# Patient Record
Sex: Male | Born: 1945
Health system: Southern US, Community
[De-identification: ages and names within clinical notes are randomized; demographics above are authoritative.]

## PROBLEM LIST (undated history)

## (undated) DIAGNOSIS — R5383 Other fatigue: Secondary | ICD-10-CM

## (undated) DIAGNOSIS — M5412 Radiculopathy, cervical region: Secondary | ICD-10-CM

## (undated) DIAGNOSIS — F1090 Alcohol use, unspecified, uncomplicated: Secondary | ICD-10-CM

## (undated) DIAGNOSIS — M545 Low back pain, unspecified: Secondary | ICD-10-CM

## (undated) DIAGNOSIS — R251 Tremor, unspecified: Secondary | ICD-10-CM

## (undated) DIAGNOSIS — N644 Mastodynia: Secondary | ICD-10-CM

## (undated) DIAGNOSIS — I10 Essential (primary) hypertension: Secondary | ICD-10-CM

## (undated) DIAGNOSIS — G459 Transient cerebral ischemic attack, unspecified: Secondary | ICD-10-CM

## (undated) DIAGNOSIS — R0600 Dyspnea, unspecified: Secondary | ICD-10-CM

## (undated) DIAGNOSIS — E7800 Pure hypercholesterolemia, unspecified: Secondary | ICD-10-CM

## (undated) DIAGNOSIS — Z8719 Personal history of other diseases of the digestive system: Secondary | ICD-10-CM

## (undated) DIAGNOSIS — J449 Chronic obstructive pulmonary disease, unspecified: Secondary | ICD-10-CM

## (undated) DIAGNOSIS — Z860101 Personal history of adenomatous and serrated colon polyps: Secondary | ICD-10-CM

## (undated) DIAGNOSIS — N390 Urinary tract infection, site not specified: Secondary | ICD-10-CM

## (undated) DIAGNOSIS — Z86010 Personal history of colonic polyps: Secondary | ICD-10-CM

## (undated) DIAGNOSIS — D126 Benign neoplasm of colon, unspecified: Secondary | ICD-10-CM

## (undated) DIAGNOSIS — M791 Myalgia, unspecified site: Secondary | ICD-10-CM

## (undated) DIAGNOSIS — R7303 Prediabetes: Secondary | ICD-10-CM

## (undated) DIAGNOSIS — F32A Depression, unspecified: Secondary | ICD-10-CM

## (undated) DIAGNOSIS — S2239XA Fracture of one rib, unspecified side, initial encounter for closed fracture: Secondary | ICD-10-CM

## (undated) DIAGNOSIS — E669 Obesity, unspecified: Secondary | ICD-10-CM

## (undated) DIAGNOSIS — M1990 Unspecified osteoarthritis, unspecified site: Secondary | ICD-10-CM

## (undated) DIAGNOSIS — Z720 Tobacco use: Secondary | ICD-10-CM

## (undated) DIAGNOSIS — R600 Localized edema: Secondary | ICD-10-CM

## (undated) DIAGNOSIS — E785 Hyperlipidemia, unspecified: Secondary | ICD-10-CM

## (undated) DIAGNOSIS — L97309 Non-pressure chronic ulcer of unspecified ankle with unspecified severity: Secondary | ICD-10-CM

## (undated) DIAGNOSIS — G5600 Carpal tunnel syndrome, unspecified upper limb: Secondary | ICD-10-CM

## (undated) DIAGNOSIS — K921 Melena: Secondary | ICD-10-CM

## (undated) DIAGNOSIS — M25551 Pain in right hip: Secondary | ICD-10-CM

## (undated) DIAGNOSIS — L298 Other pruritus: Secondary | ICD-10-CM

## (undated) DIAGNOSIS — D7589 Other specified diseases of blood and blood-forming organs: Secondary | ICD-10-CM

## (undated) DIAGNOSIS — R079 Chest pain, unspecified: Secondary | ICD-10-CM

## (undated) DIAGNOSIS — N400 Enlarged prostate without lower urinary tract symptoms: Secondary | ICD-10-CM

## (undated) DIAGNOSIS — L299 Pruritus, unspecified: Secondary | ICD-10-CM

## (undated) DIAGNOSIS — K625 Hemorrhage of anus and rectum: Secondary | ICD-10-CM

## (undated) DIAGNOSIS — R351 Nocturia: Secondary | ICD-10-CM

## (undated) DIAGNOSIS — L2989 Other pruritus: Secondary | ICD-10-CM

## (undated) DIAGNOSIS — I700 Atherosclerosis of aorta: Secondary | ICD-10-CM

## (undated) DIAGNOSIS — R197 Diarrhea, unspecified: Secondary | ICD-10-CM

## (undated) DIAGNOSIS — R61 Generalized hyperhidrosis: Secondary | ICD-10-CM

## (undated) DIAGNOSIS — R1030 Lower abdominal pain, unspecified: Secondary | ICD-10-CM

## (undated) DIAGNOSIS — H33311 Horseshoe tear of retina without detachment, right eye: Secondary | ICD-10-CM

## (undated) DIAGNOSIS — Z789 Other specified health status: Secondary | ICD-10-CM

## (undated) DIAGNOSIS — F419 Anxiety disorder, unspecified: Secondary | ICD-10-CM

## (undated) DIAGNOSIS — M792 Neuralgia and neuritis, unspecified: Secondary | ICD-10-CM

## (undated) DIAGNOSIS — K219 Gastro-esophageal reflux disease without esophagitis: Secondary | ICD-10-CM

## (undated) DIAGNOSIS — G4730 Sleep apnea, unspecified: Secondary | ICD-10-CM

## (undated) DIAGNOSIS — R0602 Shortness of breath: Secondary | ICD-10-CM

## (undated) HISTORY — DX: Myalgia, unspecified site: M79.10

## (undated) HISTORY — DX: Other specified diseases of blood and blood-forming organs: D75.89

## (undated) HISTORY — DX: Mastodynia: N64.4

## (undated) HISTORY — DX: Tremor, unspecified: R25.1

## (undated) HISTORY — DX: Hyperlipidemia, unspecified: E78.5

## (undated) HISTORY — DX: Prediabetes: R73.03

## (undated) HISTORY — DX: Sleep apnea, unspecified: G47.30

## (undated) HISTORY — DX: Carpal tunnel syndrome, unspecified upper limb: G56.00

## (undated) HISTORY — DX: Benign prostatic hyperplasia without lower urinary tract symptoms: N40.0

## (undated) HISTORY — PX: OTHER SURGICAL HISTORY: SHX169

## (undated) HISTORY — DX: Rider (driver) (passenger) of other motorcycle injured in unspecified traffic accident, initial encounter: V29.99XA

## (undated) HISTORY — DX: Diarrhea, unspecified: R19.7

## (undated) HISTORY — DX: Personal history of adenomatous and serrated colon polyps: Z86.0101

## (undated) HISTORY — DX: Pain in right hip: M25.551

## (undated) HISTORY — DX: Other pruritus: L29.89

## (undated) HISTORY — DX: Nocturia: R35.1

## (undated) HISTORY — DX: Transient cerebral ischemic attack, unspecified: G45.9

## (undated) HISTORY — DX: Other specified health status: Z78.9

## (undated) HISTORY — DX: Generalized hyperhidrosis: R61

## (undated) HISTORY — DX: Tobacco use: Z72.0

## (undated) HISTORY — DX: Radiculopathy, cervical region: M54.12

## (undated) HISTORY — DX: Pruritus, unspecified: L29.9

## (undated) HISTORY — DX: Gastro-esophageal reflux disease without esophagitis: K21.9

## (undated) HISTORY — DX: Hemorrhage of anus and rectum: K62.5

## (undated) HISTORY — DX: Localized edema: R60.0

## (undated) HISTORY — DX: Chest pain, unspecified: R07.9

## (undated) HISTORY — DX: Horseshoe tear of retina without detachment, right eye: H33.311

## (undated) HISTORY — DX: Chronic obstructive pulmonary disease, unspecified: J44.9

## (undated) HISTORY — DX: Other pruritus: L29.8

## (undated) HISTORY — DX: Depression, unspecified: F32.A

## (undated) HISTORY — PX: UMBILICAL HERNIA REPAIR: SHX196

## (undated) HISTORY — PX: EYE SURGERY: SHX253

## (undated) HISTORY — DX: Obesity, unspecified: E66.9

## (undated) HISTORY — DX: Atherosclerosis of aorta: I70.0

## (undated) HISTORY — DX: Urinary tract infection, site not specified: N39.0

## (undated) HISTORY — DX: Alcohol use, unspecified, uncomplicated: F10.90

## (undated) HISTORY — DX: Low back pain, unspecified: M54.50

## (undated) HISTORY — DX: Melena: K92.1

## (undated) HISTORY — DX: Fracture of one rib, unspecified side, initial encounter for closed fracture: S22.39XA

## (undated) HISTORY — DX: Non-pressure chronic ulcer of unspecified ankle with unspecified severity: L97.309

## (undated) HISTORY — DX: Pure hypercholesterolemia, unspecified: E78.00

## (undated) HISTORY — DX: Anxiety disorder, unspecified: F41.9

## (undated) HISTORY — DX: Lower abdominal pain, unspecified: R10.30

## (undated) HISTORY — DX: Other fatigue: R53.83

## (undated) HISTORY — DX: Shortness of breath: R06.02

## (undated) HISTORY — DX: Neuralgia and neuritis, unspecified: M79.2

## (undated) HISTORY — DX: Essential (primary) hypertension: I10

## (undated) HISTORY — DX: Benign neoplasm of colon, unspecified: D12.6

## (undated) HISTORY — DX: Personal history of colonic polyps: Z86.010

## (undated) SURGERY — Surgical Case
Anesthesia: *Unknown

---

## 2000-09-07 ENCOUNTER — Encounter: Payer: Self-pay | Admitting: Internal Medicine

## 2000-09-07 ENCOUNTER — Encounter: Admission: RE | Admit: 2000-09-07 | Discharge: 2000-09-07 | Payer: Self-pay | Admitting: Internal Medicine

## 2002-04-16 ENCOUNTER — Ambulatory Visit (HOSPITAL_COMMUNITY): Admission: RE | Admit: 2002-04-16 | Discharge: 2002-04-16 | Payer: Self-pay | Admitting: Gastroenterology

## 2002-04-16 ENCOUNTER — Encounter (INDEPENDENT_AMBULATORY_CARE_PROVIDER_SITE_OTHER): Payer: Self-pay

## 2010-07-14 ENCOUNTER — Ambulatory Visit (HOSPITAL_COMMUNITY)
Admission: RE | Admit: 2010-07-14 | Discharge: 2010-07-14 | Payer: Self-pay | Source: Home / Self Care | Admitting: Surgery

## 2010-12-10 LAB — CBC
HCT: 48.9 % (ref 39.0–52.0)
Hemoglobin: 17 g/dL (ref 13.0–17.0)
MCH: 33.7 pg (ref 26.0–34.0)
MCHC: 34.8 g/dL (ref 30.0–36.0)
MCV: 97 fL (ref 78.0–100.0)
Platelets: 190 10*3/uL (ref 150–400)
RBC: 5.04 MIL/uL (ref 4.22–5.81)
RDW: 13.8 % (ref 11.5–15.5)
WBC: 4.8 10*3/uL (ref 4.0–10.5)

## 2010-12-10 LAB — URINALYSIS, ROUTINE W REFLEX MICROSCOPIC
Bilirubin Urine: NEGATIVE
Glucose, UA: NEGATIVE mg/dL
Hgb urine dipstick: NEGATIVE
Ketones, ur: NEGATIVE mg/dL
Nitrite: NEGATIVE
Protein, ur: NEGATIVE mg/dL
Specific Gravity, Urine: 1.023 (ref 1.005–1.030)
Urobilinogen, UA: 0.2 mg/dL (ref 0.0–1.0)
pH: 5.5 (ref 5.0–8.0)

## 2010-12-10 LAB — DIFFERENTIAL
Basophils Absolute: 0 10*3/uL (ref 0.0–0.1)
Basophils Relative: 1 % (ref 0–1)
Eosinophils Absolute: 0.1 10*3/uL (ref 0.0–0.7)
Eosinophils Relative: 2 % (ref 0–5)
Lymphocytes Relative: 18 % (ref 12–46)
Lymphs Abs: 0.8 10*3/uL (ref 0.7–4.0)
Monocytes Absolute: 0.5 10*3/uL (ref 0.1–1.0)
Monocytes Relative: 11 % (ref 3–12)
Neutro Abs: 3.3 10*3/uL (ref 1.7–7.7)
Neutrophils Relative %: 69 % (ref 43–77)

## 2010-12-10 LAB — BASIC METABOLIC PANEL
BUN: 16 mg/dL (ref 6–23)
CO2: 27 mEq/L (ref 19–32)
Calcium: 9.4 mg/dL (ref 8.4–10.5)
Chloride: 105 mEq/L (ref 96–112)
Creatinine, Ser: 0.96 mg/dL (ref 0.4–1.5)
GFR calc Af Amer: >60 mL/min (ref >60)
GFR calc non Af Amer: >60 mL/min (ref >60)
Glucose, Bld: 105 mg/dL — ABNORMAL HIGH (ref 70–99)
Potassium: 4 mEq/L (ref 3.5–5.1)
Sodium: 140 mEq/L (ref 135–145)

## 2010-12-10 LAB — PROTIME-INR
INR: 1.02 (ref 0.00–1.49)
Prothrombin Time: 13.6 seconds (ref 11.6–15.2)

## 2010-12-10 LAB — SURGICAL PCR SCREEN: Staphylococcus aureus: NEGATIVE

## 2011-02-12 NOTE — Procedures (Signed)
[facility]  Patient:    Rodney [name], Rodney [name] Visit Number: [id number] MRN: [id number]          Service Type: END Location: ENDO Attending Physician:  Dennison Bulla Ii Dictated by:   Verlin Grills, M.D. Proc. Date: 04/16/02 Admit Date:  04/16/2002 Discharge Date: 04/16/2002   CC:         [name], M.D.   Procedure Report  PROCEDURE:  Colonoscopy with polypectomy.  REFERRING PHYSICIAN:  Barbette Hair. Vaughan Basta., M.D.  INDICATION FOR PROCEDURE:  The patient is a 65 year old male with unexplained weight loss. He is due for his first screening colonoscopy with polypectomy to prevent colon cancer.  I discussed with the patient the complications associated with colonoscopy and polypectomy including a 15/1000 risk of bleeding and 12/998 risk of colon perforation requiring surgical repair. The patient has signed the operative permit.  ENDOSCOPIST:  Verlin Grills, M.D.  PREMEDICATION:  Versed 7.5 mg, Demerol 50 mg.  ENDOSCOPE:  Olympus pediatric colonoscope.  DESCRIPTION OF PROCEDURE:  After obtaining informed consent, the patient was placed in the left lateral decubitus position. I administered intravenous Demerol and intravenous Versed to achieve conscious sedation for the procedure. The patients cardiac rhythm, oxygen saturation and blood pressure were monitored throughout the procedure and documented in the medical record.  Anal inspection was normal. Digital rectal exam revealed a non-nodular prostate. The Olympus pediatric video colonoscope was introduced into the rectum and easily advanced to the cecum. A normal appearing ileocecal valve was intubated and the distal ileum inspected. Colonic preparation for the exam today was excellent.  RECTUM:  Normal.  SIGMOID COLON/DESCENDING COLON:  Normal.  SPLENIC FLEXURE:  Normal.  TRANSVERSE COLON:  Normal.  HEPATIC FLEXURE:  Normal.  ASCENDING COLON:   Normal.  CECUM/ILEOCECAL VALVE:  From the proximal cecum, a 2 mm sessile polyp was removed with the electrocautery snare and submitted for pathological interpretation.  DISTAL ILEUM:  Normal.  ASSESSMENT:  From the proximal cecum, a 2 mm sessile polyp was removed; otherwise, normal proctocolonoscopy to the cecum and distal ileoscopy.  RECOMMENDATIONS:  If colonic polyp returns neoplastic pathologically, [name] should undergo a repeat colonoscopy in five years. Dictated by:   Verlin Grills, M.D. Attending Physician:  Dennison Bulla Ii DD:  04/16/02 TD:  04/20/02 Job: [id number]

## 2012-05-16 ENCOUNTER — Other Ambulatory Visit: Payer: Self-pay | Admitting: Gastroenterology

## 2015-02-04 ENCOUNTER — Other Ambulatory Visit: Payer: Self-pay | Admitting: Internal Medicine

## 2015-02-04 DIAGNOSIS — K921 Melena: Secondary | ICD-10-CM

## 2015-02-07 ENCOUNTER — Other Ambulatory Visit: Payer: Self-pay

## 2015-02-07 ENCOUNTER — Ambulatory Visit
Admission: RE | Admit: 2015-02-07 | Discharge: 2015-02-07 | Disposition: A | Discharge status: Home / Self Care | Payer: Medicare Other | Source: Ambulatory Visit | Attending: Internal Medicine | Admitting: Internal Medicine

## 2015-02-07 DIAGNOSIS — K921 Melena: Secondary | ICD-10-CM

## 2015-07-30 ENCOUNTER — Other Ambulatory Visit: Payer: Self-pay | Admitting: Gastroenterology

## 2015-07-31 ENCOUNTER — Other Ambulatory Visit: Payer: Self-pay | Admitting: Gastroenterology

## 2015-09-09 ENCOUNTER — Encounter (HOSPITAL_COMMUNITY): Payer: Self-pay | Admitting: *Deleted

## 2015-09-18 ENCOUNTER — Ambulatory Visit (HOSPITAL_COMMUNITY)
Admission: RE | Admit: 2015-09-18 | Discharge: 2015-09-18 | Disposition: A | Discharge status: Home / Self Care | Payer: Medicare Other | Source: Ambulatory Visit | Attending: Gastroenterology | Admitting: Gastroenterology

## 2015-09-18 ENCOUNTER — Encounter (HOSPITAL_COMMUNITY): Payer: Self-pay

## 2015-09-18 ENCOUNTER — Ambulatory Visit (HOSPITAL_COMMUNITY): Payer: Medicare Other | Admitting: Certified Registered"

## 2015-09-18 ENCOUNTER — Encounter (HOSPITAL_COMMUNITY)
Admission: RE | Disposition: A | Discharge status: Home / Self Care | Payer: Self-pay | Source: Ambulatory Visit | Attending: Gastroenterology

## 2015-09-18 ENCOUNTER — Ambulatory Visit (HOSPITAL_COMMUNITY): Admit: 2015-09-18 | Payer: Self-pay | Admitting: Gastroenterology

## 2015-09-18 DIAGNOSIS — Z6831 Body mass index (BMI) 31.0-31.9, adult: Secondary | ICD-10-CM | POA: Diagnosis not present

## 2015-09-18 DIAGNOSIS — G4730 Sleep apnea, unspecified: Secondary | ICD-10-CM | POA: Insufficient documentation

## 2015-09-18 DIAGNOSIS — G4733 Obstructive sleep apnea (adult) (pediatric): Secondary | ICD-10-CM | POA: Insufficient documentation

## 2015-09-18 DIAGNOSIS — Z86010 Personal history of colonic polyps: Secondary | ICD-10-CM | POA: Diagnosis not present

## 2015-09-18 DIAGNOSIS — E669 Obesity, unspecified: Secondary | ICD-10-CM | POA: Insufficient documentation

## 2015-09-18 DIAGNOSIS — E7800 Pure hypercholesterolemia, unspecified: Secondary | ICD-10-CM | POA: Diagnosis not present

## 2015-09-18 DIAGNOSIS — R197 Diarrhea, unspecified: Secondary | ICD-10-CM | POA: Diagnosis not present

## 2015-09-18 DIAGNOSIS — I10 Essential (primary) hypertension: Secondary | ICD-10-CM | POA: Diagnosis not present

## 2015-09-18 DIAGNOSIS — M5412 Radiculopathy, cervical region: Secondary | ICD-10-CM | POA: Diagnosis not present

## 2015-09-18 DIAGNOSIS — Z79899 Other long term (current) drug therapy: Secondary | ICD-10-CM | POA: Diagnosis not present

## 2015-09-18 HISTORY — PX: COLONOSCOPY WITH PROPOFOL: SHX5780

## 2015-09-18 SURGERY — COLONOSCOPY WITH PROPOFOL
Anesthesia: Monitor Anesthesia Care

## 2015-09-18 MED ORDER — LIDOCAINE HCL (CARDIAC) 20 MG/ML IV SOLN
INTRAVENOUS | Status: DC | PRN
  Administered 2015-09-18: 50 mg via INTRAVENOUS

## 2015-09-18 MED ORDER — PROPOFOL 10 MG/ML IV BOLUS
INTRAVENOUS | Status: DC | PRN
  Administered 2015-09-18: 50 mg via INTRAVENOUS
  Administered 2015-09-18: 100 mg via INTRAVENOUS
  Administered 2015-09-18: 50 mg via INTRAVENOUS
  Administered 2015-09-18: 20 mg via INTRAVENOUS

## 2015-09-18 MED ORDER — LACTATED RINGERS IV SOLN
INTRAVENOUS | Status: DC
  Administered 2015-09-18: 1000 mL via INTRAVENOUS

## 2015-09-18 MED ORDER — PROPOFOL 10 MG/ML IV BOLUS
INTRAVENOUS | Status: AC
  Filled 2015-09-18: qty 40

## 2015-09-18 MED ORDER — LIDOCAINE HCL (CARDIAC) 20 MG/ML IV SOLN
INTRAVENOUS | Status: AC
  Filled 2015-09-18: qty 5

## 2015-09-18 SURGICAL SUPPLY — 22 items

## 2015-09-18 NOTE — Anesthesia Postprocedure Evaluation (Signed)
Anesthesia Post Note  Patient: Rodney [name]  Procedure(s) Performed: Procedure(s) (LRB): COLONOSCOPY WITH PROPOFOL (N/A)  Patient location during evaluation: PACU Anesthesia Type: MAC Level of consciousness: awake and alert Pain management: pain level controlled Vital Signs Assessment: post-procedure vital signs reviewed and stable Respiratory status: spontaneous breathing, nonlabored ventilation, respiratory function stable and patient connected to nasal cannula oxygen Cardiovascular status: stable and blood pressure returned to baseline Anesthetic complications: no    Last Vitals:  Filed Vitals:   09/18/15 1020 09/18/15 1030  BP: 163/83 150/85  Pulse: 64 60  Temp:    Resp: 18 21    Last Pain: There were no vitals filed for this visit.               Catalina Gravel

## 2015-09-18 NOTE — Discharge Instructions (Signed)
Colonoscopy, Care After °Refer to this sheet in the next few weeks. These instructions provide you with information on caring for yourself after your procedure. Your health care provider may also give you more specific instructions. Your treatment has been planned according to current medical practices, but problems sometimes occur. Call your health care provider if you have any problems or questions after your procedure. °WHAT TO EXPECT AFTER THE PROCEDURE  °After your procedure, it is typical to have the following: °· A small amount of blood in your stool. °· Moderate amounts of gas and mild abdominal cramping or bloating. °HOME CARE INSTRUCTIONS °· Do not drive, operate machinery, or sign important documents for 24 hours. °· You may shower and resume your regular physical activities, but move at a slower pace for the first 24 hours. °· Take frequent rest periods for the first 24 hours. °· Walk around or put a warm pack on your abdomen to help reduce abdominal cramping and bloating. °· Drink enough fluids to keep your urine clear or pale yellow. °· You may resume your normal diet as instructed by your health care provider. Avoid heavy or fried foods that are hard to digest. °· Avoid drinking alcohol for 24 hours or as instructed by your health care provider. °· Only take over-the-counter or prescription medicines as directed by your health care provider. °· If a tissue sample (biopsy) was taken during your procedure: °¨ Do not take aspirin or blood thinners for 7 days, or as instructed by your health care provider. °¨ Do not drink alcohol for 7 days, or as instructed by your health care provider. °¨ Eat soft foods for the first 24 hours. °SEEK MEDICAL CARE IF: °You have persistent spotting of blood in your stool 2-3 days after the procedure. °SEEK IMMEDIATE MEDICAL CARE IF: °· You have more than a small spotting of blood in your stool. °· You pass large blood clots in your stool. °· Your abdomen is swollen  (distended). °· You have nausea or vomiting. °· You have a fever. °· You have increasing abdominal pain that is not relieved with medicine. °  °This information is not intended to replace advice given to you by your health care provider. Make sure you discuss any questions you have with your health care provider. °  °Document Released: 04/27/2004 Document Revised: 07/04/2013 Document Reviewed: 05/21/2013 °Elsevier Interactive Patient Education ©2016 [employer] ° °

## 2015-09-18 NOTE — H&P (Signed)
  Problem: Chronic watery diarrhea without weight loss. Normal CBC, complete metabolic profile, thyroid stimulating hormone level. Negative screen for C. difficile colitis. One stool negative for ova and parasite and Giardia antigen. No improvement in diarrhea symptoms after empiric Metronidazole therapy. 05/16/2012 colonoscopy performed with removal of a 7 mm tubular adenomatous sigmoid colon polyp  History: The patient is a 69 year old male born 22-Feb-1946. He has had watery, nonbloody diarrhea since February 2016. He typically has up to 4 watery bowel movements each morning but rarely has bowel movements in the afternoon and does not have nocturnal diarrhea. He denies abdominal pain, fever, weight loss, or gastrointestinal bleeding. He has not traveled except to [place]. He is scheduled to undergo diagnostic colonoscopy with random colon biopsies to look for microscopic colitis.  Past medical history: Umbilical hernia repair. Adenomatous colon polyps removed colonoscopically in 2013. Hypertension. Hypercholesterolemia. Obstructive sleep apnea syndrome. Brachial neuritis. Aortic calcifications.  Medication allergies: ACE inhibitors cause cough  Exam: The patient is alert and comfortably on the endoscopy stretcher. Abdomen is soft and nontender to palpation. Lungs are clear to auscultation. Cardiac exam reveals a regular rhythm.  Plan: Proceed with diagnostic colonoscopy with random colon biopsies to look for microscopic colitis

## 2015-09-18 NOTE — Transfer of Care (Signed)
Immediate Anesthesia Transfer of Care Note  Patient: Rodney [name]  Procedure(s) Performed: Procedure(s): COLONOSCOPY WITH PROPOFOL (N/A)  Patient Location: PACU  Anesthesia Type:MAC  Level of Consciousness: awake, alert  and oriented  Airway & Oxygen Therapy: Patient Spontanous Breathing and Patient connected to face mask oxygen  Post-op Assessment: Report given to RN and Post -op Vital signs reviewed and stable  Post vital signs: Reviewed and stable  Last Vitals:  Filed Vitals:   09/18/15 0920  BP: 185/93  Pulse: 67  Temp: 36.8 C  Resp: 14    Complications: No apparent anesthesia complications

## 2015-09-18 NOTE — Anesthesia Preprocedure Evaluation (Addendum)
Anesthesia Evaluation  Patient identified by MRN, date of birth, ID band Patient awake    Reviewed: Allergy & Precautions, NPO status , Patient's Chart, lab work & pertinent test results, reviewed documented beta blocker date and time   History of Anesthesia Complications Negative for: history of anesthetic complications  Airway Mallampati: III  TM Distance: >3 FB Neck ROM: Full    Dental  (+) Teeth Intact, Dental Advisory Given   Pulmonary sleep apnea ,    Pulmonary exam normal breath sounds clear to auscultation       Cardiovascular hypertension (took atenolol last PM), Pt. on medications and Pt. on home beta blockers (-) angina(-) Past MI Normal cardiovascular exam Rhythm:Regular Rate:Normal     Neuro/Psych  Neuromuscular disease    GI/Hepatic Neg liver ROS, Diarrhea   Endo/Other  Obesity   Renal/GU negative Renal ROS     Musculoskeletal negative musculoskeletal ROS (+)   Abdominal   Peds  Hematology negative hematology ROS (+)   Anesthesia Other Findings Day of surgery medications reviewed with the patient.  Reproductive/Obstetrics                            Anesthesia Physical Anesthesia Plan  ASA: II  Anesthesia Plan: MAC   Post-op Pain Management:    Induction: Intravenous  Airway Management Planned: Nasal Cannula  Additional Equipment:   Intra-op Plan:   Post-operative Plan:   Informed Consent: I have reviewed the patients History and Physical, chart, labs and discussed the procedure including the risks, benefits and alternatives for the proposed anesthesia with the patient or authorized representative who has indicated his/her understanding and acceptance.   Dental advisory given  Plan Discussed with: CRNA and Anesthesiologist  Anesthesia Plan Comments: (Discussed risks/benefits/alternatives to MAC sedation including need for ventilatory support, hypotension,  need for conversion to general anesthesia.  All patient questions answered.  Patient wished to proceed.)        Anesthesia Quick Evaluation

## 2015-09-18 NOTE — Op Note (Signed)
Problem: Chronic watery diarrhea without weight loss. Normal CBC, complete metabolic profile, and thyroid stimulating hormone level. Screen for C. difficile colitis negative. One stool negative for ova and parasite and Giardia antigen. 05/16/2012 colonoscopy performed with removal of a 7 mm tubular adenomatous sigmoid colon polyp  Endoscopist: Earle Gell  Premedication: Propofol administered by anesthesia  Procedure: Diagnostic colonoscopy with random colon biopsies performed to look for microscopic colitis The patient was placed in the left lateral decubitus position. Anal inspection and digital rectal exam were normal. The Pentax pediatric colonoscope was introduced into the rectum and advanced to the cecum. A normal-appearing appendiceal orifice and ileocecal valve were identified. Colonic preparation for the exam today was good. Withdrawal time was 10 minutes  Rectum. Normal. Retroflex view of the distal rectum was normal  Sigmoid colon and descending colon. Normal  Splenic flexure. Normal  Transverse colon. Normal  Hepatic flexure. Normal  Ascending colon. Normal  Cecum and ileocecal valve. Normal  Random colon biopsies. Random colon biopsies were performed from the ascending colon and descending colon to look for microscopic colitis  Assessment: Normal surveillance colonoscopy. Diagnostic random colon biopsies to look for microscopic colitis pending

## 2015-09-19 ENCOUNTER — Encounter (HOSPITAL_COMMUNITY): Payer: Self-pay | Admitting: Gastroenterology

## 2015-11-15 ENCOUNTER — Encounter (HOSPITAL_COMMUNITY): Payer: Self-pay | Admitting: *Deleted

## 2015-11-15 ENCOUNTER — Emergency Department (HOSPITAL_COMMUNITY): Payer: Medicare Other

## 2015-11-15 ENCOUNTER — Inpatient Hospital Stay (HOSPITAL_COMMUNITY)
Admission: EM | Admit: 2015-11-15 | Discharge: 2015-11-25 | DRG: 200 | Disposition: A | Discharge status: Home / Self Care | Payer: Medicare Other | Attending: General Surgery | Admitting: General Surgery

## 2015-11-15 DIAGNOSIS — E785 Hyperlipidemia, unspecified: Secondary | ICD-10-CM | POA: Diagnosis present

## 2015-11-15 DIAGNOSIS — R339 Retention of urine, unspecified: Secondary | ICD-10-CM | POA: Diagnosis present

## 2015-11-15 DIAGNOSIS — K567 Ileus, unspecified: Secondary | ICD-10-CM | POA: Diagnosis not present

## 2015-11-15 DIAGNOSIS — S299XXA Unspecified injury of thorax, initial encounter: Secondary | ICD-10-CM

## 2015-11-15 DIAGNOSIS — Z8241 Family history of sudden cardiac death: Secondary | ICD-10-CM

## 2015-11-15 DIAGNOSIS — R338 Other retention of urine: Secondary | ICD-10-CM | POA: Diagnosis not present

## 2015-11-15 DIAGNOSIS — R443 Hallucinations, unspecified: Secondary | ICD-10-CM | POA: Diagnosis not present

## 2015-11-15 DIAGNOSIS — Z8249 Family history of ischemic heart disease and other diseases of the circulatory system: Secondary | ICD-10-CM

## 2015-11-15 DIAGNOSIS — R109 Unspecified abdominal pain: Secondary | ICD-10-CM

## 2015-11-15 DIAGNOSIS — S42002A Fracture of unspecified part of left clavicle, initial encounter for closed fracture: Secondary | ICD-10-CM | POA: Diagnosis present

## 2015-11-15 DIAGNOSIS — S272XXA Traumatic hemopneumothorax, initial encounter: Secondary | ICD-10-CM | POA: Diagnosis present

## 2015-11-15 DIAGNOSIS — S270XXA Traumatic pneumothorax, initial encounter: Principal | ICD-10-CM | POA: Diagnosis present

## 2015-11-15 DIAGNOSIS — R0781 Pleurodynia: Secondary | ICD-10-CM | POA: Diagnosis present

## 2015-11-15 DIAGNOSIS — I10 Essential (primary) hypertension: Secondary | ICD-10-CM | POA: Diagnosis present

## 2015-11-15 DIAGNOSIS — Z7982 Long term (current) use of aspirin: Secondary | ICD-10-CM | POA: Diagnosis not present

## 2015-11-15 DIAGNOSIS — S2242XA Multiple fractures of ribs, left side, initial encounter for closed fracture: Secondary | ICD-10-CM | POA: Diagnosis present

## 2015-11-15 DIAGNOSIS — S22009A Unspecified fracture of unspecified thoracic vertebra, initial encounter for closed fracture: Secondary | ICD-10-CM | POA: Diagnosis present

## 2015-11-15 DIAGNOSIS — N390 Urinary tract infection, site not specified: Secondary | ICD-10-CM | POA: Diagnosis not present

## 2015-11-15 DIAGNOSIS — R079 Chest pain, unspecified: Secondary | ICD-10-CM | POA: Diagnosis present

## 2015-11-15 DIAGNOSIS — Z8052 Family history of malignant neoplasm of bladder: Secondary | ICD-10-CM | POA: Diagnosis not present

## 2015-11-15 DIAGNOSIS — Z888 Allergy status to other drugs, medicaments and biological substances status: Secondary | ICD-10-CM

## 2015-11-15 DIAGNOSIS — Z9689 Presence of other specified functional implants: Secondary | ICD-10-CM

## 2015-11-15 DIAGNOSIS — G4730 Sleep apnea, unspecified: Secondary | ICD-10-CM | POA: Diagnosis present

## 2015-11-15 DIAGNOSIS — R0602 Shortness of breath: Secondary | ICD-10-CM | POA: Diagnosis present

## 2015-11-15 DIAGNOSIS — S2249XA Multiple fractures of ribs, unspecified side, initial encounter for closed fracture: Secondary | ICD-10-CM | POA: Diagnosis present

## 2015-11-15 DIAGNOSIS — S42102A Fracture of unspecified part of scapula, left shoulder, initial encounter for closed fracture: Secondary | ICD-10-CM | POA: Diagnosis present

## 2015-11-15 DIAGNOSIS — J939 Pneumothorax, unspecified: Secondary | ICD-10-CM

## 2015-11-15 DIAGNOSIS — G5622 Lesion of ulnar nerve, left upper limb: Secondary | ICD-10-CM | POA: Diagnosis present

## 2015-11-15 DIAGNOSIS — S270XXD Traumatic pneumothorax, subsequent encounter: Secondary | ICD-10-CM

## 2015-11-15 DIAGNOSIS — S42142A Displaced fracture of glenoid cavity of scapula, left shoulder, initial encounter for closed fracture: Secondary | ICD-10-CM

## 2015-11-15 DIAGNOSIS — S42152A Displaced fracture of neck of scapula, left shoulder, initial encounter for closed fracture: Secondary | ICD-10-CM

## 2015-11-15 LAB — CBC
HCT: 45 % (ref 39.0–52.0)
Hemoglobin: 15.1 g/dL (ref 13.0–17.0)
MCH: 32.8 pg (ref 26.0–34.0)
MCHC: 33.6 g/dL (ref 30.0–36.0)
MCV: 97.8 fL (ref 78.0–100.0)
PLATELETS: 242 10*3/uL (ref 150–400)
RBC: 4.6 MIL/uL (ref 4.22–5.81)
RDW: 14.3 % (ref 11.5–15.5)
WBC: 20.9 10*3/uL — AB (ref 4.0–10.5)

## 2015-11-15 LAB — PROTIME-INR
INR: 1.13 (ref 0.00–1.49)
Prothrombin Time: 14.7 seconds (ref 11.6–15.2)

## 2015-11-15 LAB — COMPREHENSIVE METABOLIC PANEL
ALK PHOS: 50 U/L (ref 38–126)
ALT: 42 U/L (ref 17–63)
AST: 52 U/L — AB (ref 15–41)
Albumin: 3.7 g/dL (ref 3.5–5.0)
Anion gap: 12 (ref 5–15)
BILIRUBIN TOTAL: 0.8 mg/dL (ref 0.3–1.2)
BUN: 15 mg/dL (ref 6–20)
CALCIUM: 8.6 mg/dL — AB (ref 8.9–10.3)
CO2: 23 mmol/L (ref 22–32)
CREATININE: 1.11 mg/dL (ref 0.61–1.24)
Chloride: 101 mmol/L (ref 101–111)
Glucose, Bld: 151 mg/dL — ABNORMAL HIGH (ref 65–99)
Potassium: 4.1 mmol/L (ref 3.5–5.1)
Sodium: 136 mmol/L (ref 135–145)
Total Protein: 6.7 g/dL (ref 6.5–8.1)

## 2015-11-15 LAB — ETHANOL

## 2015-11-15 LAB — SAMPLE TO BLOOD BANK

## 2015-11-15 LAB — MRSA PCR SCREENING: MRSA by PCR: NEGATIVE

## 2015-11-15 LAB — CDS SEROLOGY

## 2015-11-15 MED ORDER — PANTOPRAZOLE SODIUM 40 MG PO TBEC: 40.0000 mg | DELAYED_RELEASE_TABLET | Freq: Every day | ORAL | Status: DC

## 2015-11-15 MED ORDER — ATENOLOL 25 MG PO TABS
25.0000 mg | ORAL_TABLET | Freq: Every day | ORAL | Status: DC
  Administered 2015-11-15 – 2015-11-24 (×10): 25 mg via ORAL
  Filled 2015-11-15 (×10): qty 1

## 2015-11-15 MED ORDER — IPRATROPIUM-ALBUTEROL 0.5-2.5 (3) MG/3ML IN SOLN
3.0000 mL | Freq: Four times a day (QID) | RESPIRATORY_TRACT | Status: DC
  Administered 2015-11-15 – 2015-11-16 (×3): 3 mL via RESPIRATORY_TRACT
  Filled 2015-11-15 (×2): qty 3

## 2015-11-15 MED ORDER — FENTANYL CITRATE (PF) 100 MCG/2ML IJ SOLN
50.0000 ug | INTRAMUSCULAR | Status: DC | PRN
Start: 2015-11-15 — End: 2015-11-15
  Administered 2015-11-15: 50 ug via INTRAVENOUS
  Filled 2015-11-15: qty 2

## 2015-11-15 MED ORDER — CETYLPYRIDINIUM CHLORIDE 0.05 % MT LIQD
7.0000 mL | Freq: Two times a day (BID) | OROMUCOSAL | Status: DC
  Administered 2015-11-16 – 2015-11-22 (×10): 7 mL via OROMUCOSAL

## 2015-11-15 MED ORDER — IPRATROPIUM-ALBUTEROL 0.5-2.5 (3) MG/3ML IN SOLN
RESPIRATORY_TRACT | Status: AC
  Administered 2015-11-15: 3 mL via RESPIRATORY_TRACT
  Filled 2015-11-15: qty 3

## 2015-11-15 MED ORDER — PANTOPRAZOLE SODIUM 40 MG IV SOLR: 40.0000 mg | Freq: Every day | INTRAVENOUS | Status: DC

## 2015-11-15 MED ORDER — DULOXETINE HCL 60 MG PO CPEP
60.0000 mg | ORAL_CAPSULE | Freq: Every day | ORAL | Status: DC
  Administered 2015-11-15 – 2015-11-24 (×10): 60 mg via ORAL
  Filled 2015-11-15 (×10): qty 1

## 2015-11-15 MED ORDER — MIDAZOLAM HCL 2 MG/2ML IJ SOLN
INTRAMUSCULAR | Status: AC
  Filled 2015-11-15: qty 2

## 2015-11-15 MED ORDER — LIDOCAINE-EPINEPHRINE (PF) 2 %-1:200000 IJ SOLN
20.0000 mL | Freq: Once | INTRAMUSCULAR | Status: AC
  Administered 2015-11-15: 20 mL via INTRADERMAL
  Filled 2015-11-15: qty 20

## 2015-11-15 MED ORDER — IRBESARTAN 300 MG PO TABS
300.0000 mg | ORAL_TABLET | Freq: Every day | ORAL | Status: DC
  Administered 2015-11-17 – 2015-11-25 (×9): 300 mg via ORAL
  Filled 2015-11-15 (×10): qty 1

## 2015-11-15 MED ORDER — ONDANSETRON HCL 4 MG/2ML IJ SOLN
4.0000 mg | INTRAMUSCULAR | Status: DC | PRN
  Administered 2015-11-15: 4 mg via INTRAVENOUS
  Filled 2015-11-15: qty 2

## 2015-11-15 MED ORDER — MIDAZOLAM HCL 2 MG/2ML IJ SOLN
2.0000 mg | Freq: Once | INTRAMUSCULAR | Status: AC
  Administered 2015-11-15: 2 mg via INTRAVENOUS

## 2015-11-15 MED ORDER — VALSARTAN-HYDROCHLOROTHIAZIDE 320-12.5 MG PO TABS: 1.0000 | ORAL_TABLET | Freq: Every day | ORAL | Status: DC

## 2015-11-15 MED ORDER — PANTOPRAZOLE SODIUM 40 MG PO TBEC
40.0000 mg | DELAYED_RELEASE_TABLET | Freq: Every day | ORAL | Status: DC
  Administered 2015-11-16 – 2015-11-25 (×10): 40 mg via ORAL
  Filled 2015-11-15 (×10): qty 1

## 2015-11-15 MED ORDER — OXYCODONE HCL 5 MG PO TABS
5.0000 mg | ORAL_TABLET | ORAL | Status: DC | PRN
  Administered 2015-11-15 – 2015-11-18 (×3): 5 mg via ORAL
  Filled 2015-11-15 (×3): qty 1

## 2015-11-15 MED ORDER — CLONIDINE HCL 0.1 MG PO TABS
0.1000 mg | ORAL_TABLET | Freq: Every day | ORAL | Status: DC
  Administered 2015-11-15 – 2015-11-24 (×10): 0.1 mg via ORAL
  Filled 2015-11-15 (×10): qty 1

## 2015-11-15 MED ORDER — HYDROCHLOROTHIAZIDE 12.5 MG PO CAPS
12.5000 mg | ORAL_CAPSULE | Freq: Every day | ORAL | Status: DC
  Administered 2015-11-17 – 2015-11-25 (×9): 12.5 mg via ORAL
  Filled 2015-11-15 (×9): qty 1

## 2015-11-15 MED ORDER — SODIUM CHLORIDE 0.9 % IV BOLUS (SEPSIS): 125.0000 mL | Freq: Once | INTRAVENOUS | Status: DC

## 2015-11-15 MED ORDER — ONDANSETRON HCL 4 MG/2ML IJ SOLN
4.0000 mg | Freq: Four times a day (QID) | INTRAMUSCULAR | Status: DC | PRN
Start: 2015-11-15 — End: 2015-11-25

## 2015-11-15 MED ORDER — FENTANYL CITRATE (PF) 100 MCG/2ML IJ SOLN
50.0000 ug | INTRAMUSCULAR | Status: DC | PRN
  Administered 2015-11-15 – 2015-11-18 (×13): 100 ug via INTRAVENOUS
  Filled 2015-11-15 (×13): qty 2

## 2015-11-15 MED ORDER — AMLODIPINE BESYLATE 10 MG PO TABS
10.0000 mg | ORAL_TABLET | Freq: Every day | ORAL | Status: DC
  Administered 2015-11-17 – 2015-11-25 (×9): 10 mg via ORAL
  Filled 2015-11-15 (×9): qty 1

## 2015-11-15 MED ORDER — POLYVINYL ALCOHOL 1.4 % OP SOLN
1.0000 [drp] | Freq: Three times a day (TID) | OPHTHALMIC | Status: DC | PRN
Start: 2015-11-15 — End: 2015-11-25
  Filled 2015-11-15: qty 15

## 2015-11-15 MED ORDER — OXYCODONE HCL 5 MG PO TABS
10.0000 mg | ORAL_TABLET | ORAL | Status: DC | PRN
  Administered 2015-11-15 – 2015-11-18 (×9): 10 mg via ORAL
  Filled 2015-11-15 (×9): qty 2

## 2015-11-15 MED ORDER — IOHEXOL 300 MG/ML  SOLN
100.0000 mL | Freq: Once | INTRAMUSCULAR | Status: AC | PRN
  Administered 2015-11-15: 100 mL via INTRAVENOUS

## 2015-11-15 MED ORDER — ALPRAZOLAM 0.25 MG PO TABS: 0.2500 mg | ORAL_TABLET | Freq: Every evening | ORAL | Status: DC | PRN

## 2015-11-15 MED ORDER — POLYETHYL GLYCOL-PROPYL GLYCOL 0.4-0.3 % OP GEL: 1.0000 "application " | Freq: Three times a day (TID) | OPHTHALMIC | Status: DC | PRN

## 2015-11-15 MED ORDER — ONDANSETRON HCL 4 MG PO TABS: 4.0000 mg | ORAL_TABLET | Freq: Four times a day (QID) | ORAL | Status: DC | PRN

## 2015-11-15 MED ORDER — KCL IN DEXTROSE-NACL 20-5-0.45 MEQ/L-%-% IV SOLN
INTRAVENOUS | Status: DC
  Administered 2015-11-15 – 2015-11-17 (×4): via INTRAVENOUS
  Filled 2015-11-15 (×4): qty 1000

## 2015-11-15 NOTE — ED Notes (Signed)
Trauma MD at bedside.
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
## 2015-11-15 NOTE — ED Notes (Signed)
Pt transported to CT with RN on monitor
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
## 2015-11-15 NOTE — Progress Notes (Signed)
Orthopedic Tech Progress Note Patient Details:  Rodney [name] 01/09/1946 HC:[id number]  Ortho Devices Type of Ortho Device: Arm sling Ortho Device/Splint Interventions: Application   [place] Pink 11/15/2015, 12:31 PM
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
## 2015-11-15 NOTE — ED Notes (Signed)
4L applied O2
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
## 2015-11-15 NOTE — ED Notes (Signed)
MD at bedside. 

## 2015-11-15 NOTE — ED Notes (Signed)
X-ray at bedside
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
## 2015-11-15 NOTE — ED Notes (Signed)
1 L NS ruunning, verbal per [employer]

## 2015-11-15 NOTE — Procedures (Signed)
Chest Tube Insertion Procedure Note  Pre-operative Diagnosis: L Pneumothorax  Post-operative Diagnosis:  L Pneumothorax  Procedure Details  Informed consent was obtained for the procedure, including sedation.  Risks of lung perforation, hemorrhage, arrhythmia, and adverse drug reaction were discussed.   After sterile skin prep, using standard technique, a 20 French tube was placed in the left anterior axillary line, nipple level. Sutured in place.  Findings: large rush of air  Estimated Blood Loss:  less than 100 mL         Specimens:  None              Complications:  None; patient tolerated the procedure well.         Condition: stable  Georganna Skeans, MD, MPH, FACS Trauma: [phone] General Surgery: [phone]

## 2015-11-15 NOTE — ED Notes (Signed)
Chest tube placed, left side via MD Georganna Skeans.  Pt tolerated well.

## 2015-11-15 NOTE — ED Notes (Signed)
Attempted report 

## 2015-11-15 NOTE — ED Notes (Signed)
Md aware pt O2 83-88 on RA,  3L applied, O2 89-92%.

## 2015-11-16 ENCOUNTER — Inpatient Hospital Stay (HOSPITAL_COMMUNITY): Payer: Medicare Other

## 2015-11-16 LAB — BASIC METABOLIC PANEL
ANION GAP: 11 (ref 5–15)
BUN: 16 mg/dL (ref 6–20)
CALCIUM: 8.5 mg/dL — AB (ref 8.9–10.3)
CHLORIDE: 102 mmol/L (ref 101–111)
CO2: 25 mmol/L (ref 22–32)
CREATININE: 0.95 mg/dL (ref 0.61–1.24)
GFR calc non Af Amer: >60 mL/min (ref >60)
GLUCOSE: 135 mg/dL — AB (ref 65–99)
Potassium: 4.3 mmol/L (ref 3.5–5.1)
Sodium: 138 mmol/L (ref 135–145)

## 2015-11-16 LAB — CBC
HEMATOCRIT: 39.1 % (ref 39.0–52.0)
HEMOGLOBIN: 12.8 g/dL — AB (ref 13.0–17.0)
MCH: 32.5 pg (ref 26.0–34.0)
MCHC: 32.7 g/dL (ref 30.0–36.0)
MCV: 99.2 fL (ref 78.0–100.0)
Platelets: 218 10*3/uL (ref 150–400)
RBC: 3.94 MIL/uL — ABNORMAL LOW (ref 4.22–5.81)
RDW: 14.4 % (ref 11.5–15.5)
WBC: 9.1 10*3/uL (ref 4.0–10.5)

## 2015-11-16 MED ORDER — KETOROLAC TROMETHAMINE 30 MG/ML IJ SOLN
15.0000 mg | Freq: Three times a day (TID) | INTRAMUSCULAR | Status: AC
  Administered 2015-11-16 – 2015-11-18 (×5): 15 mg via INTRAVENOUS
  Filled 2015-11-16 (×5): qty 1

## 2015-11-16 MED ORDER — TAMSULOSIN HCL 0.4 MG PO CAPS
0.4000 mg | ORAL_CAPSULE | Freq: Every day | ORAL | Status: DC
  Administered 2015-11-16 – 2015-11-20 (×5): 0.4 mg via ORAL
  Filled 2015-11-16 (×5): qty 1

## 2015-11-16 MED ORDER — KETOROLAC TROMETHAMINE 30 MG/ML IJ SOLN
30.0000 mg | Freq: Three times a day (TID) | INTRAMUSCULAR | Status: DC
  Administered 2015-11-16: 30 mg via INTRAVENOUS
  Filled 2015-11-16: qty 1

## 2015-11-16 MED ORDER — ENOXAPARIN SODIUM 40 MG/0.4ML ~~LOC~~ SOLN
40.0000 mg | SUBCUTANEOUS | Status: DC
  Administered 2015-11-16 – 2015-11-18 (×3): 40 mg via SUBCUTANEOUS
  Filled 2015-11-16 (×3): qty 0.4

## 2015-11-16 MED ORDER — IPRATROPIUM-ALBUTEROL 0.5-2.5 (3) MG/3ML IN SOLN
3.0000 mL | Freq: Three times a day (TID) | RESPIRATORY_TRACT | Status: DC
  Administered 2015-11-16 – 2015-11-19 (×7): 3 mL via RESPIRATORY_TRACT
  Filled 2015-11-16 (×7): qty 3

## 2015-11-16 MED ORDER — BETHANECHOL CHLORIDE 10 MG PO TABS
10.0000 mg | ORAL_TABLET | Freq: Three times a day (TID) | ORAL | Status: DC
  Administered 2015-11-16 – 2015-11-20 (×12): 10 mg via ORAL
  Filled 2015-11-16 (×12): qty 1

## 2015-11-16 MED ORDER — IPRATROPIUM-ALBUTEROL 0.5-2.5 (3) MG/3ML IN SOLN
3.0000 mL | Freq: Four times a day (QID) | RESPIRATORY_TRACT | Status: DC
  Administered 2015-11-16 (×2): 3 mL via RESPIRATORY_TRACT
  Filled 2015-11-16 (×2): qty 3
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
## 2015-11-16 NOTE — Progress Notes (Signed)
Patient ID: Rodney [name], male   DOB: 1946-05-03, 70 y.o.   MRN: [id number] Will order CT scan of the left shoulder to evaluate the extent of his scapula fracture.  Can work on motion of left elbow and wrist, but not shoulder for now.  PT currently at beside.  Don't want him to put weight thru the shoulder for now.  He is NVI with the left arm.
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
## 2015-11-17 ENCOUNTER — Inpatient Hospital Stay (HOSPITAL_COMMUNITY): Payer: Medicare Other

## 2015-11-17 LAB — CBC
HEMATOCRIT: 35.3 % — AB (ref 39.0–52.0)
Hemoglobin: 11.3 g/dL — ABNORMAL LOW (ref 13.0–17.0)
MCH: 32 pg (ref 26.0–34.0)
MCHC: 32 g/dL (ref 30.0–36.0)
MCV: 100 fL (ref 78.0–100.0)
Platelets: 186 10*3/uL (ref 150–400)
RBC: 3.53 MIL/uL — ABNORMAL LOW (ref 4.22–5.81)
RDW: 14.3 % (ref 11.5–15.5)
WBC: 9.1 10*3/uL (ref 4.0–10.5)

## 2015-11-17 NOTE — Progress Notes (Signed)
Patient ID: Rodney [name], male   DOB: January 02, 1946, 70 y.o.   MRN: [id number] I reviewed the left shoulder dedicated CT scan.  Will continue non-operative treatment for now.  The main fracture involves the inferior-posterior glenoid and his shoulder appears well located and not subluxed.  May end up with non-operative treatment for this injury.
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
## 2015-11-18 ENCOUNTER — Inpatient Hospital Stay (HOSPITAL_COMMUNITY): Payer: Medicare Other

## 2015-11-18 LAB — URINE MICROSCOPIC-ADD ON

## 2015-11-18 LAB — URINALYSIS, ROUTINE W REFLEX MICROSCOPIC
Bilirubin Urine: NEGATIVE
Glucose, UA: NEGATIVE mg/dL
KETONES UR: NEGATIVE mg/dL
NITRITE: NEGATIVE
PROTEIN: NEGATIVE mg/dL
Specific Gravity, Urine: 1.015 (ref 1.005–1.030)
pH: 7.5 (ref 5.0–8.0)

## 2015-11-18 LAB — BASIC METABOLIC PANEL
ANION GAP: 11 (ref 5–15)
BUN: 21 mg/dL — ABNORMAL HIGH (ref 6–20)
CALCIUM: 8.6 mg/dL — AB (ref 8.9–10.3)
CO2: 23 mmol/L (ref 22–32)
Chloride: 99 mmol/L — ABNORMAL LOW (ref 101–111)
Creatinine, Ser: 0.98 mg/dL (ref 0.61–1.24)
Glucose, Bld: 116 mg/dL — ABNORMAL HIGH (ref 65–99)
Potassium: 4.6 mmol/L (ref 3.5–5.1)
Sodium: 133 mmol/L — ABNORMAL LOW (ref 135–145)

## 2015-11-18 LAB — CBC
HEMATOCRIT: 33.2 % — AB (ref 39.0–52.0)
Hemoglobin: 11 g/dL — ABNORMAL LOW (ref 13.0–17.0)
MCH: 32.5 pg (ref 26.0–34.0)
MCHC: 33.1 g/dL (ref 30.0–36.0)
MCV: 98.2 fL (ref 78.0–100.0)
PLATELETS: 211 10*3/uL (ref 150–400)
RBC: 3.38 MIL/uL — ABNORMAL LOW (ref 4.22–5.81)
RDW: 14.1 % (ref 11.5–15.5)
WBC: 10.7 10*3/uL — AB (ref 4.0–10.5)

## 2015-11-18 MED ORDER — OXYCODONE HCL 5 MG PO TABS
5.0000 mg | ORAL_TABLET | ORAL | Status: DC | PRN
  Administered 2015-11-18: 5 mg via ORAL
  Administered 2015-11-19: 15 mg via ORAL
  Administered 2015-11-19 (×2): 10 mg via ORAL
  Administered 2015-11-20: 15 mg via ORAL
  Administered 2015-11-21 (×2): 10 mg via ORAL
  Administered 2015-11-21: 15 mg via ORAL
  Administered 2015-11-22: 10 mg via ORAL
  Filled 2015-11-18 (×2): qty 2
  Filled 2015-11-18: qty 3
  Filled 2015-11-18: qty 1
  Filled 2015-11-18: qty 2
  Filled 2015-11-18 (×2): qty 3
  Filled 2015-11-18 (×2): qty 2

## 2015-11-18 MED ORDER — HYDROMORPHONE HCL 1 MG/ML IJ SOLN: 1.0000 mg | INTRAMUSCULAR | Status: DC | PRN

## 2015-11-18 MED ORDER — SODIUM CHLORIDE 0.9% FLUSH
10.0000 mL | INTRAVENOUS | Status: DC | PRN
  Administered 2015-11-21: 10 mL
  Administered 2015-11-23: 20 mL
  Filled 2015-11-18 (×2): qty 40

## 2015-11-18 MED ORDER — NAPROXEN 250 MG PO TABS
500.0000 mg | ORAL_TABLET | Freq: Two times a day (BID) | ORAL | Status: DC
Start: 2015-11-18 — End: 2015-11-25
  Administered 2015-11-18 – 2015-11-25 (×14): 500 mg via ORAL
  Filled 2015-11-18 (×15): qty 2

## 2015-11-18 MED ORDER — TRAMADOL HCL 50 MG PO TABS
100.0000 mg | ORAL_TABLET | Freq: Four times a day (QID) | ORAL | Status: DC
Start: 2015-11-18 — End: 2015-11-20
  Administered 2015-11-18 – 2015-11-20 (×8): 100 mg via ORAL
  Filled 2015-11-18 (×8): qty 2

## 2015-11-18 NOTE — Care Management Important Message (Signed)
Important Message  Patient Details  Name: Rodney [name] MRN: [id number] Date of Birth: 1946/08/24   Medicare Important Message Given:  Yes    Nathen May 11/18/2015, 2:26 PM
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
## 2015-11-18 NOTE — Progress Notes (Signed)
Pt began to desat around 0300 after pt tried to get up and urinate. Patient was on nasal cannula at 3L. Nurse assisted patient with getting cleaned up and back in the bed. After repositioning and getting back in the bed pt began to desat to the 80's. VSS are stable, patient states that he is a little short of breath from moving around in the bed. Nurse raised O2 to 6L, pt stayed in the 80's. Nurse put the pt on the Venturi Mask at 8L and pt is currently satting at 94%. Currently pt is unable to tolerate the removal of the mask.  Julene Rahn,RN  11/18/2015

## 2015-11-18 NOTE — Progress Notes (Signed)
Peripherally Inserted Central Catheter/Midline Placement  The IV Nurse has discussed with the patient and/or persons authorized to consent for the patient, the purpose of this procedure and the potential benefits and risks involved with this procedure.  The benefits include less needle sticks, lab draws from the catheter and patient may be discharged home with the catheter.  Risks include, but not limited to, infection, bleeding, blood clot (thrombus formation), and puncture of an artery; nerve damage and irregular heat beat.  Alternatives to this procedure were also discussed.  PICC/Midline Placement Documentation  PICC Single Lumen [id number] PICC Right Basilic 45 cm 0 cm (Active)  Indication for Insertion or Continuance of Line Limited venous access - need for IV therapy >5 days (PICC only);Poor Vasculature-patient has had multiple peripheral attempts or PIVs lasting less than 24 hours 11/18/2015  4:27 PM  Exposed Catheter (cm) 0 cm 11/18/2015  4:27 PM  Dressing Change Due 11/25/15 11/18/2015  4:27 PM    1515: Patient A&O but wife signed consent per patient preference.    Catalina Pizza 11/18/2015, 4:27 PM

## 2015-11-19 ENCOUNTER — Inpatient Hospital Stay (HOSPITAL_COMMUNITY): Payer: Medicare Other

## 2015-11-19 DIAGNOSIS — S22009A Unspecified fracture of unspecified thoracic vertebra, initial encounter for closed fracture: Secondary | ICD-10-CM | POA: Diagnosis present

## 2015-11-19 DIAGNOSIS — R339 Retention of urine, unspecified: Secondary | ICD-10-CM | POA: Diagnosis present

## 2015-11-19 DIAGNOSIS — S42102A Fracture of unspecified part of scapula, left shoulder, initial encounter for closed fracture: Secondary | ICD-10-CM | POA: Diagnosis present

## 2015-11-19 DIAGNOSIS — S42002A Fracture of unspecified part of left clavicle, initial encounter for closed fracture: Secondary | ICD-10-CM | POA: Diagnosis present

## 2015-11-19 DIAGNOSIS — S272XXA Traumatic hemopneumothorax, initial encounter: Secondary | ICD-10-CM | POA: Diagnosis present

## 2015-11-19 DIAGNOSIS — N390 Urinary tract infection, site not specified: Secondary | ICD-10-CM | POA: Diagnosis not present

## 2015-11-19 MED ORDER — DOCUSATE SODIUM 100 MG PO CAPS
100.0000 mg | ORAL_CAPSULE | Freq: Two times a day (BID) | ORAL | Status: DC
  Administered 2015-11-19 – 2015-11-21 (×5): 100 mg via ORAL
  Filled 2015-11-19 (×6): qty 1

## 2015-11-19 MED ORDER — POLYETHYLENE GLYCOL 3350 17 G PO PACK
17.0000 g | PACK | Freq: Every day | ORAL | Status: DC
  Administered 2015-11-19 – 2015-11-24 (×6): 17 g via ORAL
  Filled 2015-11-19 (×7): qty 1

## 2015-11-19 MED ORDER — ENOXAPARIN SODIUM 30 MG/0.3ML ~~LOC~~ SOLN
30.0000 mg | Freq: Two times a day (BID) | SUBCUTANEOUS | Status: DC
  Administered 2015-11-19 – 2015-11-24 (×12): 30 mg via SUBCUTANEOUS
  Filled 2015-11-19 (×12): qty 0.3

## 2015-11-19 MED ORDER — SULFAMETHOXAZOLE-TRIMETHOPRIM 800-160 MG PO TABS
1.0000 | ORAL_TABLET | Freq: Two times a day (BID) | ORAL | Status: DC
  Administered 2015-11-19 – 2015-11-23 (×10): 1 via ORAL
  Filled 2015-11-19 (×10): qty 1

## 2015-11-19 MED ORDER — IPRATROPIUM-ALBUTEROL 0.5-2.5 (3) MG/3ML IN SOLN: 3.0000 mL | RESPIRATORY_TRACT | Status: DC | PRN

## 2015-11-19 MED ORDER — HYDROMORPHONE HCL 1 MG/ML IJ SOLN
0.5000 mg | INTRAMUSCULAR | Status: DC | PRN
  Filled 2015-11-19: qty 1

## 2015-11-19 MED ORDER — MUSCLE RUB 10-15 % EX CREA
TOPICAL_CREAM | CUTANEOUS | Status: DC | PRN
Start: 2015-11-19 — End: 2015-11-25
  Administered 2015-11-21: 1 via TOPICAL
  Filled 2015-11-19 (×2): qty 85
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
## 2015-11-20 ENCOUNTER — Inpatient Hospital Stay (HOSPITAL_COMMUNITY): Payer: Medicare Other

## 2015-11-20 DIAGNOSIS — G5622 Lesion of ulnar nerve, left upper limb: Secondary | ICD-10-CM | POA: Diagnosis present

## 2015-11-20 DIAGNOSIS — R338 Other retention of urine: Secondary | ICD-10-CM | POA: Diagnosis not present

## 2015-11-20 MED ORDER — PREGABALIN 75 MG PO CAPS
75.0000 mg | ORAL_CAPSULE | Freq: Two times a day (BID) | ORAL | Status: DC
  Administered 2015-11-20 – 2015-11-23 (×8): 75 mg via ORAL
  Filled 2015-11-20 (×8): qty 1

## 2015-11-20 NOTE — Progress Notes (Signed)
Physician notified: Hulen Skains At: 1722  Regarding: Pt unable to void. Bladder scan shows 436ml in bladder, no complaints of pain or pressure. No order for I&O or foley.  Awaiting return response.   Returned Response at: 1725  Order(s): Straight cath every 6-8 hours as needed for retention.
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
## 2015-11-21 ENCOUNTER — Inpatient Hospital Stay (HOSPITAL_COMMUNITY): Payer: Medicare Other

## 2015-11-21 LAB — CBC
HEMATOCRIT: 32 % — AB (ref 39.0–52.0)
HEMOGLOBIN: 10.8 g/dL — AB (ref 13.0–17.0)
MCH: 33.8 pg (ref 26.0–34.0)
MCHC: 33.8 g/dL (ref 30.0–36.0)
MCV: 100 fL (ref 78.0–100.0)
Platelets: 249 10*3/uL (ref 150–400)
RBC: 3.2 MIL/uL — AB (ref 4.22–5.81)
RDW: 14.3 % (ref 11.5–15.5)
WBC: 10.6 10*3/uL — ABNORMAL HIGH (ref 4.0–10.5)

## 2015-11-21 MED ORDER — MAGNESIUM CITRATE PO SOLN
1.0000 | Freq: Once | ORAL | Status: AC
  Administered 2015-11-21: 1 via ORAL
  Filled 2015-11-21: qty 296

## 2015-11-21 MED ORDER — DOCUSATE SODIUM 100 MG PO CAPS
200.0000 mg | ORAL_CAPSULE | Freq: Two times a day (BID) | ORAL | Status: DC
  Administered 2015-11-21 – 2015-11-25 (×9): 200 mg via ORAL
  Filled 2015-11-21 (×9): qty 2

## 2015-11-21 NOTE — Care Management Important Message (Signed)
Important Message  Patient Details  Name: Rodney [name] MRN: [id number] Date of Birth: 1946-03-03   Medicare Important Message Given:  Yes    Nathen May 11/21/2015, 11:52 AM
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
## 2015-11-21 NOTE — Progress Notes (Signed)
Report called pt to transfer to 6N04 via w/c with belongings, wife called notified her of new room number.

## 2015-11-22 ENCOUNTER — Inpatient Hospital Stay (HOSPITAL_COMMUNITY): Payer: Medicare Other

## 2015-11-22 MED ORDER — TRAMADOL HCL 50 MG PO TABS
100.0000 mg | ORAL_TABLET | Freq: Two times a day (BID) | ORAL | Status: DC | PRN
Start: 2015-11-22 — End: 2015-11-25
  Administered 2015-11-23: 100 mg via ORAL
  Filled 2015-11-22: qty 2

## 2015-11-22 MED ORDER — HYDROMORPHONE HCL 1 MG/ML IJ SOLN: 0.5000 mg | INTRAMUSCULAR | Status: DC | PRN

## 2015-11-22 MED ORDER — BISACODYL 10 MG RE SUPP
10.0000 mg | Freq: Once | RECTAL | Status: AC
  Administered 2015-11-22: 10 mg via RECTAL
  Filled 2015-11-22: qty 1

## 2015-11-22 MED ORDER — OXYCODONE HCL 5 MG PO TABS
5.0000 mg | ORAL_TABLET | ORAL | Status: DC | PRN
  Administered 2015-11-22 – 2015-11-25 (×11): 10 mg via ORAL
  Filled 2015-11-22 (×11): qty 2

## 2015-11-22 MED ORDER — OXYCODONE HCL 5 MG PO TABS: 10.0000 mg | ORAL_TABLET | ORAL | Status: DC | PRN

## 2015-11-22 MED ORDER — POLYETHYLENE GLYCOL 3350 17 G PO PACK: 17.0000 g | PACK | Freq: Every day | ORAL | Status: DC

## 2015-11-22 NOTE — Progress Notes (Signed)
Pt and wife said that Pt been having dreams, ?hallucinations. Started in  the Unit he came from. Has another episode tonight, said that he is wide awake and have seen the TV and the clock fell off the wall and the bed tipped over. Wife is present during this time.

## 2015-11-22 NOTE — Progress Notes (Signed)
Clarified order for chest tube removal, inform MD that staff on this floor is not trained for such . Md stated they will check it in am.
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
## 2015-11-23 ENCOUNTER — Inpatient Hospital Stay (HOSPITAL_COMMUNITY): Payer: Medicare Other

## 2015-11-23 LAB — BASIC METABOLIC PANEL
Anion gap: 7 (ref 5–15)
BUN: 15 mg/dL (ref 6–20)
CHLORIDE: 102 mmol/L (ref 101–111)
CO2: 27 mmol/L (ref 22–32)
Calcium: 8.8 mg/dL — ABNORMAL LOW (ref 8.9–10.3)
Creatinine, Ser: 0.93 mg/dL (ref 0.61–1.24)
GFR calc Af Amer: >60 mL/min (ref >60)
GFR calc non Af Amer: >60 mL/min (ref >60)
Glucose, Bld: 108 mg/dL — ABNORMAL HIGH (ref 65–99)
POTASSIUM: 4.6 mmol/L (ref 3.5–5.1)
SODIUM: 136 mmol/L (ref 135–145)

## 2015-11-23 LAB — URINE CULTURE: Culture: >=100000

## 2015-11-23 NOTE — Progress Notes (Signed)
Patient BP 186/72. Dr. Gershon Crane notified.  Pain medicine given . Will continue to monitor patient
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
## 2015-11-24 ENCOUNTER — Inpatient Hospital Stay (HOSPITAL_COMMUNITY): Payer: Medicare Other

## 2015-11-24 NOTE — Progress Notes (Signed)
Patient ID: Hervie Monjaraz, male   DOB: [birth year], 69 y.o.   MRN: [id number]   LOS: 9 days   Subjective: Having vivid dreams and some myoclonic jerking that will wake him up.   Objective: Vital signs in last 24 hours: Temp:  [97.5 F (36.4 C)-97.9 F (36.6 C)] 97.8 F (36.6 C) (02/27 0620) Pulse Rate:  [58-73] 58 (02/27 0620) Resp:  [17-19] 17 (02/27 0620) BP: (156-186)/(60-73) 156/60 mmHg (02/27 0620) SpO2:  [94 %-97 %] 94 % (02/27 0620) Last BM Date: 11/22/15   Radiology Results CXR: [facility] (official read pending)   Physical Exam General appearance: alert and no distress Resp: clear to auscultation bilaterally and SQE left Cardio: regular rate and rhythm GI: normal findings: bowel sounds normal and soft, non-tender Extremities: NVI   Assessment/Plan: [facility] L scapula and clavicle FX - Non-operative per Dr. Ninfa Linden, in sling Left ulnar neuropathy -- On lyrica, unchanged so far so will d/c as it could be causing the myoclonus Mult L rib FX with PTX - CXR unchanged T2 TVP FX pain control UTI -- Proteus, sensitive to bactrim on Septra D6, will d/c FEN - continue daily miralax VTE -- SCD's, Lovenox Dispo - Home today after PT/OT    Lisette Abu, PA-C Pager: [phone] General Trauma PA Pager: [phone]  11/24/2015

## 2015-11-24 NOTE — Care Management Important Message (Signed)
Important Message  Patient Details  Name: Rodney [name] MRN: [id number] Date of Birth: 10-12-1945   Medicare Important Message Given:  Yes    Barb Merino Akhila Mahnken 11/24/2015, 4:57 PM
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
## 2015-11-25 MED ORDER — NAPROXEN 500 MG PO TABS: 500.0000 mg | ORAL_TABLET | Freq: Two times a day (BID) | ORAL | Status: DC

## 2015-11-25 MED ORDER — OXYCODONE-ACETAMINOPHEN 5-325 MG PO TABS: 1.0000 | ORAL_TABLET | ORAL | Status: DC | PRN

## 2015-11-25 NOTE — Care Management Note (Signed)
Case Management Note  Patient Details  Name: Rodney [name] MRN: [id number] Date of Birth: [birth year]  Subjective/Objective:    Pt medically stable for dc home today with spouse.  He will need DME and HH follow up.                  Action/Plan: Met with pt and wife to discuss dc arrangements.  Referral to [facility] for [facility] needs, per pt /wife choice.  Start of care 24-48h post dc date.  3 in 1 and cane delivered to pt's room prior to dc.    Expected Discharge Date:    11/25/15              Expected Discharge Plan:  [name]  In-House Referral:  Clinical Social Work  Discharge planning Services  CM Consult  Post Acute Care Choice:  Home Health Choice offered to:  Patient, Spouse  DME Arranged:  3-N-1, [name] DME Agency:  [place]:  PT [place]:  [place]  Status of Service:  Completed, signed off  Medicare Important Message Given:  Yes Date Medicare IM Given:    Medicare IM give by:    Date Additional Medicare IM Given:    Additional Medicare Important Message give by:     If discussed at Ages of Stay Meetings, dates discussed:    Additional Comments:  Reinaldo Raddle, RN, BSN  Trauma/Neuro ICU Case Manager [phone]
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
## 2015-11-25 NOTE — Discharge Instructions (Signed)
No driving while taking oxycodone.  Keep arm in sling while upright. No lifting, pushing, or pulling.  See your primary care provider to be cleared for driving/riding again.
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
## 2015-11-25 NOTE — Progress Notes (Signed)
Patient ID: Rodney [name], male   DOB: [birth year], 70 y.o.   MRN: [id number]   LOS: 10 days   Subjective: Doing well, ready to go home.   Objective: Vital signs in last 24 hours: Temp:  [97.7 F (36.5 C)-97.9 F (36.6 C)] 97.9 F (36.6 C) (02/28 0440) Pulse Rate:  [58-68] 58 (02/28 0440) Resp:  [17-18] 17 (02/28 0440) BP: (154-164)/(57-68) 154/57 mmHg (02/28 0440) SpO2:  [94 %-98 %] 96 % (02/28 0440) Last BM Date: 11/24/15   IS: 1267ml (+272ml)   Physical Exam General appearance: alert and no distress Resp: clear to auscultation bilaterally Cardio: regular rate and rhythm GI: normal findings: bowel sounds normal and soft, non-tender   Assessment/Plan: [facility] L scapula and clavicle FX - Non-operative per Dr. Ninfa Linden, in sling Left ulnar neuropathy  Mult L rib FX with PTX - CXR unchanged T2 TVP FX  Dispo - Home today after PT/OT    Lisette Abu, PA-C Pager: [phone] General Trauma PA Pager: [phone]  11/25/2015

## 2016-04-13 ENCOUNTER — Other Ambulatory Visit (HOSPITAL_COMMUNITY): Payer: Self-pay | Admitting: Internal Medicine

## 2016-04-13 DIAGNOSIS — R0602 Shortness of breath: Secondary | ICD-10-CM

## 2016-04-15 ENCOUNTER — Telehealth (HOSPITAL_COMMUNITY): Payer: Self-pay | Admitting: *Deleted

## 2016-04-15 NOTE — Telephone Encounter (Signed)
Patient given detailed instructions per Myocardial Perfusion Study Information Sheet for the test on 04/20/16. Patient notified to arrive 15 minutes early and that it is imperative to arrive on time for appointment to keep from having the test rescheduled.  If you need to cancel or reschedule your appointment, please call the office within 24 hours of your appointment. Failure to do so may result in a cancellation of your appointment, and a $50 no show fee. Patient verbalized understanding. Mumtaz Lovins J Miquan Tandon, RN   

## 2016-04-20 ENCOUNTER — Ambulatory Visit (HOSPITAL_COMMUNITY): Payer: Medicare Other | Attending: Cardiology

## 2016-04-20 DIAGNOSIS — I10 Essential (primary) hypertension: Secondary | ICD-10-CM | POA: Insufficient documentation

## 2016-04-20 DIAGNOSIS — R0609 Other forms of dyspnea: Secondary | ICD-10-CM | POA: Insufficient documentation

## 2016-04-20 DIAGNOSIS — Z8249 Family history of ischemic heart disease and other diseases of the circulatory system: Secondary | ICD-10-CM | POA: Diagnosis not present

## 2016-04-20 DIAGNOSIS — R0602 Shortness of breath: Secondary | ICD-10-CM | POA: Insufficient documentation

## 2016-04-20 LAB — MYOCARDIAL PERFUSION IMAGING
CHL CUP NUCLEAR SRS: 8
CHL CUP NUCLEAR SSS: 9
CSEPPHR: 66 {beats}/min
LV sys vol: 72 mL
LVDIAVOL: 154 mL (ref 62–150)
NUC STRESS TID: 0.9
RATE: 0.27
Rest HR: 54 {beats}/min
SDS: 1

## 2016-04-20 MED ORDER — REGADENOSON 0.4 MG/5ML IV SOLN
0.4000 mg | Freq: Once | INTRAVENOUS | Status: AC
  Administered 2016-04-20: 0.4 mg via INTRAVENOUS

## 2016-04-20 MED ORDER — TECHNETIUM TC 99M TETROFOSMIN IV KIT
31.7000 | PACK | Freq: Once | INTRAVENOUS | Status: AC | PRN
  Administered 2016-04-20: 31.7 via INTRAVENOUS
  Filled 2016-04-20: qty 32

## 2016-04-20 MED ORDER — TECHNETIUM TC 99M TETROFOSMIN IV KIT
10.5000 | PACK | Freq: Once | INTRAVENOUS | Status: AC | PRN
Start: 2016-04-20 — End: 2016-04-20
  Administered 2016-04-20: 11 via INTRAVENOUS
  Filled 2016-04-20: qty 11

## 2016-08-17 ENCOUNTER — Other Ambulatory Visit: Payer: Self-pay | Admitting: Internal Medicine

## 2016-08-17 ENCOUNTER — Ambulatory Visit
Admission: RE | Admit: 2016-08-17 | Discharge: 2016-08-17 | Disposition: A | Discharge status: Home / Self Care | Payer: Medicare Other | Source: Ambulatory Visit | Attending: Internal Medicine | Admitting: Internal Medicine

## 2016-08-17 DIAGNOSIS — R1031 Right lower quadrant pain: Secondary | ICD-10-CM

## 2016-09-07 ENCOUNTER — Ambulatory Visit (INDEPENDENT_AMBULATORY_CARE_PROVIDER_SITE_OTHER): Payer: Medicare Other

## 2016-09-07 ENCOUNTER — Ambulatory Visit (INDEPENDENT_AMBULATORY_CARE_PROVIDER_SITE_OTHER): Payer: Medicare Other | Admitting: Orthopaedic Surgery

## 2016-09-07 DIAGNOSIS — M25551 Pain in right hip: Secondary | ICD-10-CM | POA: Diagnosis not present

## 2016-09-07 DIAGNOSIS — M1611 Unilateral primary osteoarthritis, right hip: Secondary | ICD-10-CM | POA: Diagnosis not present

## 2016-09-07 DIAGNOSIS — S42022D Displaced fracture of shaft of left clavicle, subsequent encounter for fracture with routine healing: Secondary | ICD-10-CM
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
## 2016-09-23 ENCOUNTER — Ambulatory Visit (INDEPENDENT_AMBULATORY_CARE_PROVIDER_SITE_OTHER): Payer: Medicare Other | Admitting: Physician Assistant

## 2016-09-23 ENCOUNTER — Ambulatory Visit (INDEPENDENT_AMBULATORY_CARE_PROVIDER_SITE_OTHER): Payer: Medicare Other

## 2016-09-23 DIAGNOSIS — R0781 Pleurodynia: Secondary | ICD-10-CM

## 2016-09-23 DIAGNOSIS — S2232XA Fracture of one rib, left side, initial encounter for closed fracture: Secondary | ICD-10-CM | POA: Diagnosis not present

## 2016-09-23 MED ORDER — TRAMADOL HCL 50 MG PO TABS: 50.0000 mg | ORAL_TABLET | Freq: Four times a day (QID) | ORAL | 1 refills | Status: DC | PRN
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
## 2016-09-23 NOTE — Progress Notes (Unsigned)
                                                                                                                                                                                                                                                                                                                                                                                                                                                                                                                                                                                                                                                                                                                                                                                                                                                                                                                                                                                                                                                                                                                                                                                                                                                                                                                                                                                                                                                                                                                                                                                                                                                                                                                                                                                                                                                                                                                                                                                                                                                                                                                                                                                                                                                                                                                                                                                                                                                                                                                                                                                                                                                                                                                                                                                                                                                                                                                                                                                                                                                                                                                                                                                                                                                                                                                                                                                                                                                                                                                                                                                                                                                                                                                                                                                                                                                                                                                                                                                                                                                                                                                                                                                                                                                                                                                                                                                                                                                                                                                                                                                                                                                                                                                                                                                                                                                                                                                                                                                                                                                                                                                                                                                                                                                                                                                                                                                                                                                                                                                                                                                                                                                                                                                                                                                                                                                                                                                                                                                                                                                                                                                                                                                                                                                                                                                                                                                                                                                                                                                                                                                                                                                                                                                                                                                                                                                                                                                                                                                                                                                                                                                                                                                                                                                                                                                                                                                                                                                                                                                                                                                                                                                                                                                                                                                                                                                                                                                                                                                                                                                                                                                                                                                                                                                                                                                                                                                                                                                                                                                                                                                                                                                                                                                                                                                                                                                                                                                                                                                                                                                                                                                                                                                                                                                                                                                                                                                                                                                                                                                                                                                                                                                                                                                                                                                                                                                                                                                                                                                                                                                                                                                                                                                                                                                                                                                                                                                                                                                                                                                                                                                                                                                                                                                                                                                                                                                                                                                                                                                                                                                                                                                                                                                                                                                                                                                                                                                                                                                                                                                                                                                                                                                                                                                                                                                                                                                                                                                                                                                                                                                                                                                                                                                                                                                                                                                                                                                                                                                                                                                                                                                                                                                                                                                                                                                                                                                                                                                                                                                                                                                                                                                                                                                                                                                                                                                                                                                                                                                                                                                                                                                                                                                                                                                                                                                                                                                                                                                                                                                                                                                                                                                                                                                                                                                                                                                                                                                                                                                                                                                                                                                                                                                                                                                                                                                                                                                                                                                                                                                                                                                                                                                                                                                                                                                                                                                                                                                                                                                                                                                                                                                                                                                                                                                                                                                                                                                                                                                                                                                                                                                                                                                                                                                                                                                                                                                                                                                                                                                                                                                                              ees

## 2016-10-07 ENCOUNTER — Ambulatory Visit (INDEPENDENT_AMBULATORY_CARE_PROVIDER_SITE_OTHER): Payer: Medicare Other | Admitting: Orthopaedic Surgery

## 2016-10-07 DIAGNOSIS — M25551 Pain in right hip: Secondary | ICD-10-CM

## 2016-10-07 DIAGNOSIS — S2242XD Multiple fractures of ribs, left side, subsequent encounter for fracture with routine healing: Secondary | ICD-10-CM

## 2016-10-07 MED ORDER — TIZANIDINE HCL 4 MG PO TABS: 4.0000 mg | ORAL_TABLET | Freq: Three times a day (TID) | ORAL | 0 refills | Status: DC | PRN

## 2016-10-07 NOTE — Progress Notes (Signed)
[name] is following up for his rib fractures as well as his hip arthritis on the right hip. He is someone who still recovering from retina surgery and has not been cleared for his hip replacement. He sees his I Psychologist, sport and exercise in mid February. He says he's having a hard time getting over his rib fractures. He feels like his fall was likely related to his vision issues. He hopes to be cleared for surgery in February so he can hopefully consider hip replacement potentially in March.  On examination of his right hip he still has pain with range of motion of the right hip. He also has left-sided rib pain. He is breathing well without a shortness of breath.  At this point we'll see him back in about 6 weeks to see how is doing overall. We would like chest x-ray to see what his left rib fractures look like. Hopefully by then he'll be cleared by his I surgeon for considering hip replacement surgery.

## 2016-10-31 ENCOUNTER — Other Ambulatory Visit (INDEPENDENT_AMBULATORY_CARE_PROVIDER_SITE_OTHER): Payer: Self-pay | Admitting: Physician Assistant

## 2016-11-01 NOTE — Telephone Encounter (Signed)
Please advise 

## 2016-11-01 NOTE — Telephone Encounter (Signed)
Ok to refill 

## 2016-11-03 NOTE — Telephone Encounter (Signed)
I did yesterday

## 2016-11-03 NOTE — Telephone Encounter (Signed)
Did you already do this? It says Approved by Caryl Pina?

## 2016-11-08 ENCOUNTER — Telehealth (INDEPENDENT_AMBULATORY_CARE_PROVIDER_SITE_OTHER): Payer: Self-pay | Admitting: Orthopaedic Surgery

## 2016-11-08 NOTE — Telephone Encounter (Signed)
Patient is requesting a cortizone shot asap, I told him gil has an appt on Wednesday but he told me to tell you he has had a rough weekend and he needs to come in asap.  Cb#: [phone]

## 2016-11-08 NOTE — Telephone Encounter (Signed)
We can work him on [place] today, not [place] today though

## 2016-11-10 ENCOUNTER — Ambulatory Visit (INDEPENDENT_AMBULATORY_CARE_PROVIDER_SITE_OTHER): Payer: Medicare Other

## 2016-11-10 ENCOUNTER — Ambulatory Visit (INDEPENDENT_AMBULATORY_CARE_PROVIDER_SITE_OTHER): Payer: Medicare Other | Admitting: Physician Assistant

## 2016-11-10 ENCOUNTER — Ambulatory Visit (INDEPENDENT_AMBULATORY_CARE_PROVIDER_SITE_OTHER): Payer: Self-pay

## 2016-11-10 DIAGNOSIS — M25551 Pain in right hip: Secondary | ICD-10-CM

## 2016-11-10 DIAGNOSIS — S2242XS Multiple fractures of ribs, left side, sequela: Secondary | ICD-10-CM | POA: Diagnosis not present

## 2016-11-10 MED ORDER — HYDROCODONE-ACETAMINOPHEN 5-325 MG PO TABS: 1.0000 | ORAL_TABLET | Freq: Four times a day (QID) | ORAL | 0 refills | Status: AC | PRN
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
## 2016-11-11 MED ORDER — BUPIVACAINE HCL 0.5 % IJ SOLN
3.0000 mL | INTRAMUSCULAR | Status: AC | PRN
  Administered 2016-11-10: 3 mL via INTRA_ARTICULAR

## 2016-11-11 MED ORDER — TRIAMCINOLONE ACETONIDE 40 MG/ML IJ SUSP
80.0000 mg | INTRAMUSCULAR | Status: AC | PRN
  Administered 2016-11-10: 80 mg via INTRA_ARTICULAR
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
## 2016-11-24 ENCOUNTER — Ambulatory Visit (INDEPENDENT_AMBULATORY_CARE_PROVIDER_SITE_OTHER): Payer: Medicare Other | Admitting: Orthopaedic Surgery

## 2016-11-26 ENCOUNTER — Other Ambulatory Visit (INDEPENDENT_AMBULATORY_CARE_PROVIDER_SITE_OTHER): Payer: Self-pay | Admitting: Physician Assistant

## 2016-12-01 ENCOUNTER — Encounter (HOSPITAL_COMMUNITY): Payer: Self-pay

## 2016-12-01 ENCOUNTER — Other Ambulatory Visit: Payer: Self-pay

## 2016-12-01 ENCOUNTER — Encounter (HOSPITAL_COMMUNITY)
Admission: RE | Admit: 2016-12-01 | Discharge: 2016-12-01 | Disposition: A | Discharge status: Home / Self Care | Payer: Medicare Other | Source: Ambulatory Visit | Attending: Orthopaedic Surgery | Admitting: Orthopaedic Surgery

## 2016-12-01 DIAGNOSIS — Z79899 Other long term (current) drug therapy: Secondary | ICD-10-CM | POA: Diagnosis not present

## 2016-12-01 DIAGNOSIS — Z01812 Encounter for preprocedural laboratory examination: Secondary | ICD-10-CM | POA: Diagnosis not present

## 2016-12-01 DIAGNOSIS — K449 Diaphragmatic hernia without obstruction or gangrene: Secondary | ICD-10-CM | POA: Insufficient documentation

## 2016-12-01 DIAGNOSIS — M5412 Radiculopathy, cervical region: Secondary | ICD-10-CM | POA: Insufficient documentation

## 2016-12-01 DIAGNOSIS — M1611 Unilateral primary osteoarthritis, right hip: Secondary | ICD-10-CM | POA: Insufficient documentation

## 2016-12-01 DIAGNOSIS — G4733 Obstructive sleep apnea (adult) (pediatric): Secondary | ICD-10-CM | POA: Diagnosis not present

## 2016-12-01 DIAGNOSIS — Z79891 Long term (current) use of opiate analgesic: Secondary | ICD-10-CM | POA: Diagnosis not present

## 2016-12-01 DIAGNOSIS — I10 Essential (primary) hypertension: Secondary | ICD-10-CM | POA: Insufficient documentation

## 2016-12-01 DIAGNOSIS — Z7982 Long term (current) use of aspirin: Secondary | ICD-10-CM | POA: Diagnosis not present

## 2016-12-01 HISTORY — DX: Unspecified osteoarthritis, unspecified site: M19.90

## 2016-12-01 HISTORY — DX: Personal history of other diseases of the digestive system: Z87.19

## 2016-12-01 HISTORY — DX: Dyspnea, unspecified: R06.00

## 2016-12-01 LAB — COMPREHENSIVE METABOLIC PANEL
ALBUMIN: 3.8 g/dL (ref 3.5–5.0)
ALT: 38 U/L (ref 17–63)
ANION GAP: 9 (ref 5–15)
AST: 36 U/L (ref 15–41)
Alkaline Phosphatase: 56 U/L (ref 38–126)
BUN: 19 mg/dL (ref 6–20)
CHLORIDE: 99 mmol/L — AB (ref 101–111)
CO2: 26 mmol/L (ref 22–32)
Calcium: 9.3 mg/dL (ref 8.9–10.3)
Creatinine, Ser: 1 mg/dL (ref 0.61–1.24)
GFR calc Af Amer: >60 mL/min (ref >60)
GFR calc non Af Amer: >60 mL/min (ref >60)
GLUCOSE: 102 mg/dL — AB (ref 65–99)
POTASSIUM: 4.1 mmol/L (ref 3.5–5.1)
SODIUM: 134 mmol/L — AB (ref 135–145)
Total Bilirubin: 0.7 mg/dL (ref 0.3–1.2)
Total Protein: 6.9 g/dL (ref 6.5–8.1)

## 2016-12-01 LAB — CBC
HEMATOCRIT: 45.2 % (ref 39.0–52.0)
HEMOGLOBIN: 15.4 g/dL (ref 13.0–17.0)
MCH: 33.5 pg (ref 26.0–34.0)
MCHC: 34.1 g/dL (ref 30.0–36.0)
MCV: 98.3 fL (ref 78.0–100.0)
Platelets: 193 10*3/uL (ref 150–400)
RBC: 4.6 MIL/uL (ref 4.22–5.81)
RDW: 14.2 % (ref 11.5–15.5)
WBC: 7.2 10*3/uL (ref 4.0–10.5)

## 2016-12-01 LAB — SURGICAL PCR SCREEN
MRSA, PCR: NEGATIVE
Staphylococcus aureus: NEGATIVE

## 2016-12-01 NOTE — Pre-Procedure Instructions (Signed)
Rogelio Waynick  12/01/2016      [id number] - [place], [place] [address] [place] [address] Phone: [phone] Fax: [phone]    Your procedure is scheduled on March 13  Report to [facility] at 1220 P.M.  Call this number if you have problems the morning of surgery:  [phone]   Remember:  Do not eat food or drink liquids after midnight.   Take these medicines the morning of surgery with A SIP OF WATER ALPRAZolam (XANAX),  atenolol (TENORMIN), hydrALAZINE (APRESOLINE), HYDROcodone-acetaminophen (NORCO/VICODIN, pantoprazole (PROTONIX), primidone (MYSOLINE), eye drops if needed  7 days prior to surgery STOP taking any Aspirin, Aleve, Naproxen, Ibuprofen, Motrin, Advil, Goody's, BC's, all herbal medications, fish oil, and all vitamins     Do not wear jewelry.  Do not wear lotions, powders, or cologne, or deoderant.  Men may shave face and neck.  Do not bring valuables to the hospital.  [facility] is not responsible for any belongings or valuables.  Contacts, dentures or bridgework may not be worn into surgery.  Leave your suitcase in the car.  After surgery it may be brought to your room.  For patients admitted to the hospital, discharge time will be determined by your treatment team.  Patients discharged the day of surgery will not be allowed to drive home.    Special instructions:   [facility]- Preparing For Surgery  Before surgery, you can play an important role. Because skin is not sterile, your skin needs to be as free of germs as possible. You can reduce the number of germs on your skin by washing with CHG (chlorahexidine gluconate) Soap before surgery.  CHG is an antiseptic cleaner which kills germs and bonds with the skin to continue killing germs even after washing.  Please do not use if you have an allergy to CHG or antibacterial soaps. If your skin  becomes reddened/irritated stop using the CHG.  Do not shave (including legs and underarms) for at least 48 hours prior to first CHG shower. It is OK to shave your face.  Please follow these instructions carefully.   1. Shower the NIGHT BEFORE SURGERY and the MORNING OF SURGERY with CHG.   2. If you chose to wash your hair, wash your hair first as usual with your normal shampoo.  3. After you shampoo, rinse your hair and body thoroughly to remove the shampoo.  4. Use CHG as you would any other liquid soap. You can apply CHG directly to the skin and wash gently with a scrungie or a clean washcloth.   5. Apply the CHG Soap to your body ONLY FROM THE NECK DOWN.  Do not use on open wounds or open sores. Avoid contact with your eyes, ears, mouth and genitals (private parts). Wash genitals (private parts) with your normal soap.  6. Wash thoroughly, paying special attention to the area where your surgery will be performed.  7. Thoroughly rinse your body with warm water from the neck down.  8. DO NOT shower/wash with your normal soap after using and rinsing off the CHG Soap.  9. Pat yourself dry with a CLEAN TOWEL.   10. Wear CLEAN PAJAMAS   11. Place CLEAN SHEETS on your bed the night of your first shower and DO NOT SLEEP WITH PETS.    Day of Surgery: Do not apply any deodorants/lotions. Please wear clean clothes to the hospital/surgery center.  Please read over the following fact sheets that you were given.

## 2016-12-01 NOTE — Progress Notes (Signed)
PCP - Josetta Huddle Cardiologist - Scarlette Calico - saw once for a stress test  Chest x-ray - not needed EKG - 12/03/15 Stress Test - 04/20/16  ECHO - denies Cardiac Cath - denies  Sleep Study - 15 years ago CPAP - no CPAP    Patient denies shortness of breath, fever, cough and chest pain at PAT appointment   Patient verbalized understanding of instructions that was given to them at the PAT appointment. Patient expressed that there were no further questions.  Patient was also instructed that they will need to review over the PAT instructions again at home before the surgery.
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
## 2016-12-06 MED ORDER — SODIUM CHLORIDE 0.9 % IV SOLN
1000.0000 mg | INTRAVENOUS | Status: AC
  Administered 2016-12-07: 1000 mg via INTRAVENOUS
  Filled 2016-12-06: qty 10

## 2016-12-06 MED ORDER — CEFAZOLIN SODIUM-DEXTROSE 2-4 GM/100ML-% IV SOLN
2.0000 g | INTRAVENOUS | Status: AC
  Administered 2016-12-07: 2 g via INTRAVENOUS
  Filled 2016-12-06: qty 100

## 2016-12-07 ENCOUNTER — Encounter (HOSPITAL_COMMUNITY): Payer: Self-pay | Admitting: *Deleted

## 2016-12-07 ENCOUNTER — Inpatient Hospital Stay (HOSPITAL_COMMUNITY): Payer: Medicare Other | Admitting: Certified Registered"

## 2016-12-07 ENCOUNTER — Inpatient Hospital Stay (HOSPITAL_COMMUNITY): Payer: Medicare Other

## 2016-12-07 ENCOUNTER — Inpatient Hospital Stay (HOSPITAL_COMMUNITY): Payer: Medicare Other | Admitting: Vascular Surgery

## 2016-12-07 ENCOUNTER — Inpatient Hospital Stay (HOSPITAL_COMMUNITY)
Admission: RE | Admit: 2016-12-07 | Discharge: 2016-12-10 | DRG: 470 | Disposition: A | Discharge status: Home Health | Payer: Medicare Other | Source: Ambulatory Visit | Attending: Orthopaedic Surgery | Admitting: Orthopaedic Surgery

## 2016-12-07 ENCOUNTER — Encounter (HOSPITAL_COMMUNITY)
Admission: RE | Disposition: A | Discharge status: Home Health | Payer: Self-pay | Source: Ambulatory Visit | Attending: Orthopaedic Surgery

## 2016-12-07 DIAGNOSIS — E785 Hyperlipidemia, unspecified: Secondary | ICD-10-CM | POA: Diagnosis not present

## 2016-12-07 DIAGNOSIS — Z79891 Long term (current) use of opiate analgesic: Secondary | ICD-10-CM | POA: Diagnosis not present

## 2016-12-07 DIAGNOSIS — Z7982 Long term (current) use of aspirin: Secondary | ICD-10-CM

## 2016-12-07 DIAGNOSIS — Z79899 Other long term (current) drug therapy: Secondary | ICD-10-CM | POA: Diagnosis not present

## 2016-12-07 DIAGNOSIS — Z888 Allergy status to other drugs, medicaments and biological substances status: Secondary | ICD-10-CM

## 2016-12-07 DIAGNOSIS — F17210 Nicotine dependence, cigarettes, uncomplicated: Secondary | ICD-10-CM | POA: Diagnosis not present

## 2016-12-07 DIAGNOSIS — I10 Essential (primary) hypertension: Secondary | ICD-10-CM | POA: Diagnosis not present

## 2016-12-07 DIAGNOSIS — M1611 Unilateral primary osteoarthritis, right hip: Secondary | ICD-10-CM | POA: Diagnosis present

## 2016-12-07 DIAGNOSIS — Z419 Encounter for procedure for purposes other than remedying health state, unspecified: Secondary | ICD-10-CM

## 2016-12-07 DIAGNOSIS — Z96641 Presence of right artificial hip joint: Secondary | ICD-10-CM

## 2016-12-07 HISTORY — PX: TOTAL HIP ARTHROPLASTY: SHX124

## 2016-12-07 SURGERY — ARTHROPLASTY, HIP, TOTAL, ANTERIOR APPROACH
Anesthesia: Monitor Anesthesia Care | Site: Hip | Laterality: Right

## 2016-12-07 MED ORDER — METOCLOPRAMIDE HCL 5 MG/ML IJ SOLN: 5.0000 mg | Freq: Three times a day (TID) | INTRAMUSCULAR | Status: DC | PRN

## 2016-12-07 MED ORDER — CLONIDINE HCL 0.1 MG PO TABS
0.1000 mg | ORAL_TABLET | Freq: Every day | ORAL | Status: DC
  Administered 2016-12-07 – 2016-12-09 (×3): 0.1 mg via ORAL
  Filled 2016-12-07 (×3): qty 1

## 2016-12-07 MED ORDER — ONDANSETRON HCL 4 MG/2ML IJ SOLN: 4.0000 mg | Freq: Four times a day (QID) | INTRAMUSCULAR | Status: DC | PRN

## 2016-12-07 MED ORDER — CEFAZOLIN IN D5W 1 GM/50ML IV SOLN
1.0000 g | Freq: Four times a day (QID) | INTRAVENOUS | Status: AC
  Administered 2016-12-07 – 2016-12-08 (×2): 1 g via INTRAVENOUS
  Filled 2016-12-07 (×2): qty 50

## 2016-12-07 MED ORDER — PHENOL 1.4 % MT LIQD: 1.0000 | OROMUCOSAL | Status: DC | PRN

## 2016-12-07 MED ORDER — VITAMIN D 1000 UNITS PO TABS
1000.0000 [IU] | ORAL_TABLET | Freq: Every day | ORAL | Status: DC
  Administered 2016-12-07 – 2016-12-10 (×4): 1000 [IU] via ORAL
  Filled 2016-12-07 (×4): qty 1

## 2016-12-07 MED ORDER — ATENOLOL 50 MG PO TABS
25.0000 mg | ORAL_TABLET | Freq: Two times a day (BID) | ORAL | Status: DC
  Administered 2016-12-07 – 2016-12-10 (×6): 25 mg via ORAL
  Filled 2016-12-07 (×6): qty 1

## 2016-12-07 MED ORDER — SODIUM CHLORIDE 0.9 % IR SOLN
Status: DC | PRN
  Administered 2016-12-07: 3000 mL

## 2016-12-07 MED ORDER — PRIMIDONE 50 MG PO TABS
100.0000 mg | ORAL_TABLET | Freq: Every day | ORAL | Status: DC
  Administered 2016-12-07 – 2016-12-09 (×3): 100 mg via ORAL
  Filled 2016-12-07 (×3): qty 2

## 2016-12-07 MED ORDER — OXYCODONE HCL 5 MG PO TABS: 5.0000 mg | ORAL_TABLET | Freq: Once | ORAL | Status: DC | PRN

## 2016-12-07 MED ORDER — COQ10 100 MG PO CAPS: 100.0000 mg | ORAL_CAPSULE | Freq: Every day | ORAL | Status: DC

## 2016-12-07 MED ORDER — ONDANSETRON HCL 4 MG/2ML IJ SOLN: 4.0000 mg | Freq: Once | INTRAMUSCULAR | Status: DC | PRN

## 2016-12-07 MED ORDER — VALSARTAN-HYDROCHLOROTHIAZIDE 320-12.5 MG PO TABS: 1.0000 | ORAL_TABLET | Freq: Every day | ORAL | Status: DC

## 2016-12-07 MED ORDER — METHOCARBAMOL 1000 MG/10ML IJ SOLN
500.0000 mg | Freq: Four times a day (QID) | INTRAVENOUS | Status: DC | PRN
  Filled 2016-12-07: qty 5

## 2016-12-07 MED ORDER — MIDAZOLAM HCL 5 MG/5ML IJ SOLN
INTRAMUSCULAR | Status: DC | PRN
  Administered 2016-12-07: 2 mg via INTRAVENOUS

## 2016-12-07 MED ORDER — DULOXETINE HCL 60 MG PO CPEP
60.0000 mg | ORAL_CAPSULE | Freq: Every day | ORAL | Status: DC
  Administered 2016-12-07 – 2016-12-09 (×3): 60 mg via ORAL
  Filled 2016-12-07 (×3): qty 1

## 2016-12-07 MED ORDER — 0.9 % SODIUM CHLORIDE (POUR BTL) OPTIME
TOPICAL | Status: DC | PRN
  Administered 2016-12-07: 1000 mL

## 2016-12-07 MED ORDER — METOCLOPRAMIDE HCL 5 MG PO TABS
5.0000 mg | ORAL_TABLET | Freq: Three times a day (TID) | ORAL | Status: DC | PRN
Start: 2016-12-07 — End: 2016-12-10

## 2016-12-07 MED ORDER — ADULT MULTIVITAMIN W/MINERALS CH
1.0000 | ORAL_TABLET | Freq: Every day | ORAL | Status: DC
  Administered 2016-12-07 – 2016-12-10 (×4): 1 via ORAL
  Filled 2016-12-07 (×4): qty 1

## 2016-12-07 MED ORDER — HYDROMORPHONE HCL 2 MG/ML IJ SOLN
1.0000 mg | INTRAMUSCULAR | Status: DC | PRN
  Administered 2016-12-08: 1 mg via INTRAVENOUS
  Filled 2016-12-07: qty 1

## 2016-12-07 MED ORDER — SODIUM CHLORIDE 0.9 % IV SOLN
INTRAVENOUS | Status: DC
  Administered 2016-12-08: 01:00:00 via INTRAVENOUS

## 2016-12-07 MED ORDER — TAMSULOSIN HCL 0.4 MG PO CAPS
0.4000 mg | ORAL_CAPSULE | Freq: Every day | ORAL | Status: DC
  Administered 2016-12-07 – 2016-12-09 (×3): 0.4 mg via ORAL
  Filled 2016-12-07 (×3): qty 1

## 2016-12-07 MED ORDER — OXYCODONE HCL 5 MG PO TABS
5.0000 mg | ORAL_TABLET | ORAL | Status: DC | PRN
  Administered 2016-12-07 – 2016-12-08 (×5): 10 mg via ORAL
  Administered 2016-12-08: 5 mg via ORAL
  Administered 2016-12-08 (×2): 10 mg via ORAL
  Administered 2016-12-09 (×3): 5 mg via ORAL
  Administered 2016-12-09: 10 mg via ORAL
  Administered 2016-12-09 (×2): 5 mg via ORAL
  Administered 2016-12-10: 10 mg via ORAL
  Administered 2016-12-10: 5 mg via ORAL
  Administered 2016-12-10: 10 mg via ORAL
  Filled 2016-12-07: qty 1
  Filled 2016-12-07 (×3): qty 2
  Filled 2016-12-07: qty 1
  Filled 2016-12-07 (×2): qty 2
  Filled 2016-12-07 (×3): qty 1
  Filled 2016-12-07: qty 2
  Filled 2016-12-07: qty 1
  Filled 2016-12-07 (×2): qty 2
  Filled 2016-12-07: qty 1
  Filled 2016-12-07 (×3): qty 2
  Filled 2016-12-07: qty 1

## 2016-12-07 MED ORDER — FENTANYL CITRATE (PF) 100 MCG/2ML IJ SOLN
INTRAMUSCULAR | Status: DC | PRN
  Administered 2016-12-07: 100 ug via INTRAVENOUS

## 2016-12-07 MED ORDER — DOCUSATE SODIUM 100 MG PO CAPS
100.0000 mg | ORAL_CAPSULE | Freq: Two times a day (BID) | ORAL | Status: DC
  Administered 2016-12-07 – 2016-12-10 (×6): 100 mg via ORAL
  Filled 2016-12-07 (×6): qty 1

## 2016-12-07 MED ORDER — B COMPLEX-C PO TABS
1.0000 | ORAL_TABLET | Freq: Every day | ORAL | Status: DC
  Administered 2016-12-07 – 2016-12-10 (×4): 1 via ORAL
  Filled 2016-12-07 (×4): qty 1

## 2016-12-07 MED ORDER — ALUM & MAG HYDROXIDE-SIMETH 200-200-20 MG/5ML PO SUSP: 30.0000 mL | ORAL | Status: DC | PRN

## 2016-12-07 MED ORDER — LACTATED RINGERS IV SOLN
INTRAVENOUS | Status: DC
  Administered 2016-12-07 (×2): via INTRAVENOUS

## 2016-12-07 MED ORDER — OXYCODONE HCL 5 MG/5ML PO SOLN: 5.0000 mg | Freq: Once | ORAL | Status: DC | PRN

## 2016-12-07 MED ORDER — ASPIRIN 81 MG PO CHEW
81.0000 mg | CHEWABLE_TABLET | Freq: Two times a day (BID) | ORAL | Status: DC
  Administered 2016-12-07 – 2016-12-10 (×6): 81 mg via ORAL
  Filled 2016-12-07 (×6): qty 1

## 2016-12-07 MED ORDER — EPHEDRINE 5 MG/ML INJ
INTRAVENOUS | Status: AC
  Filled 2016-12-07: qty 10

## 2016-12-07 MED ORDER — FENTANYL CITRATE (PF) 100 MCG/2ML IJ SOLN
INTRAMUSCULAR | Status: AC
  Filled 2016-12-07: qty 4

## 2016-12-07 MED ORDER — KETAMINE HCL-SODIUM CHLORIDE 100-0.9 MG/10ML-% IV SOSY
PREFILLED_SYRINGE | INTRAVENOUS | Status: AC
  Filled 2016-12-07: qty 10

## 2016-12-07 MED ORDER — PHENYLEPHRINE HCL 10 MG/ML IJ SOLN
INTRAVENOUS | Status: DC | PRN
  Administered 2016-12-07: 30 ug/min via INTRAVENOUS

## 2016-12-07 MED ORDER — ALPRAZOLAM 0.25 MG PO TABS
0.2500 mg | ORAL_TABLET | Freq: Two times a day (BID) | ORAL | Status: DC
  Administered 2016-12-07 – 2016-12-10 (×5): 0.25 mg via ORAL
  Filled 2016-12-07 (×6): qty 1

## 2016-12-07 MED ORDER — PROPOFOL 500 MG/50ML IV EMUL
INTRAVENOUS | Status: DC | PRN
  Administered 2016-12-07: 75 ug/kg/min via INTRAVENOUS

## 2016-12-07 MED ORDER — PROPOFOL 1000 MG/100ML IV EMUL
INTRAVENOUS | Status: AC
  Filled 2016-12-07: qty 100

## 2016-12-07 MED ORDER — MENTHOL 3 MG MT LOZG: 1.0000 | LOZENGE | OROMUCOSAL | Status: DC | PRN

## 2016-12-07 MED ORDER — SIMVASTATIN 10 MG PO TABS
5.0000 mg | ORAL_TABLET | ORAL | Status: DC
  Administered 2016-12-07 – 2016-12-09 (×2): 5 mg via ORAL
  Filled 2016-12-07 (×2): qty 1

## 2016-12-07 MED ORDER — FENTANYL CITRATE (PF) 100 MCG/2ML IJ SOLN: 25.0000 ug | INTRAMUSCULAR | Status: DC | PRN

## 2016-12-07 MED ORDER — EPHEDRINE SULFATE-NACL 50-0.9 MG/10ML-% IV SOSY
PREFILLED_SYRINGE | INTRAVENOUS | Status: DC | PRN
  Administered 2016-12-07 (×2): 10 mg via INTRAVENOUS

## 2016-12-07 MED ORDER — MIDAZOLAM HCL 2 MG/2ML IJ SOLN
INTRAMUSCULAR | Status: AC
  Filled 2016-12-07: qty 2

## 2016-12-07 MED ORDER — IRBESARTAN 300 MG PO TABS
300.0000 mg | ORAL_TABLET | Freq: Every day | ORAL | Status: DC
  Administered 2016-12-07 – 2016-12-09 (×3): 300 mg via ORAL
  Filled 2016-12-07 (×4): qty 1

## 2016-12-07 MED ORDER — CHLORHEXIDINE GLUCONATE 4 % EX LIQD: 60.0000 mL | Freq: Once | CUTANEOUS | Status: DC

## 2016-12-07 MED ORDER — HYDROCHLOROTHIAZIDE 12.5 MG PO CAPS
12.5000 mg | ORAL_CAPSULE | Freq: Every day | ORAL | Status: DC
  Administered 2016-12-07 – 2016-12-10 (×4): 12.5 mg via ORAL
  Filled 2016-12-07 (×4): qty 1

## 2016-12-07 MED ORDER — ARTIFICIAL TEARS OP OINT
1.0000 "application " | TOPICAL_OINTMENT | Freq: Three times a day (TID) | OPHTHALMIC | Status: DC | PRN
  Filled 2016-12-07: qty 3.5

## 2016-12-07 MED ORDER — METHOCARBAMOL 500 MG PO TABS
500.0000 mg | ORAL_TABLET | Freq: Four times a day (QID) | ORAL | Status: DC | PRN
Start: 2016-12-07 — End: 2016-12-10
  Administered 2016-12-08 – 2016-12-09 (×4): 500 mg via ORAL
  Filled 2016-12-07 (×5): qty 1

## 2016-12-07 MED ORDER — PRIMIDONE 50 MG PO TABS
50.0000 mg | ORAL_TABLET | Freq: Every day | ORAL | Status: DC
  Administered 2016-12-08 – 2016-12-10 (×2): 50 mg via ORAL
  Filled 2016-12-07 (×3): qty 1

## 2016-12-07 MED ORDER — ONDANSETRON HCL 4 MG PO TABS: 4.0000 mg | ORAL_TABLET | Freq: Four times a day (QID) | ORAL | Status: DC | PRN

## 2016-12-07 MED ORDER — AMLODIPINE BESYLATE 10 MG PO TABS
10.0000 mg | ORAL_TABLET | Freq: Every day | ORAL | Status: DC
  Administered 2016-12-08 – 2016-12-09 (×2): 10 mg via ORAL
  Filled 2016-12-07 (×2): qty 1

## 2016-12-07 MED ORDER — ACETAMINOPHEN 325 MG PO TABS: 650.0000 mg | ORAL_TABLET | Freq: Four times a day (QID) | ORAL | Status: DC | PRN

## 2016-12-07 MED ORDER — DIPHENHYDRAMINE HCL 12.5 MG/5ML PO ELIX: 12.5000 mg | ORAL_SOLUTION | ORAL | Status: DC | PRN

## 2016-12-07 MED ORDER — ACETAMINOPHEN 650 MG RE SUPP: 650.0000 mg | Freq: Four times a day (QID) | RECTAL | Status: DC | PRN

## 2016-12-07 MED ORDER — PANTOPRAZOLE SODIUM 40 MG PO TBEC
40.0000 mg | DELAYED_RELEASE_TABLET | Freq: Every day | ORAL | Status: DC
  Administered 2016-12-08 – 2016-12-10 (×3): 40 mg via ORAL
  Filled 2016-12-07 (×3): qty 1

## 2016-12-07 MED ORDER — HYDRALAZINE HCL 25 MG PO TABS
25.0000 mg | ORAL_TABLET | Freq: Two times a day (BID) | ORAL | Status: DC
  Administered 2016-12-07 – 2016-12-09 (×5): 25 mg via ORAL
  Filled 2016-12-07 (×6): qty 1

## 2016-12-07 SURGICAL SUPPLY — 51 items
APL SKNCLS STERI-STRIP NONHPOA (GAUZE/BANDAGES/DRESSINGS) ×1
BENZOIN TINCTURE PRP APPL 2/3 (GAUZE/BANDAGES/DRESSINGS) ×2 IMPLANT
BLADE CLIPPER SURG (BLADE) IMPLANT
BLADE SAW SGTL 18X1.27X75 (BLADE) ×2 IMPLANT
CAPT HIP TOTAL 2 ×1 IMPLANT
CELLS DAT CNTRL 66122 CELL SVR (MISCELLANEOUS) ×1 IMPLANT
COVER SURGICAL LIGHT HANDLE (MISCELLANEOUS) ×2 IMPLANT
DRAPE C-ARM 42X72 X-RAY (DRAPES) ×2 IMPLANT
DRAPE STERI IOBAN 125X83 (DRAPES) ×2 IMPLANT
DRAPE U-SHAPE 47X51 STRL (DRAPES) ×7 IMPLANT
DRSG AQUACEL AG ADV 3.5X10 (GAUZE/BANDAGES/DRESSINGS) ×2 IMPLANT
DURAPREP 26ML APPLICATOR (WOUND CARE) ×2 IMPLANT
ELECT BLADE 4.0 EZ CLEAN MEGAD (MISCELLANEOUS) ×2
ELECT BLADE 6.5 EXT (BLADE) IMPLANT
ELECT REM PT RETURN 9FT ADLT (ELECTROSURGICAL) ×2
ELECTRODE BLDE 4.0 EZ CLN MEGD (MISCELLANEOUS) ×1 IMPLANT
ELECTRODE REM PT RTRN 9FT ADLT (ELECTROSURGICAL) ×1 IMPLANT
FACESHIELD WRAPAROUND (MASK) ×6 IMPLANT
FACESHIELD WRAPAROUND OR TEAM (MASK) ×2 IMPLANT
GLOVE BIOGEL PI IND STRL 8 (GLOVE) ×2 IMPLANT
GLOVE BIOGEL PI INDICATOR 8 (GLOVE) ×2
GLOVE ECLIPSE 8.0 STRL XLNG CF (GLOVE) ×2 IMPLANT
GLOVE ORTHO TXT STRL SZ7.5 (GLOVE) ×3 IMPLANT
GOWN STRL REUS W/ TWL LRG LVL3 (GOWN DISPOSABLE) ×2 IMPLANT
GOWN STRL REUS W/ TWL XL LVL3 (GOWN DISPOSABLE) ×2 IMPLANT
GOWN STRL REUS W/TWL LRG LVL3 (GOWN DISPOSABLE) ×4
GOWN STRL REUS W/TWL XL LVL3 (GOWN DISPOSABLE) ×6
HANDPIECE INTERPULSE COAX TIP (DISPOSABLE) ×2
KIT BASIN OR (CUSTOM PROCEDURE TRAY) ×2 IMPLANT
KIT ROOM TURNOVER OR (KITS) ×2 IMPLANT
MANIFOLD NEPTUNE II (INSTRUMENTS) ×2 IMPLANT
NS IRRIG 1000ML POUR BTL (IV SOLUTION) ×2 IMPLANT
PACK TOTAL JOINT (CUSTOM PROCEDURE TRAY) ×2 IMPLANT
PAD ARMBOARD 7.5X6 YLW CONV (MISCELLANEOUS) ×3 IMPLANT
RETRACTOR WND ALEXIS 18 MED (MISCELLANEOUS) ×1 IMPLANT
RTRCTR WOUND ALEXIS 18CM MED (MISCELLANEOUS) ×2
SET HNDPC FAN SPRY TIP SCT (DISPOSABLE) ×1 IMPLANT
STAPLER VISISTAT 35W (STAPLE) IMPLANT
STRIP CLOSURE SKIN 1/2X4 (GAUZE/BANDAGES/DRESSINGS) ×4 IMPLANT
SUT ETHIBOND NAB CT1 #1 30IN (SUTURE) ×2 IMPLANT
SUT MNCRL AB 4-0 PS2 18 (SUTURE) ×1 IMPLANT
SUT VIC AB 0 CT1 27 (SUTURE) ×4
SUT VIC AB 0 CT1 27XBRD ANBCTR (SUTURE) ×1 IMPLANT
SUT VIC AB 1 CT1 27 (SUTURE) ×2
SUT VIC AB 1 CT1 27XBRD ANBCTR (SUTURE) ×1 IMPLANT
SUT VIC AB 2-0 CT1 27 (SUTURE) ×2
SUT VIC AB 2-0 CT1 TAPERPNT 27 (SUTURE) ×1 IMPLANT
TOWEL OR 17X24 6PK STRL BLUE (TOWEL DISPOSABLE) ×1 IMPLANT
TOWEL OR 17X26 10 PK STRL BLUE (TOWEL DISPOSABLE) ×2 IMPLANT
TRAY CATH 16FR W/PLASTIC CATH (SET/KITS/TRAYS/PACK) IMPLANT
TRAY FOLEY W/METER SILVER 16FR (SET/KITS/TRAYS/PACK) ×1 IMPLANT

## 2016-12-07 NOTE — Transfer of Care (Signed)
Immediate Anesthesia Transfer of Care Note  Patient: Rodney [name]  Procedure(s) Performed: Procedure(s): RIGHT TOTAL HIP ARTHROPLASTY ANTERIOR APPROACH (Right)  Patient Location: PACU  Anesthesia Type:Spinal and MAC combined with regional for post-op pain  Level of Consciousness: awake, alert , oriented and patient cooperative  Airway & Oxygen Therapy: Patient Spontanous Breathing and Patient connected to nasal cannula oxygen  Post-op Assessment: Report given to RN and Post -op Vital signs reviewed and stable  Post vital signs: Reviewed and stable  Last Vitals:  Vitals:   12/07/16 1122 12/07/16 1549  BP: (!) 146/74   Pulse: (!) 58   Resp: 18   Temp: 37.1 C (P) 36.8 C    Last Pain:  Vitals:   12/07/16 1549  TempSrc:   PainSc: (P) 0-No pain         Complications: No apparent anesthesia complications

## 2016-12-07 NOTE — Anesthesia Preprocedure Evaluation (Addendum)
Anesthesia Evaluation  Patient identified by MRN, date of birth, ID band Patient awake    Reviewed: Allergy & Precautions, NPO status , Patient's Chart, lab work & pertinent test results  Airway Mallampati: II  TM Distance: >3 FB Neck ROM: Full    Dental  (+) Teeth Intact, Dental Advisory Given   Pulmonary Current Smoker,    breath sounds clear to auscultation       Cardiovascular hypertension,  Rhythm:Regular Rate:Normal     Neuro/Psych    GI/Hepatic   Endo/Other    Renal/GU      Musculoskeletal   Abdominal (+) + obese,   Peds  Hematology   Anesthesia Other Findings   Reproductive/Obstetrics                            Anesthesia Physical Anesthesia Plan  ASA: III  Anesthesia Plan: MAC and Spinal   Post-op Pain Management:    Induction:   Airway Management Planned: Natural Airway and Simple Face Mask  Additional Equipment:   Intra-op Plan:   Post-operative Plan:   Informed Consent: I have reviewed the patients History and Physical, chart, labs and discussed the procedure including the risks, benefits and alternatives for the proposed anesthesia with the patient or authorized representative who has indicated his/her understanding and acceptance.   Dental advisory given  Plan Discussed with: CRNA and Anesthesiologist  Anesthesia Plan Comments:        Anesthesia Quick Evaluation
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
## 2016-12-07 NOTE — Brief Op Note (Signed)
12/07/2016  3:23 PM  PATIENT:  Rodney [name]  71 y.o. male  PRE-OPERATIVE DIAGNOSIS:  endstage arthritis right hip  POST-OPERATIVE DIAGNOSIS:  endstage arthritis right hip  PROCEDURE:  Procedure(s): RIGHT TOTAL HIP ARTHROPLASTY ANTERIOR APPROACH (Right)  SURGEON:  Surgeon(s) and Role:    * Mcarthur Rossetti, MD - Primary  PHYSICIAN ASSISTANT: Benita Stabile, PA-C  ANESTHESIA:   spinal  EBL:  Total I/O In: 1000 [I.V.:1000] Out: 635 [Urine:235; Blood:400]  COUNTS:  YES  DICTATION: .Other Dictation: Dictation Number [id number]  PLAN OF CARE: Admit to inpatient   PATIENT DISPOSITION:  PACU - hemodynamically stable.   Delay start of Pharmacological VTE agent (>24hrs) due to surgical blood loss or risk of bleeding: no

## 2016-12-08 ENCOUNTER — Encounter (HOSPITAL_COMMUNITY): Payer: Self-pay | Admitting: Orthopaedic Surgery

## 2016-12-08 LAB — CBC
HEMATOCRIT: 36 % — AB (ref 39.0–52.0)
Hemoglobin: 11.8 g/dL — ABNORMAL LOW (ref 13.0–17.0)
MCH: 32.5 pg (ref 26.0–34.0)
MCHC: 32.8 g/dL (ref 30.0–36.0)
MCV: 99.2 fL (ref 78.0–100.0)
PLATELETS: 162 10*3/uL (ref 150–400)
RBC: 3.63 MIL/uL — ABNORMAL LOW (ref 4.22–5.81)
RDW: 14.3 % (ref 11.5–15.5)
WBC: 7.2 10*3/uL (ref 4.0–10.5)

## 2016-12-08 LAB — BASIC METABOLIC PANEL
Anion gap: 9 (ref 5–15)
BUN: 12 mg/dL (ref 6–20)
CO2: 29 mmol/L (ref 22–32)
CREATININE: 0.9 mg/dL (ref 0.61–1.24)
Calcium: 8.3 mg/dL — ABNORMAL LOW (ref 8.9–10.3)
Chloride: 95 mmol/L — ABNORMAL LOW (ref 101–111)
GFR calc Af Amer: >60 mL/min (ref >60)
GFR calc non Af Amer: >60 mL/min (ref >60)
GLUCOSE: 114 mg/dL — AB (ref 65–99)
Potassium: 3.7 mmol/L (ref 3.5–5.1)
Sodium: 133 mmol/L — ABNORMAL LOW (ref 135–145)

## 2016-12-08 MED ORDER — CIPROFLOXACIN HCL 500 MG PO TABS
500.0000 mg | ORAL_TABLET | Freq: Two times a day (BID) | ORAL | Status: DC
  Administered 2016-12-08 – 2016-12-10 (×4): 500 mg via ORAL
  Filled 2016-12-08 (×4): qty 1

## 2016-12-08 NOTE — Anesthesia Postprocedure Evaluation (Addendum)
Anesthesia Post Note  Patient: Rodney [name]  Procedure(s) Performed: Procedure(s) (LRB): RIGHT TOTAL HIP ARTHROPLASTY ANTERIOR APPROACH (Right)  Patient location during evaluation: PACU Anesthesia Type: MAC Level of consciousness: awake, awake and alert and oriented Pain management: pain level controlled Vital Signs Assessment: post-procedure vital signs reviewed and stable Respiratory status: spontaneous breathing, nonlabored ventilation and respiratory function stable Cardiovascular status: blood pressure returned to baseline Anesthetic complications: no       Last Vitals:  Vitals:   12/08/16 0100 12/08/16 0455  BP: (!) 117/56 (!) 142/66  Pulse: (!) 56 71  Resp: 15 15  Temp: 37.5 C 37.2 C    Last Pain:  Vitals:   12/08/16 1104  TempSrc:   PainSc: 10-Worst pain ever                 Shreeya Recendiz COKER

## 2016-12-08 NOTE — Care Management Note (Signed)
Case Management Note  Patient Details  Name: Rodney [name] MRN: [id number] Date of Birth: 1946-03-10  Subjective/Objective:  Pt admitted on 12/07/16 for Rt THA.  PTA, pt resides at home with spouse. PT recommending [facility] services, which were arranged preoperatively.                Action/Plan: [facility] liaison aware of need for homecare at discharge.  Will follow for additional needs.    Expected Discharge Date:  12/09/16               Expected Discharge Plan:  [place]  In-House Referral:     Discharge planning Services  CM Consult  Post Acute Care Choice:    Choice offered to:     DME Arranged:    DME Agency:     HH Arranged:  PT [place]:  [facility])  Status of Service:  In process, will continue to follow  If discussed at Long Length of Stay Meetings, dates discussed:    Additional Comments:  Ella Bodo, RN 12/08/2016, 4:20 PM

## 2016-12-08 NOTE — Evaluation (Signed)
Occupational Therapy Evaluation Patient Details Name: Rodney [name] MRN: [id number] DOB: 1946-03-10 Today's Date: 12/08/2016    History of Present Illness Admitted for R THA, Dir Ant, WBAT;  has a past medical history of Adenomatous colon polyp; Aortic calcification ([place]); Arthritis; Brachial neuritis; Carpal tunnel syndrome;  Hyperlipidemia; Hypertension;    Clinical Impression   PTA Pt independent in ADL and mobility. Pt currently mod A for ADL and min A for mobility with RW (mod A for sit <>Stand), please see performance level below. Pt will benefit from skilled OT in the acute setting to maximize safety and independence in ADL and functional transfers, and will require HHOT to return to PLOF. Next session to focus on AE education and tub transfer with 3 in 1.    Follow Up Recommendations  Home health OT;Supervision/Assistance - 24 hour (initially)    Equipment Recommendations  None recommended by OT (Pt reports he has a 3 in 1 at home)    Recommendations for Other Services       Precautions / Restrictions Precautions Precautions: Fall Restrictions Weight Bearing Restrictions: Yes RLE Weight Bearing: Weight bearing as tolerated      Mobility Bed Mobility Overal bed mobility: Needs Assistance Bed Mobility: Sit to Sidelying     Supine to sit: Mod assist   Sit to sidelying: Mod assist General bed mobility comments: Cues for technqiue; at least mod assist to help LEs into bed; worked from L side of the bed this afternoon  Transfers Overall transfer level: Needs assistance Equipment used: Rolling walker (2 wheeled) Transfers: Sit to/from Stand Sit to Stand: Mod assist         General transfer comment: Mod assist to power up and steady to transition hands to RW    Balance Overall balance assessment: Needs assistance Sitting-balance support: No upper extremity supported;Feet supported Sitting balance-Leahy Scale: Good Sitting balance - Comments: sitting EOB with  no back support   Standing balance support: Bilateral upper extremity supported;During functional activity Standing balance-Leahy Scale: Poor                              ADL Overall ADL's : Needs assistance/impaired Eating/Feeding: Set up;Sitting   Grooming: Wash/dry hands;Min guard;Standing Grooming Details (indicate cue type and reason): sink level Upper Body Bathing: Set up;Sitting   Lower Body Bathing: Maximal assistance;With caregiver independent assisting   Upper Body Dressing : Set up;With caregiver independent assisting;Sitting   Lower Body Dressing: Maximal assistance;With caregiver independent assisting;Sit to/from stand   Toilet Transfer: Minimal assistance;Cueing for sequencing;Ambulation;BSC;RW Toilet Transfer Details (indicate cue type and reason): vc for safe hand placement, use of BSC over toilet Toileting- Clothing Manipulation and Hygiene: Minimal assistance;Sit to/from stand Toileting - Clothing Manipulation Details (indicate cue type and reason): hospital gown     Functional mobility during ADLs: Cueing for sequencing;Rolling walker;Minimal assistance       Vision Baseline Vision/History: Wears glasses Wears Glasses: Reading only Patient Visual Report: No change from baseline Vision Assessment?: No apparent visual deficits     Perception     Praxis      Pertinent Vitals/Pain Pain Assessment: 0-10 Pain Score: 8  Pain Location: R hip Pain Descriptors / Indicators: Aching;Sore Pain Intervention(s): Monitored during session;Repositioned;Ice applied     Hand Dominance Right   Extremity/Trunk Assessment Upper Extremity Assessment Upper Extremity Assessment: Overall WFL for tasks assessed   Lower Extremity Assessment Lower Extremity Assessment: Defer to PT evaluation RLE Deficits /  Details: Grossly decr AROM and strength, limited by pain and body habitus postop LLE Deficits / Details: Noting grossl decr Hip AROM, limited by body  habitus       Communication Communication Communication: No difficulties   Cognition Arousal/Alertness: Awake/alert Behavior During Therapy: WFL for tasks assessed/performed Overall Cognitive Status: Within Functional Limits for tasks assessed                 General Comments: Pt reporting less ability to attend to tasks, and more need to repeat commands this afternoon in comparison to this morning's session   General Comments  Applied a pink foam dressing to small pressure area on buttocks; Notified RN of pt reporting burning sensation when peeing    Exercises       Shoulder Instructions      Home Living Family/patient expects to be discharged to:: Private residence Living Arrangements: Spouse/significant other Available Help at Discharge: Family;Available 24 hours/day Type of Home: House Home Access: Stairs to enter [employer] of Steps: 1 Entrance Stairs-Rails: None Home Layout: One level     Bathroom Shower/Tub: Tub/shower unit Shower/tub characteristics: Architectural technologist: Standard Bathroom Accessibility: Yes How Accessible: Accessible via walker Home Equipment: Grab bars - tub/shower;Bedside commode          Prior Functioning/Environment Level of Independence: Independent                 OT Problem List: Decreased strength;Decreased range of motion;Decreased activity tolerance;Impaired balance (sitting and/or standing);Decreased safety awareness;Decreased knowledge of use of DME or AE;Obesity;Pain      OT Treatment/Interventions: Self-care/ADL training;Energy conservation;DME and/or AE instruction;Therapeutic activities;Patient/family education;Balance training    OT Goals(Current goals can be found in the care plan section) Acute Rehab OT Goals Patient Stated Goal: be able to walk without pain OT Goal Formulation: With patient Time For Goal Achievement: 12/22/16 Potential to Achieve Goals: Good ADL Goals Pt Will Perform  Lower Body Bathing: with min assist;with caregiver independent in assisting;sitting/lateral leans Pt Will Perform Lower Body Dressing: with min assist;with caregiver independent in assisting;sit to/from stand Pt Will Transfer to Toilet: with supervision;bedside commode;ambulating (with RW, with caregiver independent in assisting) Pt Will Perform Toileting - Clothing Manipulation and hygiene: sit to/from stand;with supervision Pt Will Perform Tub/Shower Transfer: Tub transfer;with min guard assist;with caregiver independent in assisting;anterior/posterior transfer;3 in 1;rolling walker  OT Frequency: Min 3X/week   Barriers to D/C:            Co-evaluation PT/OT/SLP Co-Evaluation/Treatment: Yes Reason for Co-Treatment: For patient/therapist safety;Other (comment) (pt-centered decision based in his activity tolerance) PT goals addressed during session: Mobility/safety with mobility OT goals addressed during session: ADL's and self-care      End of Session Equipment Utilized During Treatment: Gait belt;Rolling walker Nurse Communication: Mobility status;Weight bearing status  Activity Tolerance: Patient tolerated treatment well Patient left: in bed;with call bell/phone within reach;with family/visitor present  OT Visit Diagnosis: Other abnormalities of gait and mobility (R26.89)                ADL either performed or assessed with clinical judgement  Time: 1419-1458 OT Time Calculation (min): 39 min Charges:  OT General Charges $OT Visit: 1 Procedure OT Evaluation $OT Eval Moderate Complexity: 1 Procedure OT Treatments $Self Care/Home Management : 8-22 mins G-Codes:     Hulda Humphrey OTR/L [place] 12/08/2016, 5:26 PM

## 2016-12-08 NOTE — Op Note (Signed)
NAME:  Rodney [name], Rodney [name]                  ACCOUNT NO.:  MEDICAL RECORD NO.:  [id number]  LOCATION:                                 FACILITY:  PHYSICIAN:  Lind Guest. Ninfa Linden, M.D.DATE OF BIRTH:  DATE OF PROCEDURE:  12/07/2016 DATE OF DISCHARGE:                              OPERATIVE REPORT   PREOPERATIVE DIAGNOSIS:  Primary osteoarthritis and degenerative joint disease, right hip.  POSTOPERATIVE DIAGNOSIS:  Primary osteoarthritis and degenerative joint disease, right hip.  PROCEDURE:  Right total hip arthroplasty through direct anterior approach.  IMPLANTS:  DePuy Sector Gription acetabular component size 58, a single screw, size 36+ 4 neutral polyethylene liner for a size 58 acetabular component, size 13 Corail femoral component with varus offset, size 36+ 8.5 ceramic hip ball.  SURGEON:  Mcarthur Rossetti, M.D.  ASSISTANT:  Erskine Emery, PA-C.  ANESTHESIA:  Spinal.  ANTIBIOTICS:  2 g of IV Ancef.  BLOOD LOSS:  400-500 mL.  COMPLICATIONS:  None.  INDICATIONS:  Rodney [name] is a 71 year old gentleman, well known to me. He has had debilitating arthritis for some time now involving his right hip.  This has been verified under clinical exam and fluoroscopy.  His plain films also showed severe arthritis.  He has had several intra- articular injections, temporize things.  At this point, his pain is detrimentally effecting his activities of daily living, his quality of life, and his mobility.  He wished to proceed with a total hip arthroplasty.  He fully understands the risks of acute blood loss anemia, nerve and vessel injury, fracture, infection, dislocation, DVT. He understands our goals are decreased pain, improved mobility, and overall improved quality of life.  PROCEDURE DESCRIPTION:  After informed consent was obtained, appropriate right hip was marked.  He was brought to the operating room where spinal anesthesia was obtained while he was on a stretcher.   He was then laid in supine position on the stretcher.  Foley catheter was placed and both feet had traction boots applied to them.  Next, he was placed supine on the Hana fracture table with perineal post in place and both legs in inline skeletal traction, but no traction applied.  Preoperatively, he was noted to be significantly short on his right operative side comparing his right and left.  His right hip was prepped and draped with DuraPrep and sterile drapes.  Time-out was called.  He was identified as correct patient and correct right hip.  We then made an incision just inferior and posterior to the anterosuperior iliac spine and carried this obliquely down the leg.  I dissected down to the tensor fascia lata muscle and tensor fascia was then divided longitudinally to proceed with a direct anterior approach to the hip.  We identified and cauterized the circumflex vessels and identified the hip capsule.  We opened up the hip capsule in an L-type format finding a large joint effusion and significant arthritis in the right hip.  We placed Cobra retractors around the medial and lateral femoral neck and then made our femoral neck cut with the oscillating saw proximal to the lesser trochanter and completed this with an osteotome.  We placed a corkscrew guide  in the femoral head and found the femoral head that can be completely devoid of cartilage.  We then placed a bent Hohmann over the medial acetabular rim and removed remnants of acetabular labrum.  We then began reaming from a size 42 reamer and jumping in increments all the way up to size 58 with all reamers under direct visualization.  The last 2 reamers under direct fluoroscopy, so we could obtain our depth of reaming, our inclination, and anteversion.  Once we were pleased with this, we placed the real DePuy Sector Gription acetabular component size 58 and a single screw. We placed a 36+ 4 polyethylene liner for that size acetabular  component. Attention was then turned to the femur.  With the leg externally rotated to 120 degrees, extended and adducted, we were able to use a Mueller retractor medially and a Hohmann retractor behind the greater trochanter.  We released the lateral joint capsule and used a box cutting osteotome in the inner femoral canal and a rongeur to lateralize.  We then began broaching from a size 8 broach going all the way up to a size 13.  With a size 13, we trialed a standard offset femoral neck and a 36+ 5 hip ball.  We reduced this in acetabulum.  With the hip was stable, we felt like we needed a little bit more leg length. We dislocated the hip and removed the trial components.  We were able to place the real Corail femoral component with standard offset, size 13 and the real 36+ 8.5 hip ball reduced this in acetabulum and it was stable and we were pleased with leg length and offset.  We then irrigated the soft tissue with normal saline solution using pulsatile lavage.  We were able to close the joint capsule with interrupted #1 Ethibond suture followed by running #1 Vicryl in the tensor fascia, 0 Vicryl in the deep tissue, 2-0 Vicryl in the subcutaneous tissue, 4-0 Monocryl subcuticular stitch and Steri-Strips on the skin.  Aquacel dressing was applied.  He was taken off the Hana table and taken to the recovery room in stable condition.  All final counts were correct. There were no complications noted.  Of note, Erskine Emery, PA-C, assisted in the entire case and assistance was crucial for facilitating all aspects of this case.     Lind Guest. Ninfa Linden, M.D.     CYB/MEDQ  D:  12/07/2016  T:  12/07/2016  Job:  [id number]
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
## 2016-12-08 NOTE — Progress Notes (Signed)
Pt complaining of discomfort/burning with urination as well as  discolored urine. Paged Dr. Renita Papa with information and received orders for Ciprofloxacin 500 mg PO BID. Orders placed and pt updated. Will continue to monitor

## 2016-12-08 NOTE — Progress Notes (Signed)
Subjective: 1 Day Post-Op Procedure(s) (LRB): RIGHT TOTAL HIP ARTHROPLASTY ANTERIOR APPROACH (Right) Patient reports pain as moderate.    Objective: Vital signs in last 24 hours: Temp:  [98 F (36.7 C)-99.5 F (37.5 C)] 98.9 F (37.2 C) (03/14 0455) Pulse Rate:  [49-71] 71 (03/14 0455) Resp:  [10-18] 15 (03/14 0455) BP: (102-146)/(56-77) 142/66 (03/14 0455) SpO2:  [95 %-99 %] 98 % (03/14 0455) Weight:  [227 lb 8 oz (103.2 kg)] 227 lb 8 oz (103.2 kg) (03/13 1122)  Intake/Output from previous day: 03/13 0701 - 03/14 0700 In: 1952.4 [P.O.:390; I.V.:1562.4] Out: 1685 [Urine:1285; Blood:400] Intake/Output this shift: No intake/output data recorded.  No results for input(s): HGB in the last 72 hours. No results for input(s): WBC, RBC, HCT, PLT in the last 72 hours. No results for input(s): NA, K, CL, CO2, BUN, CREATININE, GLUCOSE, CALCIUM in the last 72 hours. No results for input(s): LABPT, INR in the last 72 hours.  Sensation intact distally Intact pulses distally Dorsiflexion/Plantar flexion intact Incision: dressing C/D/I  Assessment/Plan: 1 Day Post-Op Procedure(s) (LRB): RIGHT TOTAL HIP ARTHROPLASTY ANTERIOR APPROACH (Right) Up with therapy  Rodney [name] 12/08/2016, 7:12 AM
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
## 2016-12-08 NOTE — Anesthesia Procedure Notes (Signed)
Spinal  Patient location during procedure: OR Start time: 12/07/2016 1:50 PM End time: 12/08/2016 1:55 PM Staffing Anesthesiologist: Linna Caprice, Fina Heizer Performed: anesthesiologist  Preanesthetic Checklist Completed: patient identified, site marked, surgical consent, pre-op evaluation, timeout performed, IV checked, risks and benefits discussed and monitors and equipment checked Spinal Block Patient position: sitting Prep: ChloraPrep Patient monitoring: heart rate, cardiac monitor, continuous pulse ox and blood pressure Approach: midline Location: L3-4 Injection technique: single-shot Needle Needle gauge: 22 G Needle insertion depth: 5 cm Assessment Sensory level: T6 Additional Notes 14 mg 0.75% Bupivacaine injected easily

## 2016-12-09 MED ORDER — BISACODYL 10 MG RE SUPP
10.0000 mg | Freq: Once | RECTAL | Status: AC
  Administered 2016-12-09: 10 mg via RECTAL
  Filled 2016-12-09: qty 1

## 2016-12-09 MED ORDER — CIPROFLOXACIN HCL 500 MG PO TABS: 500.0000 mg | ORAL_TABLET | Freq: Two times a day (BID) | ORAL | 0 refills | Status: AC

## 2016-12-09 MED ORDER — OXYCODONE-ACETAMINOPHEN 5-325 MG PO TABS: 1.0000 | ORAL_TABLET | ORAL | 0 refills | Status: DC | PRN

## 2016-12-09 NOTE — Progress Notes (Signed)
Physical Therapy Treatment Patient Details Name: Rodney [name] MRN: [id number] DOB: 31-Aug-1946 Today's Date: 12/09/2016    History of Present Illness Admitted for R THA, Dir Ant, WBAT;  has a past medical history of Adenomatous colon polyp; Aortic calcification ([place]); Arthritis; Brachial neuritis; Carpal tunnel syndrome;  Hyperlipidemia; Hypertension;     PT Comments    Patient is progressing toward mobility goals. Continue to progress as tolerated with anticipated d/c home with HHPT.    Follow Up Recommendations  Home health PT;Supervision/Assistance - 24 hour     Equipment Recommendations  Rolling walker with 5" wheels;3in1 (PT)    Recommendations for Other Services OT consult     Precautions / Restrictions Precautions Precautions: Fall Restrictions Weight Bearing Restrictions: Yes RLE Weight Bearing: Weight bearing as tolerated    Mobility  Bed Mobility               General bed mobility comments: pt OOB in chair upon arrival  Transfers Overall transfer level: Needs assistance Equipment used: Rolling walker (2 wheeled) Transfers: Sit to/from Stand Sit to Stand: Min guard         General transfer comment: min guard for safety; cues for safe hand placement with carry over demonstrated  Ambulation/Gait Ambulation/Gait assistance: Min guard Ambulation Distance (Feet): 150 Feet Assistive device: Rolling walker (2 wheeled) Gait Pattern/deviations: Step-through pattern;Decreased stance time - right;Decreased step length - left;Decreased weight shift to right;Antalgic Gait velocity: decreased   General Gait Details: mild antalgic gait; improved step through and weight bearing this session; cues for posture and proximity of RW   Stairs            Wheelchair Mobility    Modified Rankin (Stroke Patients Only)       Balance Overall balance assessment: Needs assistance Sitting-balance support: No upper extremity supported;Feet  supported Sitting balance-Leahy Scale: Good     Standing balance support: Bilateral upper extremity supported;During functional activity Standing balance-Leahy Scale: Poor                      Cognition Arousal/Alertness: Awake/alert Behavior During Therapy: WFL for tasks assessed/performed Overall Cognitive Status: Within Functional Limits for tasks assessed                      Exercises      General Comments        Pertinent Vitals/Pain Pain Assessment: Faces Faces Pain Scale: Hurts little more Pain Location: R hip Pain Descriptors / Indicators: Sore;Guarding Pain Intervention(s): Monitored during session;Premedicated before session;Repositioned    Home Living                      Prior Function            PT Goals (current goals can now be found in the care plan section) Acute Rehab PT Goals Patient Stated Goal: none stated Progress towards PT goals: Progressing toward goals    Frequency    7X/week      PT Plan Current plan remains appropriate    Co-evaluation             End of Session Equipment Utilized During Treatment: Gait belt Activity Tolerance: Patient tolerated treatment well Patient left: in chair;with call bell/phone within reach Nurse Communication: Mobility status PT Visit Diagnosis: Unsteadiness on feet (R26.81);Pain Pain - Right/Left: Right Pain - part of body: Hip     Time: 1020-1047 PT Time Calculation (min) (ACUTE ONLY): 27 min  Charges:  $Gait Training: 8-22 mins $Therapeutic Activity: 8-22 mins                    G Codes:       Salina April, PTA Pager: [phone]   12/09/2016, 1:37 PM

## 2016-12-09 NOTE — Progress Notes (Signed)
Physical Therapy Treatment Patient Details Name: Rodney [name] MRN: [id number] DOB: 01/26/1946 Today's Date: 12/09/2016    History of Present Illness Admitted for R THA, Dir Ant, WBAT;  has a past medical history of Adenomatous colon polyp; Aortic calcification ([place]); Arthritis; Brachial neuritis; Carpal tunnel syndrome;  Hyperlipidemia; Hypertension;     PT Comments    Patient able to complete HEP with assistance and verbal cues. Encouraged to complete HEP this evening minus the standing therex.  Continue to progress as tolerated with anticipated d/c home with HHPT.   Follow Up Recommendations  Home health PT;Supervision/Assistance - 24 hour     Equipment Recommendations  Rolling walker with 5" wheels;3in1 (PT)    Recommendations for Other Services OT consult     Precautions / Restrictions Precautions Precautions: Fall Restrictions Weight Bearing Restrictions: Yes RLE Weight Bearing: Weight bearing as tolerated    Mobility  Bed Mobility               General bed mobility comments: pt OOB in chair upon arrival  Transfers Overall transfer level: Needs assistance Equipment used: Rolling walker (2 wheeled) Transfers: Sit to/from Stand Sit to Stand: Min guard         General transfer comment: min guard for safety; safe hand placement demonstrated  Ambulation/Gait Ambulation/Gait assistance: Min guard Ambulation Distance (Feet): 150 Feet Assistive device: Rolling walker (2 wheeled) Gait Pattern/deviations: Step-through pattern;Decreased stance time - right;Decreased step length - left;Decreased weight shift to right;Antalgic Gait velocity: decreased   General Gait Details: mild antalgic gait; improved step through and weight bearing this session; cues for posture and proximity of RW   Stairs            Wheelchair Mobility    Modified Rankin (Stroke Patients Only)       Balance Overall balance assessment: Needs assistance Sitting-balance  support: No upper extremity supported;Feet supported Sitting balance-Leahy Scale: Good     Standing balance support: Bilateral upper extremity supported;During functional activity Standing balance-Leahy Scale: Poor                      Cognition Arousal/Alertness: Awake/alert Behavior During Therapy: WFL for tasks assessed/performed Overall Cognitive Status: Within Functional Limits for tasks assessed                      Exercises Total Joint Exercises Ankle Circles/Pumps: AROM;Both;10 reps Quad Sets: AROM;Both;10 reps Short Arc Quad: AROM;Right;10 reps Heel Slides: AAROM;Right;10 reps Hip ABduction/ADduction: AAROM;Right;Other reps (comment);Seated;Standing (10 standing and 10 seated) Long Arc Quad: AROM;Right;10 reps Knee Flexion: AROM;Right;10 reps;Standing Marching in Standing: AROM;Right;10 reps;Standing Standing Hip Extension: AROM;Right;10 reps;Standing    General Comments        Pertinent Vitals/Pain Pain Assessment: Faces Faces Pain Scale: Hurts even more Pain Location: R hip Pain Descriptors / Indicators: Sore;Guarding Pain Intervention(s): Monitored during session;Premedicated before session;Repositioned;Ice applied    Home Living                      Prior Function            PT Goals (current goals can now be found in the care plan section) Acute Rehab PT Goals Patient Stated Goal: none stated Progress towards PT goals: Progressing toward goals    Frequency    7X/week      PT Plan Current plan remains appropriate    Co-evaluation             End  of Session Equipment Utilized During Treatment: Gait belt Activity Tolerance: Patient tolerated treatment well Patient left: in chair;with call bell/phone within reach Nurse Communication: Mobility status PT Visit Diagnosis: Unsteadiness on feet (R26.81);Pain Pain - Right/Left: Right Pain - part of body: Hip     Time: 1324-4010 PT Time Calculation (min) (ACUTE  ONLY): 43 min  Charges:   $Therapeutic Exercise: 23-37 mins $Therapeutic Activity: 8-22 mins                    G Codes:       Salina April, PTA Pager: [phone]   12/09/2016, 3:24 PM

## 2016-12-09 NOTE — Discharge Instructions (Signed)
INSTRUCTIONS AFTER JOINT REPLACEMENT   o Remove items at home which could result in a fall. This includes throw rugs or furniture in walking pathways o ICE to the affected joint every three hours while awake for 30 minutes at a time, for at least the first 3-5 days, and then as needed for pain and swelling.  Continue to use ice for pain and swelling. You may notice swelling that will progress down to the foot and ankle.  This is normal after surgery.  Elevate your leg when you are not up walking on it.   o Continue to use the breathing machine you got in the hospital (incentive spirometer) which will help keep your temperature down.  It is common for your temperature to cycle up and down following surgery, especially at night when you are not up moving around and exerting yourself.  The breathing machine keeps your lungs expanded and your temperature down.   DIET:  As you were doing prior to hospitalization, we recommend a well-balanced diet.  DRESSING / WOUND CARE / SHOWERING You may shower with dressing intact . May change your dressing out in 3-5 days than replace with extra Aquacel  dressing given at time of discharge . Leave this dressing intact until follow up appointment.    ACTIVITY  o Increase activity slowly as tolerated, but follow the weight bearing instructions below.   o No driving for 6 weeks or until further direction given by your physician.  You cannot drive while taking narcotics.  o No lifting or carrying greater than 10 lbs. until further directed by your surgeon. o Avoid periods of inactivity such as sitting longer than an hour when not asleep. This helps prevent blood clots.  o You may return to work once you are authorized by your doctor.     WEIGHT BEARING   Weight bearing as tolerated with assist device (walker, cane, etc) as directed, use it as long as suggested by your surgeon or therapist, typically at least 4-6 weeks.   EXERCISES  Results after joint  replacement surgery are often greatly improved when you follow the exercise, range of motion and muscle strengthening exercises prescribed by your doctor. Safety measures are also important to protect the joint from further injury. Any time any of these exercises cause you to have increased pain or swelling, decrease what you are doing until you are comfortable again and then slowly increase them. If you have problems or questions, call your caregiver or physical therapist for advice.   Rehabilitation is important following a joint replacement. After just a few days of immobilization, the muscles of the leg can become weakened and shrink (atrophy).  These exercises are designed to build up the tone and strength of the thigh and leg muscles and to improve motion. Often times heat used for twenty to thirty minutes before working out will loosen up your tissues and help with improving the range of motion but do not use heat for the first two weeks following surgery (sometimes heat can increase post-operative swelling).   These exercises can be done on a training (exercise) mat, on the floor, on a table or on a bed. Use whatever works the best and is most comfortable for you.    Use music or television while you are exercising so that the exercises are a pleasant break in your day. This will make your life better with the exercises acting as a break in your routine that you can look forward to.  Perform all exercises about fifteen times, three times per day or as directed.  You should exercise both the operative leg and the other leg as well.  Exercises include:    Quad Sets - Tighten up the muscle on the front of the thigh (Quad) and hold for 5-10 seconds.    Straight Leg Raises - With your knee straight (if you were given a brace, keep it on), lift the leg to 60 degrees, hold for 3 seconds, and slowly lower the leg.  Perform this exercise against resistance later as your leg gets stronger.   Leg Slides:  Lying on your back, slowly slide your foot toward your buttocks, bending your knee up off the floor (only go as far as is comfortable). Then slowly slide your foot back down until your leg is flat on the floor again.   Angel Wings: Lying on your back spread your legs to the side as far apart as you can without causing discomfort.   Hamstring Strength:  Lying on your back, push your heel against the floor with your leg straight by tightening up the muscles of your buttocks.  Repeat, but this time bend your knee to a comfortable angle, and push your heel against the floor.  You may put a pillow under the heel to make it more comfortable if necessary.   A rehabilitation program following joint replacement surgery can speed recovery and prevent re-injury in the future due to weakened muscles. Contact your doctor or a physical therapist for more information on knee rehabilitation.    CONSTIPATION  Constipation is defined medically as fewer than three stools per week and severe constipation as less than one stool per week.  Even if you have a regular bowel pattern at home, your normal regimen is likely to be disrupted due to multiple reasons following surgery.  Combination of anesthesia, postoperative narcotics, change in appetite and fluid intake all can affect your bowels.   YOU MUST use at least one of the following options; they are listed in order of increasing strength to get the job done.  They are all available over the counter, and you may need to use some, POSSIBLY even all of these options:    Drink plenty of fluids (prune juice may be helpful) and high fiber foods Colace 100 mg by mouth twice a day  Senokot for constipation as directed and as needed Dulcolax (bisacodyl), take with full glass of water  Miralax (polyethylene glycol) once or twice a day as needed.  If you have tried all these things and are unable to have a bowel movement in the first 3-4 days after surgery call either your  surgeon or your primary doctor.    If you experience loose stools or diarrhea, hold the medications until you stool forms back up.  If your symptoms do not get better within 1 week or if they get worse, check with your doctor.  If you experience "the worst abdominal pain ever" or develop nausea or vomiting, please contact the office immediately for further recommendations for treatment.   ITCHING:  If you experience itching with your medications, try taking only a single pain pill, or even half a pain pill at a time.  You can also use Benadryl over the counter for itching or also to help with sleep.   TED HOSE STOCKINGS:  Use stockings on both legs until for at least 2 weeks or as directed by physician office. They may be removed at night for  sleeping. ° °MEDICATIONS:  See your medication summary on the “After Visit Summary” that nursing will review with you.  You may have some home medications which will be placed on hold until you complete the course of blood thinner medication.  It is important for you to complete the blood thinner medication as prescribed. ° °PRECAUTIONS:  If you experience chest pain or shortness of breath - call 911 immediately for transfer to the hospital emergency department.  ° °If you develop a fever greater that 101 F, purulent drainage from wound, increased redness or drainage from wound, foul odor from the wound/dressing, or calf pain - CONTACT YOUR SURGEON.   °                                                °FOLLOW-UP APPOINTMENTS:  If you do not already have a post-op appointment, please call the office for an appointment to be seen by your surgeon.  Guidelines for how soon to be seen are listed in your “After Visit Summary”, but are typically between 1-4 weeks after surgery. ° °OTHER INSTRUCTIONS:  ° °Knee Replacement:  Do not place pillow under knee, focus on keeping the knee straight while resting. CPM instructions: 0-90 degrees, 2 hours in the morning, 2 hours in the  afternoon, and 2 hours in the evening. Place foam block, curve side up under heel at all times except when in CPM or when walking.  DO NOT modify, tear, cut, or change the foam block in any way. ° °MAKE SURE YOU:  °• Understand these instructions.  °• Get help right away if you are not doing well or get worse.  ° ° °Thank you for letting us be a part of your medical care team.  It is a privilege we respect greatly.  We hope these instructions will help you stay on track for a fast and full recovery!  ° ° ° °

## 2016-12-09 NOTE — Progress Notes (Signed)
Occupational Therapy Treatment Patient Details Name: CAYLE THUNDER MRN: [id number] DOB: May 02, 1946 Today's Date: 12/09/2016    History of present illness Admitted for R THA, Dir Ant, WBAT;  has a past medical history of Adenomatous colon polyp; Aortic calcification ([place]); Arthritis; Brachial neuritis; Carpal tunnel syndrome;  Hyperlipidemia; Hypertension;    OT comments  Patient was educated on use of ae for LE adls. Patient seems that his medication is making it harder to follow and retain directions. [name] was educated on proper technique for supine to sit and sit to stand with min assist requried. pnt. Was CGA to amb to 3-1 commode in bathroom. Patient was cga with sit to stand and stand to sit from 3-1. Patient was able to stand at sink for grooming at S level. Patient deferred today to amb to gym to perform tulb transer. Patient wants to be seen tomorrow to go over hip kit and to work on tub transfer.   Follow Up Recommendations  Home health OT    Equipment Recommendations       Recommendations for Other Services      Precautions / Restrictions Precautions Precautions: Fall Restrictions RLE Weight Bearing: Weight bearing as tolerated       Mobility Bed Mobility         Supine to sit: Min assist     General bed mobility comments: cues for technique  Transfers       Sit to Stand: Min assist         General transfer comment: cues for proper hand placement     Balance                                   ADL Overall ADL's : Needs assistance/impaired     Grooming: Wash/dry hands;Wash/dry face;Supervision/safety               Lower Body Dressing: Minimal assistance;Moderate assistance;With adaptive equipment   Toilet Transfer: Min guard   Toileting- Clothing Manipulation and Hygiene: Minimal assistance       Functional mobility during ADLs: Minimal assistance General ADL Comments:  (patient was educated on use of ae)       Tourist information centre manager   Behavior During Therapy: WFL for tasks assessed/performed Overall Cognitive Status: Within Functional Limits for tasks assessed                  General Comments: patient was requiring extra cues to follow directions.       Exercises     Shoulder Instructions       General Comments      Pertinent Vitals/ Pain       Pain Assessment: 0-10 Pain Score: 6  Pain Location:  (r hip) Pain Descriptors / Indicators: Aching;Sore Pain Intervention(s): Premedicated before session  Home Living                                          Prior Functioning/Environment              Frequency           Progress Toward Goals  OT Goals(current goals can  now be found in the care plan section)        Plan      Co-evaluation                 End of Session Equipment Utilized During Treatment: Gait belt;Rolling walker      Activity Tolerance Patient tolerated treatment well   Patient Left in chair;with call bell/phone within reach   Nurse Communication Mobility status        Time: 4158-3094 OT Time Calculation (min): 38 min  Charges: OT General Charges $OT Visit: 1 Procedure OT Treatments $Self Care/Home Management : 07-68 mins   Click score 18, clinical judgement.    Lijah Bourque 12/09/2016, 9:32 AM

## 2016-12-09 NOTE — Progress Notes (Signed)
Subjective: 2 Days Post-Op Procedure(s) (LRB): RIGHT TOTAL HIP ARTHROPLASTY ANTERIOR APPROACH (Right) Patient reports pain as mild to moderate.   Complaining of no bowel movement. Objective: Vital signs in last 24 hours: Temp:  [98 F (36.7 C)-99.1 F (37.3 C)] 98 F (36.7 C) (03/15 0532) Pulse Rate:  [52-72] 72 (03/15 0800) Resp:  [16-20] 20 (03/15 0532) BP: (102-147)/(57-66) 125/61 (03/15 0800) SpO2:  [92 %-96 %] 94 % (03/15 0532)  Intake/Output from previous day: 03/14 0701 - 03/15 0700 In: 720 [P.O.:720] Out: 500 [Urine:500] Intake/Output this shift: Total I/O In: 240 [P.O.:240] Out: 375 [Urine:375]   Recent Labs  12/08/16 0626  HGB 11.8*    Recent Labs  12/08/16 0626  WBC 7.2  RBC 3.63*  HCT 36.0*  PLT 162    Recent Labs  12/08/16 0626  NA 133*  K 3.7  CL 95*  CO2 29  BUN 12  CREATININE 0.90  GLUCOSE 114*  CALCIUM 8.3*   No results for input(s): LABPT, INR in the last 72 hours.   Right lower extremity: Intact pulses distally Dorsiflexion/Plantar flexion intact Incision: dressing C/D/I Compartment soft  Assessment/Plan: 2 Days Post-Op Procedure(s) (LRB): RIGHT TOTAL HIP ARTHROPLASTY ANTERIOR APPROACH (Right) Up with therapy  Suppository today Plan for discharge to home tomorrow   Erskine Emery 12/09/2016, 10:21 AM

## 2016-12-10 NOTE — Progress Notes (Signed)
Patient ID: Rodney [name], male   DOB: Mar 07, 1946, 71 y.o.   MRN: [id number] Doing well.  Can be discharged to home today.

## 2016-12-10 NOTE — Progress Notes (Signed)
Occupational Therapy Treatment Patient Details Name: Rodney [name] MRN: [id number] DOB: 1946-03-15 Today's Date: 12/10/2016    History of present illness Admitted for R THA, Dir Ant, WBAT;  has a past medical history of Adenomatous colon polyp; Aortic calcification ([place]); Arthritis; Brachial neuritis; Carpal tunnel syndrome;  Hyperlipidemia; Hypertension;    OT comments  Focus of session on educating pt and wife in tub transfer using 3 in 1. Reinforced education with handout. Instructed pt and wife in fall prevention at home. Pt is eager to go home today.  Follow Up Recommendations  Home health OT    Equipment Recommendations  None recommended by OT    Recommendations for Other Services      Precautions / Restrictions Precautions Precautions: Fall Restrictions Weight Bearing Restrictions: Yes RLE Weight Bearing: Weight bearing as tolerated       Mobility Bed Mobility      General bed mobility comments: pt in chair  Transfers Overall transfer level: Needs assistance Equipment used: Rolling walker (2 wheeled) Transfers: Sit to/from Stand Sit to Stand: Min guard         General transfer comment: min guard for safety; safe hand placement demonstrated    Balance Overall balance assessment: Needs assistance Sitting-balance support: No upper extremity supported;Feet supported Sitting balance-Leahy Scale: Good     Standing balance support: Bilateral upper extremity supported;During functional activity Standing balance-Leahy Scale: Poor                     ADL Overall ADL's : Needs assistance/impaired                           Toilet Transfer Details (indicate cue type and reason): educated pt and wife in use of 3 in 1 over toilet     Tub/ Shower Transfer: Tub transfer;Ambulation;3 in Mudlogger Details (indicate cue type and reason): educated pt and wife in tub transfer onto 3 in 1, how to set up 3 in 1   General  ADL Comments: Pt will rely on his wife to assist with LB bathing and dressing, not on AE. Pt and wife educated in safe footwear and how to transport items with RW. Instructed to remove environmental barriers at home.      Vision                     Perception     Praxis      Cognition   Behavior During Therapy: WFL for tasks assessed/performed Overall Cognitive Status: Within Functional Limits for tasks assessed                  General Comments: slow processing      Exercises    Shoulder Instructions       General Comments      Pertinent Vitals/ Pain       Pain Assessment: Faces Faces Pain Scale: Hurts little more Pain Location: R hip Pain Descriptors / Indicators: Sore;Guarding Pain Intervention(s): Ice applied;Repositioned;Monitored during session  Home Living                                          Prior Functioning/Environment              Frequency  Min 3X/week        Progress Toward Goals  OT Goals(current goals can now be found in the care plan section)  Progress towards OT goals: Progressing toward goals  Acute Rehab OT Goals Patient Stated Goal: none stated Time For Goal Achievement: 12/22/16 Potential to Achieve Goals: Good  Plan Discharge plan remains appropriate    Co-evaluation                 End of Session Equipment Utilized During Treatment: Gait belt;Rolling walker  OT Visit Diagnosis: Other abnormalities of gait and mobility (R26.89);Pain Pain - Right/Left: Right Pain - part of body: Hip   Activity Tolerance Patient tolerated treatment well   Patient Left in chair;with call bell/phone within reach;with family/visitor present   Nurse Communication          Time: 6295-2841 OT Time Calculation (min): 36 min  Charges: OT General Charges $OT Visit: 1 Procedure OT Treatments $Self Care/Home Management : 23-37 mins     Malka So 12/10/2016, 11:12 AM   [phone]

## 2016-12-10 NOTE — Discharge Summary (Signed)
Patient ID: Rodney [name] MRN: [id number] DOB/AGE: 72-Jan-1947 72 y.o.  Admit date: 12/07/2016 Discharge date: 12/10/2016  Admission Diagnoses:  Principal Problem:   Unilateral primary osteoarthritis, right hip Active Problems:   Status post total replacement of right hip   Discharge Diagnoses:  Same  Past Medical History:  Diagnosis Date  . Adenomatous colon polyp   . Aortic calcification ([facility])    pateint unaware no one has told patient  . Arthritis   . Brachial neuritis   . Carpal tunnel syndrome    probable right carpal tunnel syndrome last year   . Dyspnea    at times  . History of hiatal hernia   . Hyperlipidemia   . Hypertension   . Night sweats    patient denies  . Sleep apnea    15 years ago from 12/01/16    Surgeries: Procedure(s): RIGHT TOTAL HIP ARTHROPLASTY ANTERIOR APPROACH on 12/07/2016   Consultants:   Discharged Condition: Improved  Hospital Course: Rodney [name] is an 71 y.o. male who was admitted 12/07/2016 for operative treatment ofUnilateral primary osteoarthritis, right hip. Patient has severe unremitting pain that affects sleep, daily activities, and work/hobbies. After pre-op clearance the patient was taken to the operating room on 12/07/2016 and underwent  Procedure(s): RIGHT TOTAL HIP ARTHROPLASTY ANTERIOR APPROACH.    Patient was given perioperative antibiotics: Anti-infectives    Start     Dose/Rate Route Frequency Ordered Stop   12/09/16 0000  ciprofloxacin (CIPRO) 500 MG tablet     500 mg Oral 2 times daily 12/09/16 1033 12/13/16 2359   12/08/16 2000  ciprofloxacin (CIPRO) tablet 500 mg     500 mg Oral 2 times daily 12/08/16 1746     12/07/16 2000  ceFAZolin (ANCEF) IVPB 1 g/50 mL premix     1 g 100 mL/hr over 30 Minutes Intravenous Every 6 hours 12/07/16 1755 12/08/16 0156   12/07/16 1300  ceFAZolin (ANCEF) IVPB 2g/100 mL premix     2 g 200 mL/hr over 30 Minutes Intravenous To [facility] 12/06/16 0742 12/07/16 1402        Patient was given sequential compression devices, early ambulation, and chemoprophylaxis to prevent DVT.  Patient benefited maximally from hospital stay and there were no complications.    Recent vital signs: Patient Vitals for the past 24 hrs:  BP Temp Temp src Pulse Resp SpO2  12/10/16 0432 104/61 98.5 F (36.9 C) Oral 61 16 96 %  12/10/16 0121 - 98.8 F (37.1 C) Oral - - -  12/09/16 2123 130/64 (!) 100.5 F (38.1 C) Oral 77 16 97 %  12/09/16 1200 128/62 - - 66 - -  12/09/16 0800 125/61 - - 72 - -     Recent laboratory studies:  Recent Labs  12/08/16 0626  WBC 7.2  HGB 11.8*  HCT 36.0*  PLT 162  NA 133*  K 3.7  CL 95*  CO2 29  BUN 12  CREATININE 0.90  GLUCOSE 114*  CALCIUM 8.3*     Discharge Medications:   Allergies as of 12/10/2016      Reactions   Ace Inhibitors Cough      Medication List    STOP taking these medications   HYDROcodone-acetaminophen 5-325 MG tablet Commonly known as:  NORCO/VICODIN   naproxen 500 MG tablet Commonly known as:  NAPROSYN   traMADol 50 MG tablet Commonly known as:  ULTRAM     TAKE these medications   ALPRAZolam 0.25 MG tablet Commonly known as:  XANAX Take 0.25 mg by mouth 2 (two) times daily.   amLODipine 10 MG tablet Commonly known as:  NORVASC Take 10 mg by mouth at bedtime.   ARNICARE ARNICA Crea Apply 1 application topically 4 (four) times daily as needed (for hip pain.).   aspirin EC 81 MG tablet Take 81 mg by mouth at bedtime.   atenolol 25 MG tablet Commonly known as:  TENORMIN Take 25 mg by mouth 2 (two) times daily.   B-complex with vitamin C tablet Take 1 tablet by mouth daily.   cholecalciferol 1000 units tablet Commonly known as:  VITAMIN D Take 1,000 Units by mouth daily.   Cinnamon 500 MG Tabs Take 500 mg by mouth at bedtime.   ciprofloxacin 500 MG tablet Commonly known as:  CIPRO Take 1 tablet (500 mg total) by mouth 2 (two) times daily.   cloNIDine 0.1 MG tablet Commonly known  as:  CATAPRES Take 0.1 mg by mouth at bedtime.   CoQ10 100 MG Caps Take 100 mg by mouth daily.   DULoxetine 60 MG capsule Commonly known as:  CYMBALTA Take 60 mg by mouth at bedtime.   hydrALAZINE 25 MG tablet Commonly known as:  APRESOLINE Take 25 mg by mouth 2 (two) times daily.   multivitamin with minerals Tabs tablet Take 1 tablet by mouth daily.   omega-3 acid ethyl esters 1 g capsule Commonly known as:  LOVAZA Take 1 g by mouth daily.   oxyCODONE-acetaminophen 5-325 MG tablet Commonly known as:  ROXICET Take 1-2 tablets by mouth every 4 (four) hours as needed (Pain).   pantoprazole 40 MG tablet Commonly known as:  PROTONIX Take 40 mg by mouth See admin instructions. DAILY EXCEPT Tuesday & Thursday.   POLICOSANOL PO Take 20 mg by mouth daily. For cholesterol   primidone 50 MG tablet Commonly known as:  MYSOLINE Take 50-100 mg by mouth 2 (two) times daily. 50 mg in the morning & 100 mg at night.   simvastatin 10 MG tablet Commonly known as:  ZOCOR Take 5 mg by mouth 3 (three) times a week. Tuesday,  Thursday and Sunday evenings only.   SYSTANE 0.4-0.3 % Gel ophthalmic gel Generic drug:  Polyethyl Glycol-Propyl Glycol Place 1 application into both eyes every 8 (eight) hours as needed (dry eyes).   tamsulosin 0.4 MG Caps capsule Commonly known as:  FLOMAX Take 0.4 mg by mouth at bedtime.   tiZANidine 4 MG tablet Commonly known as:  ZANAFLEX Take 1 tablet (4 mg total) by mouth every 8 (eight) hours as needed for muscle spasms.   valsartan-hydrochlorothiazide 320-12.5 MG tablet Commonly known as:  DIOVAN-HCT Take 1 tablet by mouth daily.            Durable Medical Equipment        Start     Ordered   12/07/16 1756  DME Walker rolling  Once    Question:  Patient needs a walker to treat with the following condition  Answer:  Status post total replacement of right hip   12/07/16 1755   12/07/16 1756  DME 3 n 1  Once     03 /13/18 1755       Diagnostic Studies: Dg Pelvis Portable  Result Date: 12/07/2016 CLINICAL DATA:  Right hip replacement EXAM: PORTABLE PELVIS 1-2 VIEWS COMPARISON:  11/15/2015 FINDINGS: Patient is status post right hip replacement ; alignment within normal limits. Soft tissue gas lateral hip consistent with recent postoperative status. Pubic symphysis appears intact. IMPRESSION: Status post  right hip replacement with expected postsurgical changes. Electronically Signed   By: Donavan Foil M.D.   On: 12/07/2016 16:03   Dg C-arm 1-60 Min  Result Date: 12/07/2016 CLINICAL DATA:  Status post right hip replacement EXAM: OPERATIVE RIGHT HIP WITH PELVIS; DG C-ARM 61-120 MIN COMPARISON:  None. FLUOROSCOPY TIME:  Fluoroscopy Time:  33 seconds Radiation Exposure Index (if provided by the fluoroscopic device): Not available Number of Acquired Spot Images: 2 FINDINGS: Right hip replacement is noted.  No acute bony abnormality is seen. IMPRESSION: Status post right hip replacement Electronically Signed   By: Inez Catalina M.D.   On: 12/07/2016 17:18   Xr C-arm No Report  Result Date: 11/10/2016 Please see Notes or Procedures tab for imaging impression.  Dg Hip Operative Unilat W Or W/o Pelvis Right  Result Date: 12/07/2016 CLINICAL DATA:  Status post right hip replacement EXAM: OPERATIVE RIGHT HIP WITH PELVIS; DG C-ARM 61-120 MIN COMPARISON:  None. FLUOROSCOPY TIME:  Fluoroscopy Time:  33 seconds Radiation Exposure Index (if provided by the fluoroscopic device): Not available Number of Acquired Spot Images: 2 FINDINGS: Right hip replacement is noted.  No acute bony abnormality is seen. IMPRESSION: Status post right hip replacement Electronically Signed   By: Inez Catalina M.D.   On: 12/07/2016 17:18   Xr Ribs Unilateral Left  Result Date: 11/11/2016 Left ribs : lower left posterior rib fracture appears well healed. No other acute fractures .    Disposition: 01-Home or Self Care  Discharge Instructions    Discharge  patient    Complete by:  As directed    Discharge disposition:  01-Home or Self Care   Discharge patient date:  12/10/2016      Follow-up Information    Mcarthur Rossetti, MD. Schedule an appointment as soon as possible for a visit in 2 week(s).   Specialty:  Orthopedic Surgery Contact information: [address] [place] [address] [phone]            Signed: Mcarthur Rossetti 12/10/2016, 7:06 AM

## 2016-12-10 NOTE — Progress Notes (Signed)
Physical Therapy Treatment Patient Details Name: Rodney [name] MRN: [id number] DOB: July 11, 1946 Today's Date: 12/10/2016    History of Present Illness Admitted for R THA, Dir Ant, WBAT;  has a past medical history of Adenomatous colon polyp; Aortic calcification ([place]); Arthritis; Brachial neuritis; Carpal tunnel syndrome;  Hyperlipidemia; Hypertension;     PT Comments    Patient continues to make progress toward mobility goals. Able to negotiate step with assist to stabilize RW. Wife present for session. Current plan remains appropriate.    Follow Up Recommendations  Home health PT;Supervision/Assistance - 24 hour     Equipment Recommendations  Rolling walker with 5" wheels;3in1 (PT)    Recommendations for Other Services OT consult     Precautions / Restrictions Precautions Precautions: Fall Restrictions Weight Bearing Restrictions: Yes RLE Weight Bearing: Weight bearing as tolerated    Mobility  Bed Mobility Overal bed mobility: Needs Assistance Bed Mobility: Supine to Sit     Supine to sit: Supervision     General bed mobility comments: cues for sequencing; supervision for safety; no physical assist needed; HOB flat and no use of rails  Transfers Overall transfer level: Needs assistance Equipment used: Rolling walker (2 wheeled) Transfers: Sit to/from Stand Sit to Stand: Min guard         General transfer comment: min guard for safety; safe hand placement demonstrated  Ambulation/Gait Ambulation/Gait assistance: Supervision Ambulation Distance (Feet): 150 Feet Assistive device: Rolling walker (2 wheeled) Gait Pattern/deviations: Step-through pattern;Decreased stride length;Decreased weight shift to right;Antalgic Gait velocity: decreased   General Gait Details: cues for sequencing and monitoring activity tolerance and need for rest breaks; pt took one standing rest break due to R LE fatigue   Stairs Stairs: Yes   Stair Management: No rails;Step  to pattern;Forwards;Backwards;With walker Number of Stairs:  (1 step X2) General stair comments: cues for sequencing and technique; min A to stabilize RW; wife present  Wheelchair Mobility    Modified Rankin (Stroke Patients Only)       Balance Overall balance assessment: Needs assistance Sitting-balance support: No upper extremity supported;Feet supported Sitting balance-Leahy Scale: Good     Standing balance support: Bilateral upper extremity supported;During functional activity Standing balance-Leahy Scale: Poor                      Cognition Arousal/Alertness: Awake/alert Behavior During Therapy: WFL for tasks assessed/performed Overall Cognitive Status: Within Functional Limits for tasks assessed                      Exercises Total Joint Exercises Ankle Circles/Pumps: AROM;Both;10 reps Quad Sets: AROM;Both;10 reps Heel Slides: AAROM;Right;10 reps Hip ABduction/ADduction: AAROM;Right;Seated;10 reps (10 standing and 10 seated)    General Comments General comments (skin integrity, edema, etc.): wife present throughout most of session; reviewed HEP frequency and encouraged mobility every hour      Pertinent Vitals/Pain Pain Assessment: Faces Faces Pain Scale: Hurts little more Pain Location: R hip Pain Descriptors / Indicators: Sore;Guarding Pain Intervention(s): Monitored during session;Premedicated before session;Repositioned    Home Living                      Prior Function            PT Goals (current goals can now be found in the care plan section) Acute Rehab PT Goals Patient Stated Goal: none stated Progress towards PT goals: Progressing toward goals    Frequency    7X/week  PT Plan Current plan remains appropriate    Co-evaluation             End of Session Equipment Utilized During Treatment: Gait belt Activity Tolerance: Patient tolerated treatment well Patient left: in chair;with call bell/phone  within reach;with family/visitor present Nurse Communication: Mobility status PT Visit Diagnosis: Unsteadiness on feet (R26.81);Pain Pain - Right/Left: Right Pain - part of body: Hip     Time: 0830-0928 PT Time Calculation (min) (ACUTE ONLY): 58 min  Charges:  $Gait Training: 23-37 mins $Therapeutic Exercise: 8-22 mins $Therapeutic Activity: 8-22 mins                    G Codes:       Salina April, PTA Pager: [phone]   12/10/2016, 10:14 AM

## 2016-12-10 NOTE — Progress Notes (Signed)
Contacted [facility] DME rep for RW for home. Jonnie Finner RN CCM Case Mgmt phone [phone]

## 2016-12-10 NOTE — Progress Notes (Signed)
Pt ready for discharge. Education/instructions reviewed with pt and spouse, and all questions/concerns addressed. IV removed and belongings gathered. Pt will be transported out via wheelchair to wife's vehicle

## 2016-12-13 ENCOUNTER — Telehealth (INDEPENDENT_AMBULATORY_CARE_PROVIDER_SITE_OTHER): Payer: Self-pay | Admitting: Orthopaedic Surgery

## 2016-12-13 NOTE — Telephone Encounter (Signed)
FYI

## 2016-12-13 NOTE — Telephone Encounter (Signed)
Patient's wife Santiago Glad) called advised she received the wrong discharge papers.  The patient information she received was Rodney [name]. Santiago Glad gave the papers to the (PT) from [facility]. The (PT) name was Minna Merritts on Saturday. Santiago Glad also said she contacted HIPAA and they took a report. Santiago Glad just wanted Dr Ninfa Linden to know. The  Number to contact Santiago Glad is [phone]

## 2016-12-14 ENCOUNTER — Telehealth (INDEPENDENT_AMBULATORY_CARE_PROVIDER_SITE_OTHER): Payer: Self-pay | Admitting: Orthopaedic Surgery

## 2016-12-14 NOTE — Telephone Encounter (Signed)
Verbal order given to [facility]  

## 2016-12-14 NOTE — Telephone Encounter (Signed)
Rodney [name] with [facility] request a verbal for 1 time a week for 1 week, 3 times a week for 1 week &  1 time a week for 1 week.

## 2016-12-21 ENCOUNTER — Ambulatory Visit (INDEPENDENT_AMBULATORY_CARE_PROVIDER_SITE_OTHER): Payer: Medicare Other | Admitting: Orthopaedic Surgery

## 2016-12-21 DIAGNOSIS — Z96641 Presence of right artificial hip joint: Secondary | ICD-10-CM

## 2016-12-21 MED ORDER — OXYCODONE-ACETAMINOPHEN 5-325 MG PO TABS: 1.0000 | ORAL_TABLET | Freq: Four times a day (QID) | ORAL | 0 refills | Status: DC | PRN

## 2016-12-21 NOTE — Progress Notes (Signed)
The patient is 2 weeks status post a right total hip replacement. Exam it with a cane. He is only using that out and palate. He is doing well overall. He feels like he is just one more refill of his Percocet. He's been on just a baby aspirin once a day.  On examination incision looks good. There is a mild hematoma but no seroma at all. There is just mild swelling. The incision looks good overall. I removed the old sutures and placed knee Steri-Strips. His leg lengths are near equal and there is no

## 2017-01-18 ENCOUNTER — Ambulatory Visit (INDEPENDENT_AMBULATORY_CARE_PROVIDER_SITE_OTHER): Payer: Medicare Other | Admitting: Orthopaedic Surgery

## 2017-01-19 ENCOUNTER — Ambulatory Visit (INDEPENDENT_AMBULATORY_CARE_PROVIDER_SITE_OTHER): Payer: Medicare Other | Admitting: Orthopaedic Surgery

## 2017-01-19 DIAGNOSIS — Z96641 Presence of right artificial hip joint: Secondary | ICD-10-CM
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
## 2017-02-17 ENCOUNTER — Other Ambulatory Visit: Payer: Self-pay | Admitting: Gastroenterology

## 2017-02-22 ENCOUNTER — Ambulatory Visit (HOSPITAL_COMMUNITY)
Admission: RE | Admit: 2017-02-22 | Discharge: 2017-02-22 | Disposition: A | Discharge status: Home / Self Care | Payer: Medicare Other | Source: Ambulatory Visit | Attending: Gastroenterology | Admitting: Gastroenterology

## 2017-02-22 ENCOUNTER — Encounter (HOSPITAL_COMMUNITY): Payer: Self-pay | Admitting: Emergency Medicine

## 2017-02-22 ENCOUNTER — Encounter (HOSPITAL_COMMUNITY)
Admission: RE | Disposition: A | Discharge status: Home / Self Care | Payer: Self-pay | Source: Ambulatory Visit | Attending: Gastroenterology

## 2017-02-22 DIAGNOSIS — K921 Melena: Secondary | ICD-10-CM | POA: Insufficient documentation

## 2017-02-22 DIAGNOSIS — I10 Essential (primary) hypertension: Secondary | ICD-10-CM | POA: Insufficient documentation

## 2017-02-22 DIAGNOSIS — K648 Other hemorrhoids: Secondary | ICD-10-CM | POA: Insufficient documentation

## 2017-02-22 DIAGNOSIS — G4733 Obstructive sleep apnea (adult) (pediatric): Secondary | ICD-10-CM | POA: Diagnosis not present

## 2017-02-22 DIAGNOSIS — E7800 Pure hypercholesterolemia, unspecified: Secondary | ICD-10-CM | POA: Insufficient documentation

## 2017-02-22 HISTORY — PX: FLEXIBLE SIGMOIDOSCOPY: SHX5431

## 2017-02-22 SURGERY — SIGMOIDOSCOPY, FLEXIBLE
Anesthesia: Moderate Sedation

## 2017-02-22 NOTE — H&P (Signed)
Procedure: Diagnostic flexible proctosigmoidoscopy to evaluate painless hematochezia. 02/12/2011 normal CBC and serum iron saturation. 09/18/2015 normal surveillance colonoscopy was performed. Random colon biopsies did not show microscopic colitis. 05/16/2012 colonoscopy was performed with removal of a 7 mm tubular adenomatous sigmoid colon polyp  History: The patient is a 71 year old male born [birth year]. He has had intermittent moderate volume painless hematochezia since April 2018 unresponsive to preparation H suppositories. 02/11/2017 anoscopy showed internal hemorrhoids. White blood cell count was 5000. Hemoglobin was 15.7 g. Serum iron saturation was 38%.  The patient is scheduled to undergo diagnostic flexible proctosigmoidoscopy.  Medication allergies: ACE inhibitors cause cough  Past medical history: Umbilical hernia repair. Hypertension. Hypercholesterolemia. Obstructive sleep apnea.  Exam: The patient is alert and lying comfortably on the endoscopy stretcher. Abdomen is soft and nontender to palpation. Lungs are clear to auscultation. Cardiac exam reveals a regular rhythm.  Plan: Proceed with diagnostic flexible proctosigmoidoscopy.

## 2017-02-22 NOTE — Op Note (Signed)
[facility] Patient Name: Rodney [name] Procedure Date: 02/22/2017 MRN: [id number] Attending MD: Garlan Fair , MD Date of Birth: July 23, 1946 CSN: [id number] Age: 71 Admit Type: Outpatient Procedure:                Flexible Sigmoidoscopy Indications:              Hematochezia. 02/11/2017 hemoglobin was 15.7 g and                            serum iron saturation was 38%. 09/18/2015 normal                            surveillance colonoscopy was performed. 05/16/2012                            colonoscopy was performed with removal of a 7 mm                            tubular adenomatous sigmoid colon polyp Providers:                Garlan Fair, MD, Cleda Daub, RN, Marcene Duos, Technician Referring MD:              Medicines:                None Complications:            No immediate complications. Estimated Blood Loss:     Estimated blood loss: none. Procedure:                Pre-Anesthesia Assessment:                           - Prior to the procedure, a History and Physical                            was performed, and patient medications and                            allergies were reviewed. The patient's tolerance of                            previous anesthesia was also reviewed. The risks                            and benefits of the procedure and the sedation                            options and risks were discussed with the patient.                            All questions were answered, and informed consent  was obtained. Prior Anticoagulants: The patient has                            taken no previous anticoagulant or antiplatelet                            agents. ASA Grade Assessment: II - A patient with                            mild systemic disease. After reviewing the risks                            and benefits, the patient was deemed in                            satisfactory  condition to undergo the procedure.                           After obtaining informed consent, the scope was                            passed under direct vision. The Endoscope was                            introduced through the anus and advanced to the the                            splenic flexure. The flexible sigmoidoscopy was                            accomplished without difficulty. The patient                            tolerated the procedure well. The quality of the                            bowel preparation was good. Scope In: Scope Out: Findings:      The perianal and digital rectal examinations were normal.      The entire examined colon appeared normal. Impression:               - The entire examined colon is normal.                           - No specimens collected. Moderate Sedation:      None Recommendation:           - Patient has a contact number available for                            emergencies. The signs and symptoms of potential                            delayed complications were discussed with the  patient. Return to normal activities tomorrow.                            Written discharge instructions were provided to the                            patient.                           - Return to primary care physician PRN. Procedure Code(s):        --- Professional ---                           [phone], Sigmoidoscopy, flexible; diagnostic,                            including collection of specimen(s) by brushing or                            washing, when performed (separate procedure) Diagnosis Code(s):        --- Professional ---                           K92.1, Melena (includes Hematochezia) CPT copyright 2016 American Medical Association. All rights reserved. The codes documented in this report are preliminary and upon coder review may  be revised to meet current compliance requirements. Earle Gell, MD Garlan Fair, MD 02/22/2017 2:44:38 PM This report has been signed electronically. Number of Addenda: 0

## 2017-02-22 NOTE — Discharge Instructions (Signed)
Flexible Sigmoidoscopy, Care After  This sheet gives you information about how to care for yourself after your procedure. Your health care provider may also give you more specific instructions. If you have problems or questions, contact your health care provider.  What can I expect after the procedure?  After the procedure, it is common to have:  · Abdominal cramping or pain.  · Bloating.  · A small amount of rectal bleeding if you had a biopsy.    Follow these instructions at home:  · Take over-the-counter and prescription medicines only as told by your health care provider.  ·  Do not drive for 24 hours if you received a medicine to help you relax (sedative).  · Keep all follow-up visits as told by your health care provider. This is important.  Contact a health care provider if:  · You have abdominal pain or cramping that gets worse or is not helped with medicine.  · You continue to have small amounts of rectal bleeding after 24 hours.  · You have nausea or vomiting.  · You feel weak or dizzy.  · You have a fever.  Get help right away if:  · You pass large blood clots or see a large amount of blood in the toilet after having a bowel movement.  · You have nausea or vomiting for more than 24 hours after the procedure.  This information is not intended to replace advice given to you by your health care provider. Make sure you discuss any questions you have with your health care provider.  Document Released: 09/18/2013 Document Revised: 04/02/2016 Document Reviewed: 12/13/2015  Elsevier Interactive Patient Education © 2017 Elsevier Inc.

## 2017-02-24 ENCOUNTER — Encounter (HOSPITAL_COMMUNITY): Payer: Self-pay | Admitting: Gastroenterology

## 2017-05-19 ENCOUNTER — Encounter (HOSPITAL_COMMUNITY): Payer: Self-pay | Admitting: Orthopaedic Surgery

## 2017-05-19 NOTE — Addendum Note (Signed)
Addendum  created 05/19/17 1136 by Roberts Gaudy, MD   Sign clinical note

## 2017-07-11 ENCOUNTER — Ambulatory Visit
Admission: RE | Admit: 2017-07-11 | Discharge: 2017-07-11 | Disposition: A | Discharge status: Home / Self Care | Payer: Medicare Other | Source: Ambulatory Visit | Attending: Internal Medicine | Admitting: Internal Medicine

## 2017-07-11 ENCOUNTER — Other Ambulatory Visit: Payer: Self-pay | Admitting: Internal Medicine

## 2017-07-11 DIAGNOSIS — R0609 Other forms of dyspnea: Secondary | ICD-10-CM

## 2017-07-13 ENCOUNTER — Other Ambulatory Visit (HOSPITAL_COMMUNITY): Payer: Self-pay | Admitting: Respiratory Therapy

## 2017-07-13 DIAGNOSIS — Z87891 Personal history of nicotine dependence: Secondary | ICD-10-CM

## 2017-07-13 DIAGNOSIS — R0609 Other forms of dyspnea: Secondary | ICD-10-CM

## 2017-07-13 DIAGNOSIS — Z7722 Contact with and (suspected) exposure to environmental tobacco smoke (acute) (chronic): Secondary | ICD-10-CM

## 2017-08-09 ENCOUNTER — Ambulatory Visit (INDEPENDENT_AMBULATORY_CARE_PROVIDER_SITE_OTHER): Payer: Medicare Other | Admitting: Orthopaedic Surgery

## 2017-08-15 ENCOUNTER — Ambulatory Visit (HOSPITAL_COMMUNITY)
Admission: RE | Admit: 2017-08-15 | Discharge: 2017-08-15 | Disposition: A | Discharge status: Home / Self Care | Payer: Medicare Other | Source: Ambulatory Visit | Attending: Internal Medicine | Admitting: Internal Medicine

## 2017-08-15 DIAGNOSIS — Z7722 Contact with and (suspected) exposure to environmental tobacco smoke (acute) (chronic): Secondary | ICD-10-CM | POA: Insufficient documentation

## 2017-08-15 DIAGNOSIS — R0609 Other forms of dyspnea: Secondary | ICD-10-CM | POA: Diagnosis not present

## 2017-08-15 DIAGNOSIS — Z87891 Personal history of nicotine dependence: Secondary | ICD-10-CM | POA: Diagnosis not present

## 2017-08-15 LAB — PULMONARY FUNCTION TEST
DL/VA % pred: 94 %
DL/VA: 4.46 ml/min/mmHg/L
DLCO UNC % PRED: 73 %
DLCO unc: 25.88 ml/min/mmHg
FEF 25-75 PRE: 1.12 L/s
FEF 25-75 Post: 1.73 L/sec
FEF2575-%Change-Post: 53 %
FEF2575-%PRED-PRE: 43 %
FEF2575-%Pred-Post: 67 %
FEV1-%Change-Post: 12 %
FEV1-%PRED-POST: 75 %
FEV1-%Pred-Pre: 67 %
FEV1-POST: 2.61 L
FEV1-PRE: 2.32 L
FEV1FVC-%Change-Post: 9 %
FEV1FVC-%Pred-Pre: 87 %
FEV6-%CHANGE-POST: 6 %
FEV6-%PRED-POST: 81 %
FEV6-%PRED-PRE: 76 %
FEV6-POST: 3.61 L
FEV6-PRE: 3.4 L
FEV6FVC-%Change-Post: 3 %
FEV6FVC-%PRED-POST: 103 %
FEV6FVC-%PRED-PRE: 99 %
FVC-%CHANGE-POST: 2 %
FVC-%Pred-Post: 79 %
FVC-%Pred-Pre: 77 %
FVC-Post: 3.71 L
FVC-Pre: 3.62 L
POST FEV6/FVC RATIO: 97 %
PRE FEV1/FVC RATIO: 64 %
Post FEV1/FVC ratio: 70 %
Pre FEV6/FVC Ratio: 94 %
RV % PRED: 95 %
RV: 2.49 L
TLC % PRED: 81 %
TLC: 6.05 L

## 2017-08-15 MED ORDER — ALBUTEROL SULFATE (2.5 MG/3ML) 0.083% IN NEBU
2.5000 mg | INHALATION_SOLUTION | Freq: Once | RESPIRATORY_TRACT | Status: AC
  Administered 2017-08-15: 2.5 mg via RESPIRATORY_TRACT

## 2017-08-16 ENCOUNTER — Ambulatory Visit (INDEPENDENT_AMBULATORY_CARE_PROVIDER_SITE_OTHER): Payer: Medicare Other

## 2017-08-16 ENCOUNTER — Encounter (INDEPENDENT_AMBULATORY_CARE_PROVIDER_SITE_OTHER): Payer: Self-pay | Admitting: Orthopaedic Surgery

## 2017-08-16 ENCOUNTER — Ambulatory Visit (INDEPENDENT_AMBULATORY_CARE_PROVIDER_SITE_OTHER): Payer: Medicare Other | Admitting: Orthopaedic Surgery

## 2017-08-16 DIAGNOSIS — Z96641 Presence of right artificial hip joint: Secondary | ICD-10-CM | POA: Diagnosis not present

## 2017-08-16 NOTE — Progress Notes (Signed)
The patient is now about 8 months status post a right total hip arthroplasty through direct anterior approach to 3 will treat severe arthritis of the right hip.  He said he is doing excellent overall.  He is not using assistive device and is having no hip pain at all.  On exam I can easily move his right hip through range of motion without any difficulty at all.  An AP pelvis shows a well-seated implant with no complicating features.  His left hip appears normal.  Examination of his clavicle shows no mobility of the bone or motion of the bone grossly at all.  There is a little bit of a bump over the clavicle cosmetically but overall he is got full range of motion of his left shoulder without any difficulty at all.  At this point will follow-up as needed.  We talked at length about the things we need to bring him back for me and he has been seen for anything else to do.  All questions and concerns were answered and addressed.

## 2017-09-06 ENCOUNTER — Institutional Professional Consult (permissible substitution): Payer: Medicare Other | Admitting: Emergency Medicine

## 2017-09-14 ENCOUNTER — Ambulatory Visit: Payer: Medicare Other | Admitting: Internal Medicine

## 2017-09-14 ENCOUNTER — Other Ambulatory Visit: Payer: Medicare Other

## 2017-09-14 ENCOUNTER — Encounter: Payer: Self-pay | Admitting: Internal Medicine

## 2017-09-14 VITALS — BP 130/74 | HR 58 | Ht 72.0 in | Wt 245.6 lb

## 2017-09-14 DIAGNOSIS — R0609 Other forms of dyspnea: Secondary | ICD-10-CM

## 2017-09-14 DIAGNOSIS — Z87898 Personal history of other specified conditions: Secondary | ICD-10-CM | POA: Diagnosis not present

## 2017-09-14 DIAGNOSIS — Z8781 Personal history of (healed) traumatic fracture: Secondary | ICD-10-CM

## 2017-09-14 DIAGNOSIS — R0602 Shortness of breath: Secondary | ICD-10-CM | POA: Diagnosis not present

## 2017-09-14 LAB — NITRIC OXIDE: Nitric Oxide: 26

## 2017-09-14 NOTE — Patient Instructions (Signed)
ICD-10-CM   1. History of rib fracture Z87.81   2. History of snoring Z87.898   3. Dyspnea on exertion R06.09    Do d-dimer blood test - screening test for blood clot given your concern Do ono test on room air Do CT chest without contrast
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
## 2017-09-15 LAB — D-DIMER, QUANTITATIVE: D-Dimer, Quant: 0.34 mcg/mL FEU (ref <0.50)

## 2017-09-16 ENCOUNTER — Telehealth: Payer: Self-pay | Admitting: Internal Medicine
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
## 2017-09-23 ENCOUNTER — Ambulatory Visit (INDEPENDENT_AMBULATORY_CARE_PROVIDER_SITE_OTHER)
Admission: RE | Admit: 2017-09-23 | Discharge: 2017-09-23 | Disposition: A | Discharge status: Home / Self Care | Payer: Medicare Other | Source: Ambulatory Visit | Attending: Internal Medicine | Admitting: Internal Medicine

## 2017-09-23 DIAGNOSIS — Z8781 Personal history of (healed) traumatic fracture: Secondary | ICD-10-CM | POA: Diagnosis not present

## 2017-09-23 DIAGNOSIS — R0609 Other forms of dyspnea: Secondary | ICD-10-CM | POA: Diagnosis not present

## 2017-10-04 ENCOUNTER — Telehealth: Payer: Self-pay | Admitting: Internal Medicine

## 2017-10-04 MED ORDER — TIOTROPIUM BROMIDE MONOHYDRATE 2.5 MCG/ACT IN AERS: 2.0000 | INHALATION_SPRAY | Freq: Every day | RESPIRATORY_TRACT | 5 refills | Status: DC
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
## 2017-10-04 NOTE — Telephone Encounter (Signed)
Spoke with pt, aware of results/recs.  spiriva sent to preferred pharmacy.  Verified rov with pt.

## 2017-10-04 NOTE — Telephone Encounter (Signed)
Called and spoke with pt.  Pt states he is experiencing increased sob with exertion & dizziness at time.  Pt is requesting CT results from 09/23/17.  Pt denies additional symptoms.  MR please advise. Thanks.

## 2017-10-17 ENCOUNTER — Ambulatory Visit: Payer: Medicare Other | Admitting: Adult Health

## 2017-10-17 ENCOUNTER — Encounter: Payer: Self-pay | Admitting: Adult Health

## 2017-10-17 DIAGNOSIS — J439 Emphysema, unspecified: Secondary | ICD-10-CM | POA: Diagnosis not present

## 2017-10-17 DIAGNOSIS — J449 Chronic obstructive pulmonary disease, unspecified: Secondary | ICD-10-CM | POA: Insufficient documentation

## 2017-10-17 DIAGNOSIS — S2242XD Multiple fractures of ribs, left side, subsequent encounter for fracture with routine healing: Secondary | ICD-10-CM
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
## 2017-10-17 NOTE — Patient Instructions (Addendum)
Change Spiriva 2 puff At bedtime, rinse after use.  Work on not smoking .  Follow up with Dr. Chase Caller in 2-3 months and As needed
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
## 2018-01-23 ENCOUNTER — Ambulatory Visit: Payer: Medicare Other | Admitting: Internal Medicine

## 2018-01-23 ENCOUNTER — Encounter: Payer: Self-pay | Admitting: Internal Medicine

## 2018-01-23 VITALS — BP 130/76 | HR 63 | Ht 72.0 in | Wt 242.4 lb

## 2018-01-23 DIAGNOSIS — J439 Emphysema, unspecified: Secondary | ICD-10-CM

## 2018-01-23 DIAGNOSIS — R42 Dizziness and giddiness: Secondary | ICD-10-CM

## 2018-01-23 NOTE — Patient Instructions (Addendum)
Pulmonary emphysema, unspecified emphysema type ([place])  - stable  - continue spiriva daily - take it any one time of the day consistently - take albuterol as needed - talk to PCP Rodney Huddle, MD and get prevnar vaccine  Dizziness - unrelated to spiriva or emphysema  - likely due to balance and neuropathy issues - please follow with PCP Rodney Huddle, MD   Followup 6-9 months or sooner if needed
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
## 2018-01-25 ENCOUNTER — Telehealth: Payer: Self-pay | Admitting: Internal Medicine

## 2018-01-25 NOTE — Telephone Encounter (Signed)
Called and spoke to pt.  Pt had questions regarding AVS.  Pt states on his AVS it mentioned to continue albuterol prn.  Pt states he does not have an albuterol inhaler or neb solutions.  He was unsure of what albuterol was used for. I explained to pt that albuterol is used as needed for sob or cough. Pt states he feels that Spiriva has been effective for him, however he does developed sob with exertion at times.  MR please advise if you would like to prescribe albuterol HFA?

## 2018-01-26 MED ORDER — ALBUTEROL SULFATE HFA 108 (90 BASE) MCG/ACT IN AERS: 2.0000 | INHALATION_SPRAY | Freq: Four times a day (QID) | RESPIRATORY_TRACT | 6 refills | Status: DC | PRN

## 2018-01-26 NOTE — Telephone Encounter (Signed)
Please apologize for our lack of clarity Yes ok to send in albuterol HFA and take it as needed 2 puff but not to exceed 3 times daily

## 2018-01-26 NOTE — Telephone Encounter (Signed)
Left message for patient to call back  

## 2018-01-26 NOTE — Telephone Encounter (Signed)
Patient called back in. Advised him of MR's recs. Patient wishes to have the albuterol inhaler called into [facility] on [address]. Advised him I would go ahead and call in this for him. He verbalized understanding. Nothing else needed at time of call.

## 2018-02-09 ENCOUNTER — Other Ambulatory Visit (HOSPITAL_COMMUNITY): Payer: Self-pay | Admitting: Internal Medicine

## 2018-02-09 DIAGNOSIS — R29898 Other symptoms and signs involving the musculoskeletal system: Secondary | ICD-10-CM

## 2018-02-09 DIAGNOSIS — R42 Dizziness and giddiness: Secondary | ICD-10-CM

## 2018-02-15 ENCOUNTER — Encounter (HOSPITAL_COMMUNITY): Payer: Medicare Other

## 2018-02-22 ENCOUNTER — Telehealth (HOSPITAL_COMMUNITY): Payer: Self-pay | Admitting: *Deleted

## 2018-02-22 NOTE — Telephone Encounter (Signed)
Patient given detailed instructions per Myocardial Perfusion Study Information Sheet for the test on 02/27/18. Patient notified to arrive 15 minutes early and that it is imperative to arrive on time for appointment to keep from having the test rescheduled.  If you need to cancel or reschedule your appointment, please call the office within 24 hours of your appointment. . Patient verbalized understanding. Rodney [name]    

## 2018-02-27 ENCOUNTER — Ambulatory Visit (HOSPITAL_COMMUNITY): Payer: Medicare Other | Attending: Cardiology

## 2018-02-27 DIAGNOSIS — R9439 Abnormal result of other cardiovascular function study: Secondary | ICD-10-CM | POA: Insufficient documentation

## 2018-02-27 DIAGNOSIS — R29898 Other symptoms and signs involving the musculoskeletal system: Secondary | ICD-10-CM | POA: Diagnosis present

## 2018-02-27 DIAGNOSIS — R5383 Other fatigue: Secondary | ICD-10-CM | POA: Diagnosis not present

## 2018-02-27 DIAGNOSIS — R42 Dizziness and giddiness: Secondary | ICD-10-CM | POA: Diagnosis not present

## 2018-02-27 DIAGNOSIS — E785 Hyperlipidemia, unspecified: Secondary | ICD-10-CM | POA: Diagnosis not present

## 2018-02-27 DIAGNOSIS — I10 Essential (primary) hypertension: Secondary | ICD-10-CM | POA: Diagnosis not present

## 2018-02-27 LAB — MYOCARDIAL PERFUSION IMAGING
CHL CUP NUCLEAR SDS: 3
CHL CUP NUCLEAR SRS: 6
CHL CUP RESTING HR STRESS: 53 {beats}/min
LV dias vol: 161 mL (ref 62–150)
LVSYSVOL: 81 mL
NUC STRESS TID: 1.18
Peak HR: 64 {beats}/min
RATE: 0.32
SSS: 9

## 2018-02-27 MED ORDER — TECHNETIUM TC 99M TETROFOSMIN IV KIT
32.3000 | PACK | Freq: Once | INTRAVENOUS | Status: AC | PRN
  Administered 2018-02-27: 32.3 via INTRAVENOUS
  Filled 2018-02-27: qty 33

## 2018-02-27 MED ORDER — REGADENOSON 0.4 MG/5ML IV SOLN
0.4000 mg | Freq: Once | INTRAVENOUS | Status: AC
  Administered 2018-02-27: 0.4 mg via INTRAVENOUS

## 2018-02-27 MED ORDER — TECHNETIUM TC 99M TETROFOSMIN IV KIT
10.9000 | PACK | Freq: Once | INTRAVENOUS | Status: AC | PRN
  Administered 2018-02-27: 10.9 via INTRAVENOUS
  Filled 2018-02-27: qty 11

## 2018-04-11 ENCOUNTER — Other Ambulatory Visit: Payer: Self-pay | Admitting: Internal Medicine

## 2018-08-07 ENCOUNTER — Ambulatory Visit: Payer: Medicare Other | Admitting: Internal Medicine

## 2018-08-07 ENCOUNTER — Ambulatory Visit (INDEPENDENT_AMBULATORY_CARE_PROVIDER_SITE_OTHER): Payer: Medicare Other | Admitting: Adult Health

## 2018-08-07 ENCOUNTER — Ambulatory Visit: Payer: Medicare Other | Admitting: Nurse Practitioner

## 2018-08-07 ENCOUNTER — Encounter: Payer: Self-pay | Admitting: Adult Health

## 2018-08-07 VITALS — BP 134/78 | HR 66 | Ht 72.0 in | Wt 244.2 lb

## 2018-08-07 DIAGNOSIS — J439 Emphysema, unspecified: Secondary | ICD-10-CM | POA: Diagnosis not present

## 2018-08-07 DIAGNOSIS — Z720 Tobacco use: Secondary | ICD-10-CM

## 2018-08-07 MED ORDER — TIOTROPIUM BROMIDE MONOHYDRATE 2.5 MCG/ACT IN AERS: INHALATION_SPRAY | RESPIRATORY_TRACT | 6 refills | Status: DC
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
## 2018-08-07 NOTE — Patient Instructions (Addendum)
Continue on Spiriva 2 puff At bedtime, rinse after use.  Mucinex DM Twice daily  As needed  Cough/congestion .  Check with Insurance to see what is covered with formulary .  Work on not smoking .  Activity as tolerated.  Refer to [facility] for LDCT Chest screening program.  Call back if you are interested in Pulmonary rehab.  Follow up with Dr. Chase Caller in 6 months and As needed

## 2018-08-07 NOTE — Assessment & Plan Note (Signed)
Compensated on present regimen .   Plan  Patient Instructions  Continue on Spiriva 2 puff At bedtime, rinse after use.  Mucinex DM Twice daily  As needed  Cough/congestion .  Check with Insurance to see what is covered with formulary .  Work on not smoking .  Activity as tolerated.  Refer to [facility] for LDCT Chest screening program.  Call back if you are interested in Pulmonary rehab.  Follow up with Dr. Chase Caller in 6 months and As needed

## 2018-11-23 ENCOUNTER — Telehealth (INDEPENDENT_AMBULATORY_CARE_PROVIDER_SITE_OTHER): Payer: Self-pay

## 2018-11-23 NOTE — Telephone Encounter (Signed)
[facility] called [facility] they need documentation for patient on whether or not patient needs pre-meds for dental procedure. Please fax to [phone]

## 2018-11-23 NOTE — Telephone Encounter (Signed)
Faxed note to provided fax number.

## 2019-02-18 ENCOUNTER — Other Ambulatory Visit: Payer: Self-pay | Admitting: Internal Medicine

## 2019-03-21 ENCOUNTER — Other Ambulatory Visit: Payer: Self-pay | Admitting: Internal Medicine

## 2019-05-06 ENCOUNTER — Other Ambulatory Visit: Payer: Self-pay | Admitting: Internal Medicine

## 2019-06-02 ENCOUNTER — Other Ambulatory Visit: Payer: Self-pay | Admitting: Internal Medicine

## 2019-06-20 ENCOUNTER — Other Ambulatory Visit: Payer: Self-pay | Admitting: General Surgery

## 2019-06-20 MED ORDER — ALBUTEROL SULFATE HFA 108 (90 BASE) MCG/ACT IN AERS: INHALATION_SPRAY | RESPIRATORY_TRACT | 0 refills | Status: DC

## 2019-06-28 ENCOUNTER — Other Ambulatory Visit: Payer: Self-pay | Admitting: Internal Medicine

## 2019-07-26 ENCOUNTER — Other Ambulatory Visit: Payer: Self-pay | Admitting: Adult Health

## 2019-08-13 ENCOUNTER — Ambulatory Visit: Payer: Medicare Other | Admitting: Orthopaedic Surgery

## 2019-08-13 ENCOUNTER — Encounter: Payer: Self-pay | Admitting: Orthopaedic Surgery

## 2019-08-13 ENCOUNTER — Ambulatory Visit: Payer: Self-pay

## 2019-08-13 DIAGNOSIS — R0781 Pleurodynia: Secondary | ICD-10-CM

## 2019-08-13 MED ORDER — HYDROCODONE-ACETAMINOPHEN 5-325 MG PO TABS: 1.0000 | ORAL_TABLET | Freq: Four times a day (QID) | ORAL | 0 refills | Status: DC | PRN

## 2019-08-13 MED ORDER — CYCLOBENZAPRINE HCL 10 MG PO TABS: 10.0000 mg | ORAL_TABLET | Freq: Three times a day (TID) | ORAL | 0 refills | Status: DC | PRN
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
## 2019-08-28 ENCOUNTER — Other Ambulatory Visit: Payer: Self-pay | Admitting: Orthopaedic Surgery

## 2019-09-07 ENCOUNTER — Other Ambulatory Visit: Payer: Self-pay | Admitting: Adult Health

## 2019-10-03 ENCOUNTER — Other Ambulatory Visit: Payer: Self-pay | Admitting: Adult Health

## 2019-10-22 ENCOUNTER — Ambulatory Visit: Payer: Medicare Other | Attending: Internal Medicine

## 2019-10-22 DIAGNOSIS — Z23 Encounter for immunization: Secondary | ICD-10-CM | POA: Insufficient documentation

## 2019-10-22 NOTE — Progress Notes (Signed)
   Covid-19 Vaccination Clinic  Name:  Rodney [name]    MRN: [id number] DOB: 03/24/1946  10/22/2019  [name] was observed post Covid-19 immunization for 15 minutes without incidence. He was provided with Vaccine Information Sheet and instruction to access the V-Safe system.   Mr. Over was instructed to call 911 with any severe reactions post vaccine: [name] Difficulty breathing  . Swelling of your face and throat  . A fast heartbeat  . A bad rash all over your body  . Dizziness and weakness    Immunizations Administered    Name Date Dose VIS Date Route   Pfizer COVID-19 Vaccine 10/22/2019 12:48 PM 0.3 mL 09/07/2019 Intramuscular   Manufacturer: [place]   Lot: [id number]   [place]: [id number]

## 2019-10-30 ENCOUNTER — Other Ambulatory Visit: Payer: Self-pay | Admitting: Adult Health

## 2019-11-12 ENCOUNTER — Ambulatory Visit: Payer: Medicare Other | Attending: Internal Medicine

## 2019-11-12 DIAGNOSIS — Z23 Encounter for immunization: Secondary | ICD-10-CM | POA: Insufficient documentation

## 2019-11-12 NOTE — Progress Notes (Signed)
   Covid-19 Vaccination Clinic  Name:  Rodney [name]    MRN: [id number] DOB: [birth year]  11/12/2019  Mr. [name] was observed post Covid-19 immunization for 15 minutes without incidence. He was provided with Vaccine Information Sheet and instruction to access the V-Safe system.   Mr. [name] was instructed to call 911 with any severe reactions post vaccine: [name] Difficulty breathing  . Swelling of your face and throat  . A fast heartbeat  . A bad rash all over your body  . Dizziness and weakness    Immunizations Administered    Name Date Dose VIS Date Route   Pfizer COVID-19 Vaccine 11/12/2019 11:31 AM 0.3 mL 09/07/2019 Intramuscular   Manufacturer: [place]   Lot: [id number]   [place]: [id number]

## 2019-12-03 ENCOUNTER — Other Ambulatory Visit: Payer: Self-pay | Admitting: Adult Health

## 2019-12-04 ENCOUNTER — Telehealth: Payer: Self-pay | Admitting: Adult Health

## 2019-12-04 MED ORDER — SPIRIVA RESPIMAT 2.5 MCG/ACT IN AERS: INHALATION_SPRAY | RESPIRATORY_TRACT | 0 refills | Status: DC

## 2019-12-04 NOTE — Telephone Encounter (Signed)
LMOM for pt that 1 refill for Spiriva was sent to [facility] on [address]. Advised to keep appt with Tammy Parrett on 12/31/19 to get further refills.

## 2019-12-04 NOTE — Telephone Encounter (Signed)
Called spoke with patient's spouse to explain that patient has not been seen in the office since 07/2018 and we must see him 1 per year in order to continue refilling meds.  Spouse voiced her understanding and will have patient call the office when he returns from the dentist.  Office number provided.  Will wait for patient appt to be scheduled before sending refills.

## 2019-12-10 NOTE — Telephone Encounter (Addendum)
Patient is now scheduled for appt with TP on 4.5.21 and refill #1 was sent on 3.9.21

## 2019-12-31 ENCOUNTER — Ambulatory Visit (INDEPENDENT_AMBULATORY_CARE_PROVIDER_SITE_OTHER): Payer: Medicare Other | Admitting: Adult Health

## 2019-12-31 ENCOUNTER — Ambulatory Visit (INDEPENDENT_AMBULATORY_CARE_PROVIDER_SITE_OTHER): Payer: Medicare Other

## 2019-12-31 ENCOUNTER — Encounter: Payer: Self-pay | Admitting: Adult Health

## 2019-12-31 ENCOUNTER — Other Ambulatory Visit: Payer: Self-pay

## 2019-12-31 VITALS — BP 122/60 | HR 66 | Temp 97.6°F | Ht 72.0 in | Wt 243.8 lb

## 2019-12-31 DIAGNOSIS — J441 Chronic obstructive pulmonary disease with (acute) exacerbation: Secondary | ICD-10-CM

## 2019-12-31 DIAGNOSIS — J439 Emphysema, unspecified: Secondary | ICD-10-CM | POA: Diagnosis not present

## 2019-12-31 DIAGNOSIS — E669 Obesity, unspecified: Secondary | ICD-10-CM | POA: Diagnosis not present

## 2019-12-31 MED ORDER — STIOLTO RESPIMAT 2.5-2.5 MCG/ACT IN AERS: 2.0000 | INHALATION_SPRAY | Freq: Every day | RESPIRATORY_TRACT | 12 refills | Status: DC

## 2019-12-31 NOTE — Assessment & Plan Note (Signed)
Mild COPD with increased symptom burden.  Check chest x-ray today.  Change Spiriva to LAMA LABA Work on healthy weight loss Plan  Patient Instructions  Change Spiriva to Stiolto 2 puffs daily .  Chest xray today .  Activity as tolerated.  Healthy weight loss.  Work on not smoking cigars.  Follow up in 3 months with Dr. Chase Caller or Tallis Soledad NP and As needed   Please contact office for sooner follow up if symptoms do not improve or worsen or seek emergency care
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
## 2019-12-31 NOTE — Patient Instructions (Addendum)
Change Spiriva to Stiolto 2 puffs daily .  Chest xray today .  Activity as tolerated.  Healthy weight loss.  Work on not smoking cigars.  Follow up in 3 months with Dr. Chase Caller or Rochell Puett NP and As needed   Please contact office for sooner follow up if symptoms do not improve or worsen or seek emergency care

## 2019-12-31 NOTE — Assessment & Plan Note (Signed)
Work on healthy weight loss 

## 2020-01-06 ENCOUNTER — Other Ambulatory Visit: Payer: Self-pay | Admitting: Adult Health

## 2020-04-13 ENCOUNTER — Other Ambulatory Visit: Payer: Self-pay | Admitting: Internal Medicine

## 2020-04-21 ENCOUNTER — Other Ambulatory Visit: Payer: Self-pay | Admitting: Internal Medicine

## 2020-04-21 ENCOUNTER — Ambulatory Visit
Admission: RE | Admit: 2020-04-21 | Discharge: 2020-04-21 | Disposition: A | Discharge status: Home / Self Care | Payer: Medicare Other | Source: Ambulatory Visit | Attending: Internal Medicine | Admitting: Internal Medicine

## 2020-04-21 DIAGNOSIS — R5383 Other fatigue: Secondary | ICD-10-CM

## 2020-06-23 ENCOUNTER — Ambulatory Visit: Payer: Medicare Other | Admitting: Internal Medicine

## 2020-07-09 ENCOUNTER — Other Ambulatory Visit: Payer: Self-pay

## 2020-07-09 ENCOUNTER — Ambulatory Visit: Payer: Medicare Other | Admitting: Internal Medicine

## 2020-07-09 ENCOUNTER — Encounter: Payer: Self-pay | Admitting: Internal Medicine

## 2020-07-09 VITALS — BP 118/78 | HR 71 | Ht 72.0 in | Wt 237.6 lb

## 2020-07-09 DIAGNOSIS — J439 Emphysema, unspecified: Secondary | ICD-10-CM

## 2020-07-09 DIAGNOSIS — I519 Heart disease, unspecified: Secondary | ICD-10-CM | POA: Diagnosis not present

## 2020-07-09 DIAGNOSIS — R9389 Abnormal findings on diagnostic imaging of other specified body structures: Secondary | ICD-10-CM

## 2020-07-09 DIAGNOSIS — Z8781 Personal history of (healed) traumatic fracture: Secondary | ICD-10-CM

## 2020-07-09 DIAGNOSIS — Z720 Tobacco use: Secondary | ICD-10-CM | POA: Diagnosis not present

## 2020-07-09 NOTE — Addendum Note (Signed)
Addended by: Lorretta Harp on: 07/09/2020 12:09 PM   Modules accepted: Orders

## 2020-07-09 NOTE — Addendum Note (Signed)
Addended by: Suzzanne Cloud E on: 07/09/2020 12:12 PM   Modules accepted: Orders
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
## 2020-07-09 NOTE — Patient Instructions (Addendum)
ICD-10-CM   1. Pulmonary emphysema, unspecified emphysema type ([place])  J43.9   2. Tobacco abuse  Z72.0   3. Systolic dysfunction, left ventricle  I51.9   4. Abnormal CT of the chest  R93.89   5. History of rib fracture  Z87.81    Overall clinical situation is stable.  However we need to be restaged  Plan -Do blood work for alpha-1 antitrypsin = Get the Covid booster next week and after that we can reassess you need for other vaccines -Work on quitting smoking -Continue Stiolto daily with albuterol as needed -In the next 3 months do echocardiogram -Do HRCT scan of the chest without contrast in the next 3 months   Follow-up  -In 3 months but after completing HR Emily CT scan and echocardiogram

## 2020-07-18 LAB — ALPHA-1 ANTITRYPSIN PHENOTYPE: A-1 Antitrypsin, Ser: 164 mg/dL (ref 83–199)

## 2020-07-22 NOTE — Progress Notes (Signed)
Alpha 1 is MM and normal genetic for copd . Will not be calling with result

## 2020-09-23 ENCOUNTER — Telehealth: Payer: Self-pay | Admitting: Internal Medicine

## 2020-09-24 NOTE — Telephone Encounter (Signed)
Lmtcb for pt.  

## 2020-09-29 NOTE — Telephone Encounter (Signed)
atc pt X3, line rang to fast busy signal.  Wcb.  

## 2020-09-30 NOTE — Telephone Encounter (Signed)
Lmtcb for pt. Will close encounter as we have made several attempts to reach pt with no return call.

## 2020-10-08 ENCOUNTER — Other Ambulatory Visit: Payer: Self-pay

## 2020-10-08 ENCOUNTER — Ambulatory Visit (HOSPITAL_COMMUNITY): Payer: Medicare Other | Attending: Internal Medicine

## 2020-10-08 ENCOUNTER — Ambulatory Visit (INDEPENDENT_AMBULATORY_CARE_PROVIDER_SITE_OTHER)
Admission: RE | Admit: 2020-10-08 | Discharge: 2020-10-08 | Disposition: A | Discharge status: Home / Self Care | Payer: Medicare Other | Source: Ambulatory Visit | Attending: Internal Medicine | Admitting: Internal Medicine

## 2020-10-08 ENCOUNTER — Other Ambulatory Visit: Payer: Medicare Other

## 2020-10-08 DIAGNOSIS — E669 Obesity, unspecified: Secondary | ICD-10-CM | POA: Diagnosis not present

## 2020-10-08 DIAGNOSIS — E785 Hyperlipidemia, unspecified: Secondary | ICD-10-CM | POA: Diagnosis not present

## 2020-10-08 DIAGNOSIS — R0602 Shortness of breath: Secondary | ICD-10-CM

## 2020-10-08 DIAGNOSIS — J439 Emphysema, unspecified: Secondary | ICD-10-CM | POA: Insufficient documentation

## 2020-10-08 DIAGNOSIS — F17200 Nicotine dependence, unspecified, uncomplicated: Secondary | ICD-10-CM | POA: Insufficient documentation

## 2020-10-08 DIAGNOSIS — I119 Hypertensive heart disease without heart failure: Secondary | ICD-10-CM | POA: Diagnosis not present

## 2020-10-08 LAB — ECHOCARDIOGRAM COMPLETE
AR max vel: 1.62 cm2
AV Area VTI: 1.67 cm2
AV Area mean vel: 1.69 cm2
AV Mean grad: 12 mmHg
AV Peak grad: 24.2 mmHg
Ao pk vel: 2.46 m/s
Area-P 1/2: 2.09 cm2
P 1/2 time: 529 msec
S' Lateral: 4.3 cm

## 2020-10-13 NOTE — Progress Notes (Signed)
HRCT says he has ILD . Last seen oct 2021. Lst PFT 2018  Plan  - get another full PFT  - have him do blood work ANA, RF, CCP,  - give him ILD questions when he comes  - give him fist avail 30 min slot  Thanks  MR

## 2020-10-13 NOTE — Progress Notes (Signed)
Mild low normal heart pump and still heart muscle. Similar to 2019 stress test. If no chest pain. No need to see cardiology

## 2020-10-15 ENCOUNTER — Other Ambulatory Visit: Payer: Self-pay | Admitting: *Deleted

## 2020-10-15 DIAGNOSIS — R9389 Abnormal findings on diagnostic imaging of other specified body structures: Secondary | ICD-10-CM

## 2020-10-15 NOTE — Progress Notes (Signed)
Info from pt's CT: Plan  - get another full PFT  - have him do blood work ANA, RF, CCP,  - give him ILD questions when he comes  - give him fist avail 30 min slot

## 2020-10-31 DIAGNOSIS — L409 Psoriasis, unspecified: Secondary | ICD-10-CM | POA: Diagnosis not present

## 2020-10-31 DIAGNOSIS — L820 Inflamed seborrheic keratosis: Secondary | ICD-10-CM | POA: Diagnosis not present

## 2020-10-31 DIAGNOSIS — L570 Actinic keratosis: Secondary | ICD-10-CM | POA: Diagnosis not present

## 2020-10-31 DIAGNOSIS — D485 Neoplasm of uncertain behavior of skin: Secondary | ICD-10-CM | POA: Diagnosis not present

## 2020-11-05 DIAGNOSIS — L308 Other specified dermatitis: Secondary | ICD-10-CM | POA: Diagnosis not present

## 2020-11-22 ENCOUNTER — Other Ambulatory Visit (HOSPITAL_COMMUNITY): Admission: RE | Admit: 2020-11-22 | Payer: Medicare Other | Source: Ambulatory Visit

## 2020-11-24 DIAGNOSIS — G459 Transient cerebral ischemic attack, unspecified: Secondary | ICD-10-CM | POA: Diagnosis not present

## 2020-11-24 DIAGNOSIS — E7800 Pure hypercholesterolemia, unspecified: Secondary | ICD-10-CM | POA: Diagnosis not present

## 2020-11-24 DIAGNOSIS — J439 Emphysema, unspecified: Secondary | ICD-10-CM | POA: Diagnosis not present

## 2020-11-24 DIAGNOSIS — E782 Mixed hyperlipidemia: Secondary | ICD-10-CM | POA: Diagnosis not present

## 2020-11-24 DIAGNOSIS — K219 Gastro-esophageal reflux disease without esophagitis: Secondary | ICD-10-CM | POA: Diagnosis not present

## 2020-11-24 DIAGNOSIS — I10 Essential (primary) hypertension: Secondary | ICD-10-CM | POA: Diagnosis not present

## 2020-11-24 DIAGNOSIS — M1611 Unilateral primary osteoarthritis, right hip: Secondary | ICD-10-CM | POA: Diagnosis not present

## 2020-11-25 ENCOUNTER — Ambulatory Visit: Payer: Medicare Other | Admitting: Internal Medicine

## 2020-12-12 DIAGNOSIS — Z0001 Encounter for general adult medical examination with abnormal findings: Secondary | ICD-10-CM | POA: Diagnosis not present

## 2020-12-12 DIAGNOSIS — M792 Neuralgia and neuritis, unspecified: Secondary | ICD-10-CM | POA: Diagnosis not present

## 2020-12-12 DIAGNOSIS — J439 Emphysema, unspecified: Secondary | ICD-10-CM | POA: Diagnosis not present

## 2020-12-12 DIAGNOSIS — E7800 Pure hypercholesterolemia, unspecified: Secondary | ICD-10-CM | POA: Diagnosis not present

## 2020-12-12 DIAGNOSIS — G459 Transient cerebral ischemic attack, unspecified: Secondary | ICD-10-CM | POA: Diagnosis not present

## 2020-12-12 DIAGNOSIS — Z79899 Other long term (current) drug therapy: Secondary | ICD-10-CM | POA: Diagnosis not present

## 2020-12-12 DIAGNOSIS — I10 Essential (primary) hypertension: Secondary | ICD-10-CM | POA: Diagnosis not present

## 2020-12-23 DIAGNOSIS — I10 Essential (primary) hypertension: Secondary | ICD-10-CM | POA: Diagnosis not present

## 2020-12-23 DIAGNOSIS — E785 Hyperlipidemia, unspecified: Secondary | ICD-10-CM | POA: Diagnosis not present

## 2020-12-23 DIAGNOSIS — J439 Emphysema, unspecified: Secondary | ICD-10-CM | POA: Diagnosis not present

## 2020-12-23 DIAGNOSIS — E782 Mixed hyperlipidemia: Secondary | ICD-10-CM | POA: Diagnosis not present

## 2020-12-23 DIAGNOSIS — E7800 Pure hypercholesterolemia, unspecified: Secondary | ICD-10-CM | POA: Diagnosis not present

## 2020-12-23 DIAGNOSIS — K219 Gastro-esophageal reflux disease without esophagitis: Secondary | ICD-10-CM | POA: Diagnosis not present

## 2020-12-23 DIAGNOSIS — M1611 Unilateral primary osteoarthritis, right hip: Secondary | ICD-10-CM | POA: Diagnosis not present

## 2020-12-23 DIAGNOSIS — G459 Transient cerebral ischemic attack, unspecified: Secondary | ICD-10-CM | POA: Diagnosis not present

## 2020-12-26 ENCOUNTER — Other Ambulatory Visit (HOSPITAL_COMMUNITY)
Admission: RE | Admit: 2020-12-26 | Discharge: 2020-12-26 | Disposition: A | Discharge status: Home / Self Care | Payer: Medicare Other | Source: Ambulatory Visit | Attending: Internal Medicine | Admitting: Internal Medicine

## 2020-12-26 DIAGNOSIS — Z20822 Contact with and (suspected) exposure to covid-19: Secondary | ICD-10-CM | POA: Diagnosis not present

## 2020-12-26 DIAGNOSIS — Z01812 Encounter for preprocedural laboratory examination: Secondary | ICD-10-CM | POA: Insufficient documentation

## 2020-12-27 LAB — SARS CORONAVIRUS 2 (TAT 6-24 HRS): SARS Coronavirus 2: NEGATIVE

## 2020-12-29 ENCOUNTER — Ambulatory Visit (INDEPENDENT_AMBULATORY_CARE_PROVIDER_SITE_OTHER): Payer: Medicare Other | Admitting: Internal Medicine

## 2020-12-29 ENCOUNTER — Other Ambulatory Visit: Payer: Self-pay

## 2020-12-29 ENCOUNTER — Encounter: Payer: Self-pay | Admitting: Internal Medicine

## 2020-12-29 ENCOUNTER — Ambulatory Visit: Payer: Medicare Other | Admitting: Internal Medicine

## 2020-12-29 VITALS — BP 130/70 | HR 58 | Ht 72.0 in | Wt 236.0 lb

## 2020-12-29 DIAGNOSIS — J849 Interstitial pulmonary disease, unspecified: Secondary | ICD-10-CM | POA: Diagnosis not present

## 2020-12-29 DIAGNOSIS — E669 Obesity, unspecified: Secondary | ICD-10-CM

## 2020-12-29 DIAGNOSIS — R0609 Other forms of dyspnea: Secondary | ICD-10-CM

## 2020-12-29 DIAGNOSIS — R0600 Dyspnea, unspecified: Secondary | ICD-10-CM | POA: Diagnosis not present

## 2020-12-29 DIAGNOSIS — Z720 Tobacco use: Secondary | ICD-10-CM | POA: Diagnosis not present

## 2020-12-29 DIAGNOSIS — R9389 Abnormal findings on diagnostic imaging of other specified body structures: Secondary | ICD-10-CM | POA: Diagnosis not present

## 2020-12-29 DIAGNOSIS — J439 Emphysema, unspecified: Secondary | ICD-10-CM | POA: Diagnosis not present

## 2020-12-29 DIAGNOSIS — R053 Chronic cough: Secondary | ICD-10-CM

## 2020-12-29 LAB — PULMONARY FUNCTION TEST
DL/VA % pred: 101 %
DL/VA: 3.98 ml/min/mmHg/L
DLCO cor % pred: 84 %
DLCO cor: 22.47 ml/min/mmHg
DLCO unc % pred: 84 %
DLCO unc: 22.47 ml/min/mmHg
FEF 25-75 Post: 2.09 L/sec
FEF 25-75 Pre: 1.38 L/sec
FEF2575-%Change-Post: 51 %
FEF2575-%Pred-Post: 86 %
FEF2575-%Pred-Pre: 56 %
FEV1-%Change-Post: 9 %
FEV1-%Pred-Post: 75 %
FEV1-%Pred-Pre: 69 %
FEV1-Post: 2.51 L
FEV1-Pre: 2.3 L
FEV1FVC-%Change-Post: 0 %
FEV1FVC-%Pred-Pre: 98 %
FEV6-%Change-Post: 7 %
FEV6-%Pred-Post: 80 %
FEV6-%Pred-Pre: 74 %
FEV6-Post: 3.46 L
FEV6-Pre: 3.23 L
FEV6FVC-%Pred-Post: 106 %
FEV6FVC-%Pred-Pre: 106 %
FVC-%Change-Post: 8 %
FVC-%Pred-Post: 76 %
FVC-%Pred-Pre: 70 %
FVC-Post: 3.52 L
FVC-Pre: 3.23 L
Post FEV1/FVC ratio: 72 %
Post FEV6/FVC ratio: 100 %
Pre FEV1/FVC ratio: 71 %
Pre FEV6/FVC Ratio: 100 %
RV % pred: 85 %
RV: 2.27 L
TLC % pred: 83 %
TLC: 6.22 L

## 2020-12-29 MED ORDER — BREZTRI AEROSPHERE 160-9-4.8 MCG/ACT IN AERO: 2.0000 | INHALATION_SPRAY | Freq: Two times a day (BID) | RESPIRATORY_TRACT | 0 refills | Status: DC
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
## 2020-12-29 NOTE — Progress Notes (Signed)
Full PFT performed today. °

## 2020-12-29 NOTE — Patient Instructions (Signed)
Full Pulmonary function test performed today. 

## 2020-12-29 NOTE — Patient Instructions (Addendum)
ICD-10-CM   1. Pulmonary emphysema, unspecified emphysema type ([place])  J43.9   2. ILD (interstitial lung disease) ([place])  J84.9   3. Tobacco abuse  Z72.0   4. Obesity (BMI 30.0-34.9)  E66.9   5. Dyspnea on exertion  R06.00   6. Chronic cough  R05.3    Clinically is stable in the last few to several years based on breathing test, CT scan and echocardiogram  You do have mild interstitial lung disease that the radiologist described but it is stable since 2018  -This means you have scar tissue of the lungs and there are many different varieties to it  Residual symptoms of shortness of breath and cough are related to weight, ongoing smoking, mild interstitial lung disease and emphysema  I understand that he want further improvement in his symptoms because it affects her quality of life  Plan -Continue weight loss program -Change Stiolto to BREZTRI 2 puffs 2 times daily -take 8-week sample -Take interstitial lung disease questionnaire with you and bring it back at follow-up after filling it out -Do blood work for interstitial lung disease namely ANA, ESR, QuantiFERON gold, rheumatoid factor, CCP, SCL-70, SSA and SSB -you can do this anytime in the next few weeks   Follow-up -Return in 6-8 weeks to see Dr. Chase Caller in a 30-minute visit to discuss ILD symptom questionnaire and response to the new inhaler and blood test results

## 2021-01-05 ENCOUNTER — Other Ambulatory Visit (INDEPENDENT_AMBULATORY_CARE_PROVIDER_SITE_OTHER): Payer: Medicare Other

## 2021-01-05 DIAGNOSIS — I10 Essential (primary) hypertension: Secondary | ICD-10-CM | POA: Diagnosis not present

## 2021-01-05 DIAGNOSIS — L570 Actinic keratosis: Secondary | ICD-10-CM | POA: Diagnosis not present

## 2021-01-05 DIAGNOSIS — R9389 Abnormal findings on diagnostic imaging of other specified body structures: Secondary | ICD-10-CM

## 2021-01-05 DIAGNOSIS — G459 Transient cerebral ischemic attack, unspecified: Secondary | ICD-10-CM | POA: Diagnosis not present

## 2021-01-05 DIAGNOSIS — J439 Emphysema, unspecified: Secondary | ICD-10-CM

## 2021-01-05 DIAGNOSIS — C44622 Squamous cell carcinoma of skin of right upper limb, including shoulder: Secondary | ICD-10-CM | POA: Diagnosis not present

## 2021-01-05 DIAGNOSIS — E782 Mixed hyperlipidemia: Secondary | ICD-10-CM | POA: Diagnosis not present

## 2021-01-05 DIAGNOSIS — C44629 Squamous cell carcinoma of skin of left upper limb, including shoulder: Secondary | ICD-10-CM | POA: Diagnosis not present

## 2021-01-05 DIAGNOSIS — K219 Gastro-esophageal reflux disease without esophagitis: Secondary | ICD-10-CM | POA: Diagnosis not present

## 2021-01-05 DIAGNOSIS — D485 Neoplasm of uncertain behavior of skin: Secondary | ICD-10-CM | POA: Diagnosis not present

## 2021-01-05 LAB — SEDIMENTATION RATE: Sed Rate: 50 mm/hr — ABNORMAL HIGH (ref 0–20)

## 2021-01-07 LAB — QUANTIFERON-TB GOLD PLUS
Mitogen-NIL: >10 IU/mL
NIL: 0.04 IU/mL
QuantiFERON-TB Gold Plus: NEGATIVE
TB1-NIL: 0 IU/mL
TB2-NIL: 0 IU/mL

## 2021-01-07 LAB — ANTI-SCLERODERMA ANTIBODY: Scleroderma (Scl-70) (ENA) Antibody, IgG: <1 AI

## 2021-01-07 LAB — SJOGREN'S SYNDROME ANTIBODS(SSA + SSB)
SSA (Ro) (ENA) Antibody, IgG: <1 AI
SSB (La) (ENA) Antibody, IgG: <1 AI

## 2021-01-07 LAB — ANA: Anti Nuclear Antibody (ANA): NEGATIVE

## 2021-01-07 LAB — RHEUMATOID FACTOR: Rheumatoid fact SerPl-aCnc: <14 IU/mL (ref <14)

## 2021-01-07 LAB — CYCLIC CITRUL PEPTIDE ANTIBODY, IGG: Cyclic Citrullin Peptide Ab: <16 UNITS

## 2021-02-04 DIAGNOSIS — D690 Allergic purpura: Secondary | ICD-10-CM | POA: Diagnosis not present

## 2021-02-04 DIAGNOSIS — D485 Neoplasm of uncertain behavior of skin: Secondary | ICD-10-CM | POA: Diagnosis not present

## 2021-02-04 DIAGNOSIS — C44629 Squamous cell carcinoma of skin of left upper limb, including shoulder: Secondary | ICD-10-CM | POA: Diagnosis not present

## 2021-02-04 DIAGNOSIS — D692 Other nonthrombocytopenic purpura: Secondary | ICD-10-CM | POA: Diagnosis not present

## 2021-02-05 ENCOUNTER — Other Ambulatory Visit: Payer: Self-pay

## 2021-02-05 ENCOUNTER — Encounter: Payer: Self-pay | Admitting: Internal Medicine

## 2021-02-05 ENCOUNTER — Ambulatory Visit: Payer: Medicare Other | Admitting: Internal Medicine

## 2021-02-05 VITALS — BP 140/72 | HR 65 | Temp 97.4°F | Ht 72.0 in | Wt 232.4 lb

## 2021-02-05 DIAGNOSIS — Z720 Tobacco use: Secondary | ICD-10-CM

## 2021-02-05 DIAGNOSIS — J439 Emphysema, unspecified: Secondary | ICD-10-CM

## 2021-02-05 DIAGNOSIS — J849 Interstitial pulmonary disease, unspecified: Secondary | ICD-10-CM | POA: Diagnosis not present
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
## 2021-02-09 ENCOUNTER — Other Ambulatory Visit: Payer: Self-pay | Admitting: Internal Medicine

## 2021-02-16 DIAGNOSIS — J439 Emphysema, unspecified: Secondary | ICD-10-CM | POA: Diagnosis not present

## 2021-02-16 DIAGNOSIS — I10 Essential (primary) hypertension: Secondary | ICD-10-CM | POA: Diagnosis not present

## 2021-02-16 DIAGNOSIS — E7800 Pure hypercholesterolemia, unspecified: Secondary | ICD-10-CM | POA: Diagnosis not present

## 2021-02-16 DIAGNOSIS — E785 Hyperlipidemia, unspecified: Secondary | ICD-10-CM | POA: Diagnosis not present

## 2021-02-16 DIAGNOSIS — M1611 Unilateral primary osteoarthritis, right hip: Secondary | ICD-10-CM | POA: Diagnosis not present

## 2021-02-16 DIAGNOSIS — K219 Gastro-esophageal reflux disease without esophagitis: Secondary | ICD-10-CM | POA: Diagnosis not present

## 2021-02-16 DIAGNOSIS — E782 Mixed hyperlipidemia: Secondary | ICD-10-CM | POA: Diagnosis not present

## 2021-02-16 DIAGNOSIS — G459 Transient cerebral ischemic attack, unspecified: Secondary | ICD-10-CM | POA: Diagnosis not present

## 2021-02-24 ENCOUNTER — Telehealth: Payer: Self-pay | Admitting: Internal Medicine

## 2021-02-24 DIAGNOSIS — C44622 Squamous cell carcinoma of skin of right upper limb, including shoulder: Secondary | ICD-10-CM | POA: Diagnosis not present

## 2021-02-24 DIAGNOSIS — L905 Scar conditions and fibrosis of skin: Secondary | ICD-10-CM | POA: Diagnosis not present

## 2021-02-24 DIAGNOSIS — D485 Neoplasm of uncertain behavior of skin: Secondary | ICD-10-CM | POA: Diagnosis not present

## 2021-02-24 MED ORDER — BREZTRI AEROSPHERE 160-9-4.8 MCG/ACT IN AERO: 2.0000 | INHALATION_SPRAY | Freq: Two times a day (BID) | RESPIRATORY_TRACT | 4 refills | Status: DC

## 2021-02-24 NOTE — Telephone Encounter (Signed)
Spoke with pt and advised that Breztri refill has been sent to pharmacy. Pt verbalized understanding. Nothing further needed.

## 2021-03-06 DIAGNOSIS — D0462 Carcinoma in situ of skin of left upper limb, including shoulder: Secondary | ICD-10-CM | POA: Diagnosis not present

## 2021-03-06 DIAGNOSIS — D690 Allergic purpura: Secondary | ICD-10-CM | POA: Diagnosis not present

## 2021-03-06 DIAGNOSIS — D692 Other nonthrombocytopenic purpura: Secondary | ICD-10-CM | POA: Diagnosis not present

## 2021-03-11 DIAGNOSIS — H2513 Age-related nuclear cataract, bilateral: Secondary | ICD-10-CM | POA: Diagnosis not present

## 2021-03-23 DIAGNOSIS — D0462 Carcinoma in situ of skin of left upper limb, including shoulder: Secondary | ICD-10-CM | POA: Diagnosis not present

## 2021-06-23 DIAGNOSIS — J439 Emphysema, unspecified: Secondary | ICD-10-CM | POA: Diagnosis not present

## 2021-06-23 DIAGNOSIS — R7309 Other abnormal glucose: Secondary | ICD-10-CM | POA: Diagnosis not present

## 2021-06-23 DIAGNOSIS — Z23 Encounter for immunization: Secondary | ICD-10-CM | POA: Diagnosis not present

## 2021-06-23 DIAGNOSIS — E782 Mixed hyperlipidemia: Secondary | ICD-10-CM | POA: Diagnosis not present

## 2021-06-23 DIAGNOSIS — Z720 Tobacco use: Secondary | ICD-10-CM | POA: Diagnosis not present

## 2021-06-23 DIAGNOSIS — M5451 Vertebrogenic low back pain: Secondary | ICD-10-CM | POA: Diagnosis not present

## 2021-06-23 DIAGNOSIS — M792 Neuralgia and neuritis, unspecified: Secondary | ICD-10-CM | POA: Diagnosis not present

## 2021-06-23 DIAGNOSIS — D7589 Other specified diseases of blood and blood-forming organs: Secondary | ICD-10-CM | POA: Diagnosis not present

## 2021-07-01 DIAGNOSIS — I10 Essential (primary) hypertension: Secondary | ICD-10-CM | POA: Diagnosis not present

## 2021-07-01 DIAGNOSIS — G459 Transient cerebral ischemic attack, unspecified: Secondary | ICD-10-CM | POA: Diagnosis not present

## 2021-07-01 DIAGNOSIS — J439 Emphysema, unspecified: Secondary | ICD-10-CM | POA: Diagnosis not present

## 2021-07-01 DIAGNOSIS — M1611 Unilateral primary osteoarthritis, right hip: Secondary | ICD-10-CM | POA: Diagnosis not present

## 2021-07-01 DIAGNOSIS — E782 Mixed hyperlipidemia: Secondary | ICD-10-CM | POA: Diagnosis not present

## 2021-07-01 DIAGNOSIS — K219 Gastro-esophageal reflux disease without esophagitis: Secondary | ICD-10-CM | POA: Diagnosis not present

## 2021-09-04 ENCOUNTER — Encounter: Payer: Self-pay | Admitting: Internal Medicine

## 2021-09-04 ENCOUNTER — Ambulatory Visit: Payer: Medicare Other | Admitting: Internal Medicine

## 2021-09-04 ENCOUNTER — Other Ambulatory Visit: Payer: Self-pay

## 2021-09-04 VITALS — BP 126/72 | HR 76 | Temp 97.7°F | Ht 72.0 in | Wt 232.2 lb

## 2021-09-04 DIAGNOSIS — J439 Emphysema, unspecified: Secondary | ICD-10-CM | POA: Diagnosis not present

## 2021-09-04 DIAGNOSIS — J849 Interstitial pulmonary disease, unspecified: Secondary | ICD-10-CM | POA: Diagnosis not present

## 2021-09-04 NOTE — Patient Instructions (Addendum)
ICD-10-CM   1. Pulmonary emphysema, unspecified emphysema type ([place])  J43.9   2. ILD (interstitial lung disease) ([place])  J84.9   3. Tobacco abuse  Z72.0   4. Obesity (BMI 30.0-34.9)  E66.9   5. Dyspnea on exertion  R06.00   6. Chronic cough  R05.3    Class 2-3 shortness of breath but stable symptoms score  - due to copd, mild fibrosis, weight, stiff heart muscle  Plan  - get into exercise program + lose weight  - continue breztri as before with albuterol as needed - do HRCT supine and prone in 4-6 months  - do spiro/dlco in 4-6 months  Follow-up -Return in 4 to 6 months  to see Dr. Chase Caller in a 30-minute visit but after PFT and CT
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
## 2021-10-19 ENCOUNTER — Other Ambulatory Visit: Payer: Self-pay | Admitting: Internal Medicine

## 2021-11-02 DIAGNOSIS — L820 Inflamed seborrheic keratosis: Secondary | ICD-10-CM | POA: Diagnosis not present

## 2021-11-02 DIAGNOSIS — D485 Neoplasm of uncertain behavior of skin: Secondary | ICD-10-CM | POA: Diagnosis not present

## 2021-11-02 DIAGNOSIS — C44629 Squamous cell carcinoma of skin of left upper limb, including shoulder: Secondary | ICD-10-CM | POA: Diagnosis not present

## 2021-11-02 DIAGNOSIS — C441222 Squamous cell carcinoma of skin of right lower eyelid, including canthus: Secondary | ICD-10-CM | POA: Diagnosis not present

## 2021-11-02 DIAGNOSIS — C4441 Basal cell carcinoma of skin of scalp and neck: Secondary | ICD-10-CM | POA: Diagnosis not present

## 2021-11-02 DIAGNOSIS — C44622 Squamous cell carcinoma of skin of right upper limb, including shoulder: Secondary | ICD-10-CM | POA: Diagnosis not present

## 2021-11-02 DIAGNOSIS — Z85828 Personal history of other malignant neoplasm of skin: Secondary | ICD-10-CM | POA: Diagnosis not present

## 2021-11-02 DIAGNOSIS — R229 Localized swelling, mass and lump, unspecified: Secondary | ICD-10-CM | POA: Diagnosis not present

## 2021-11-02 DIAGNOSIS — D692 Other nonthrombocytopenic purpura: Secondary | ICD-10-CM | POA: Diagnosis not present

## 2021-11-02 DIAGNOSIS — Z08 Encounter for follow-up examination after completed treatment for malignant neoplasm: Secondary | ICD-10-CM | POA: Diagnosis not present

## 2021-11-02 DIAGNOSIS — L814 Other melanin hyperpigmentation: Secondary | ICD-10-CM | POA: Diagnosis not present

## 2021-11-02 DIAGNOSIS — C44619 Basal cell carcinoma of skin of left upper limb, including shoulder: Secondary | ICD-10-CM | POA: Diagnosis not present

## 2021-12-15 ENCOUNTER — Telehealth: Payer: Self-pay | Admitting: Internal Medicine

## 2021-12-22 DIAGNOSIS — J439 Emphysema, unspecified: Secondary | ICD-10-CM | POA: Diagnosis not present

## 2021-12-22 DIAGNOSIS — Z0001 Encounter for general adult medical examination with abnormal findings: Secondary | ICD-10-CM | POA: Diagnosis not present

## 2021-12-22 DIAGNOSIS — M792 Neuralgia and neuritis, unspecified: Secondary | ICD-10-CM | POA: Diagnosis not present

## 2021-12-22 DIAGNOSIS — G459 Transient cerebral ischemic attack, unspecified: Secondary | ICD-10-CM | POA: Diagnosis not present

## 2021-12-22 DIAGNOSIS — I700 Atherosclerosis of aorta: Secondary | ICD-10-CM | POA: Diagnosis not present

## 2021-12-22 DIAGNOSIS — D7589 Other specified diseases of blood and blood-forming organs: Secondary | ICD-10-CM | POA: Diagnosis not present

## 2021-12-22 DIAGNOSIS — I10 Essential (primary) hypertension: Secondary | ICD-10-CM | POA: Diagnosis not present

## 2021-12-22 DIAGNOSIS — R7303 Prediabetes: Secondary | ICD-10-CM | POA: Diagnosis not present

## 2021-12-22 DIAGNOSIS — R251 Tremor, unspecified: Secondary | ICD-10-CM | POA: Diagnosis not present

## 2021-12-22 DIAGNOSIS — E782 Mixed hyperlipidemia: Secondary | ICD-10-CM | POA: Diagnosis not present

## 2021-12-28 NOTE — Telephone Encounter (Signed)
Pt is scheduled at [place] 11:00 arrive at 10:45 ?

## 2021-12-31 DIAGNOSIS — H02535 Eyelid retraction left lower eyelid: Secondary | ICD-10-CM | POA: Diagnosis not present

## 2021-12-31 DIAGNOSIS — Z961 Presence of intraocular lens: Secondary | ICD-10-CM | POA: Diagnosis not present

## 2021-12-31 DIAGNOSIS — H02532 Eyelid retraction right lower eyelid: Secondary | ICD-10-CM | POA: Diagnosis not present

## 2021-12-31 DIAGNOSIS — C441222 Squamous cell carcinoma of skin of right lower eyelid, including canthus: Secondary | ICD-10-CM | POA: Diagnosis not present

## 2022-01-05 DIAGNOSIS — D04112 Carcinoma in situ of skin of right lower eyelid, including canthus: Secondary | ICD-10-CM | POA: Diagnosis not present

## 2022-01-06 ENCOUNTER — Other Ambulatory Visit: Payer: Self-pay

## 2022-01-06 DIAGNOSIS — S01111A Laceration without foreign body of right eyelid and periocular area, initial encounter: Secondary | ICD-10-CM | POA: Diagnosis not present

## 2022-01-06 DIAGNOSIS — Z483 Aftercare following surgery for neoplasm: Secondary | ICD-10-CM | POA: Diagnosis not present

## 2022-01-06 DIAGNOSIS — Z961 Presence of intraocular lens: Secondary | ICD-10-CM | POA: Diagnosis not present

## 2022-01-06 DIAGNOSIS — S01112S Laceration without foreign body of left eyelid and periocular area, sequela: Secondary | ICD-10-CM | POA: Diagnosis not present

## 2022-01-06 DIAGNOSIS — C441222 Squamous cell carcinoma of skin of right lower eyelid, including canthus: Secondary | ICD-10-CM | POA: Diagnosis not present

## 2022-01-06 DIAGNOSIS — Z85828 Personal history of other malignant neoplasm of skin: Secondary | ICD-10-CM | POA: Diagnosis not present

## 2022-01-06 DIAGNOSIS — S0181XS Laceration without foreign body of other part of head, sequela: Secondary | ICD-10-CM | POA: Diagnosis not present

## 2022-01-06 DIAGNOSIS — H10401 Unspecified chronic conjunctivitis, right eye: Secondary | ICD-10-CM | POA: Diagnosis not present

## 2022-01-06 DIAGNOSIS — S0180XA Unspecified open wound of other part of head, initial encounter: Secondary | ICD-10-CM | POA: Diagnosis not present

## 2022-01-06 DIAGNOSIS — Z481 Encounter for planned postprocedural wound closure: Secondary | ICD-10-CM | POA: Diagnosis not present

## 2022-01-06 DIAGNOSIS — H02535 Eyelid retraction left lower eyelid: Secondary | ICD-10-CM | POA: Diagnosis not present

## 2022-01-06 DIAGNOSIS — S01401A Unspecified open wound of right cheek and temporomandibular area, initial encounter: Secondary | ICD-10-CM | POA: Diagnosis not present

## 2022-01-06 DIAGNOSIS — S01111S Laceration without foreign body of right eyelid and periocular area, sequela: Secondary | ICD-10-CM | POA: Diagnosis not present

## 2022-01-06 DIAGNOSIS — H02532 Eyelid retraction right lower eyelid: Secondary | ICD-10-CM | POA: Diagnosis not present

## 2022-01-11 ENCOUNTER — Ambulatory Visit: Payer: Medicare Other | Admitting: Internal Medicine

## 2022-01-22 DIAGNOSIS — L905 Scar conditions and fibrosis of skin: Secondary | ICD-10-CM | POA: Diagnosis not present

## 2022-01-22 DIAGNOSIS — C44619 Basal cell carcinoma of skin of left upper limb, including shoulder: Secondary | ICD-10-CM | POA: Diagnosis not present

## 2022-01-25 ENCOUNTER — Ambulatory Visit (INDEPENDENT_AMBULATORY_CARE_PROVIDER_SITE_OTHER)
Admission: RE | Admit: 2022-01-25 | Discharge: 2022-01-25 | Disposition: A | Discharge status: Home / Self Care | Payer: Medicare Other | Source: Ambulatory Visit | Attending: Internal Medicine | Admitting: Internal Medicine

## 2022-01-25 DIAGNOSIS — J479 Bronchiectasis, uncomplicated: Secondary | ICD-10-CM | POA: Diagnosis not present

## 2022-01-25 DIAGNOSIS — J849 Interstitial pulmonary disease, unspecified: Secondary | ICD-10-CM | POA: Diagnosis not present

## 2022-01-25 DIAGNOSIS — J84112 Idiopathic pulmonary fibrosis: Secondary | ICD-10-CM | POA: Diagnosis not present

## 2022-01-25 DIAGNOSIS — J439 Emphysema, unspecified: Secondary | ICD-10-CM

## 2022-01-25 DIAGNOSIS — K449 Diaphragmatic hernia without obstruction or gangrene: Secondary | ICD-10-CM | POA: Diagnosis not present

## 2022-01-25 DIAGNOSIS — I2510 Atherosclerotic heart disease of native coronary artery without angina pectoris: Secondary | ICD-10-CM | POA: Diagnosis not present

## 2022-01-26 ENCOUNTER — Ambulatory Visit: Payer: Medicare Other | Admitting: Interventional Cardiology

## 2022-01-29 ENCOUNTER — Ambulatory Visit (INDEPENDENT_AMBULATORY_CARE_PROVIDER_SITE_OTHER): Payer: Medicare Other | Admitting: Internal Medicine

## 2022-01-29 ENCOUNTER — Encounter: Payer: Self-pay | Admitting: Internal Medicine

## 2022-01-29 ENCOUNTER — Ambulatory Visit: Payer: Medicare Other | Admitting: Internal Medicine

## 2022-01-29 VITALS — BP 126/62 | HR 65 | Temp 97.9°F | Ht 71.0 in | Wt 232.6 lb

## 2022-01-29 DIAGNOSIS — J439 Emphysema, unspecified: Secondary | ICD-10-CM | POA: Diagnosis not present

## 2022-01-29 DIAGNOSIS — R0989 Other specified symptoms and signs involving the circulatory and respiratory systems: Secondary | ICD-10-CM | POA: Diagnosis not present

## 2022-01-29 DIAGNOSIS — J849 Interstitial pulmonary disease, unspecified: Secondary | ICD-10-CM | POA: Diagnosis not present

## 2022-01-29 DIAGNOSIS — S40021A Contusion of right upper arm, initial encounter: Secondary | ICD-10-CM

## 2022-01-29 DIAGNOSIS — R0982 Postnasal drip: Secondary | ICD-10-CM

## 2022-01-29 LAB — PULMONARY FUNCTION TEST
DL/VA % pred: 104 %
DL/VA: 4.09 ml/min/mmHg/L
DLCO cor % pred: 78 %
DLCO cor: 20.73 ml/min/mmHg
DLCO unc % pred: 78 %
DLCO unc: 20.73 ml/min/mmHg
FEF 25-75 Pre: 1.62 L/sec
FEF2575-%Pred-Pre: 68 %
FEV1-%Pred-Pre: 72 %
FEV1-Pre: 2.4 L
FEV1FVC-%Pred-Pre: 100 %
FEV6-%Pred-Pre: 76 %
FEV6-Pre: 3.28 L
FEV6FVC-%Pred-Pre: 105 %
FVC-%Pred-Pre: 72 %
FVC-Pre: 3.29 L
Pre FEV1/FVC ratio: 73 %
Pre FEV6/FVC Ratio: 100 %
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
## 2022-01-29 NOTE — Progress Notes (Signed)
Performed Spirometry and DLCO. ?

## 2022-01-29 NOTE — Patient Instructions (Addendum)
Pulmonary emphysema, unspecified emphysema type ([place]) ?ILD (interstitial lung disease) ([place]) ?Chest congestion ?Post-nasal drip ? ? ? ?Class 2-3 shortness of breath but stable on PFT, CT and symptoms score ? - due to copd, mild fibrosis, weight, stiff heart muscle ? ? ?Plan ? - do low carb and 1800-1900 calorie diet and lose weighg ? - continue breztri as before with albuterol as needed ?- talk to PCP Josetta Huddle, MD and stop atenolol -(Beta blocker in copd) ?- stop fishoil ?- start saline nasal spray daily at night ?-  take generic fluticasone inhaler 2 squirts each nostril daily in morning ? ? ?Forearm bruising ? ?Plan ? - protect the skin ? -  ? ?Follow-up ?-Return in 6 months  to see Dr. Chase Caller  ? - symptom score and walk test during followuog ?

## 2022-01-29 NOTE — Patient Instructions (Signed)
Performed Spirometry and DLCO. ?

## 2022-02-05 DIAGNOSIS — C4441 Basal cell carcinoma of skin of scalp and neck: Secondary | ICD-10-CM | POA: Diagnosis not present

## 2022-02-25 ENCOUNTER — Encounter: Payer: Self-pay | Admitting: Interventional Cardiology

## 2022-02-25 ENCOUNTER — Ambulatory Visit: Payer: Medicare Other | Admitting: Interventional Cardiology

## 2022-02-25 VITALS — BP 110/60 | HR 59 | Ht 71.0 in | Wt 236.2 lb

## 2022-02-25 DIAGNOSIS — I10 Essential (primary) hypertension: Secondary | ICD-10-CM

## 2022-02-25 DIAGNOSIS — I2510 Atherosclerotic heart disease of native coronary artery without angina pectoris: Secondary | ICD-10-CM | POA: Diagnosis not present

## 2022-02-25 DIAGNOSIS — R0602 Shortness of breath: Secondary | ICD-10-CM

## 2022-02-25 DIAGNOSIS — I700 Atherosclerosis of aorta: Secondary | ICD-10-CM | POA: Diagnosis not present

## 2022-02-25 DIAGNOSIS — E782 Mixed hyperlipidemia: Secondary | ICD-10-CM

## 2022-02-25 DIAGNOSIS — I2584 Coronary atherosclerosis due to calcified coronary lesion: Secondary | ICD-10-CM
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
## 2022-02-25 NOTE — Patient Instructions (Signed)
Medication Instructions:  Your physician recommends that you continue on your current medications as directed. Please refer to the Current Medication list given to you today.  *If you need a refill on your cardiac medications before your next appointment, please call your pharmacy*   Lab Work: Lab work to be done today--BMP If you have labs (blood work) drawn today and your tests are completely normal, you will receive your results only by: [facility] (if you have MyChart) OR A paper copy in the mail If you have any lab test that is abnormal or we need to change your treatment, we will call you to review the results.   Testing/Procedures: Your physician has requested that you have cardiac CT. Cardiac computed tomography (CT) is a painless test that uses an x-ray machine to take clear, detailed pictures of your heart. For further information please visit [email or web address]. Please follow instruction sheet as given.     Follow-Up: At [facility], you and your health needs are our priority.  As part of our continuing mission to provide you with exceptional heart care, we have created designated Provider Care Teams.  These Care Teams include your primary Cardiologist (physician) and Advanced Practice Providers (APPs -  Physician Assistants and Nurse Practitioners) who all work together to provide you with the care you need, when you need it.  We recommend signing up for the patient portal called "MyChart".  Sign up information is provided on this After Visit Summary.  MyChart is used to connect with patients for Virtual Visits (Telemedicine).  Patients are able to view lab/test results, encounter notes, upcoming appointments, etc.  Non-urgent messages can be sent to your provider as well.   To learn more about what you can do with MyChart, go to [email or web address].    Your next appointment:   12 month(s)  The format for your next appointment:   In Person  Provider:   Dr  Irish Lack  If primary card or EP is not listed click here to update    :1}    Other Instructions   Your cardiac CT will be scheduled at one of the below locations:   [facility] [address] [place], [place] [address] [phone]  [facility] [address] [place], [place] [address] [phone]  If scheduled at [facility], please arrive at the [facility] and [place] (Entrance C2) of [facility] 30 minutes prior to test start time. You can use the FREE valet parking offered at entrance C (encouraged to control the heart rate for the test)  Proceed to the [facility] Radiology Department (first floor) to check-in and test prep.  All radiology patients and guests should use entrance C2 at [facility], accessed from [facility], even though the hospital's physical address listed is [address].    If scheduled at [facility], please arrive 15 mins early for check-in and test prep.  Please follow these instructions carefully (unless otherwise directed):  Hold all erectile dysfunction medications at least 3 days (72 hrs) prior to test.  On the Night Before the Test: Be sure to Drink plenty of water. Do not consume any caffeinated/decaffeinated beverages or chocolate 12 hours prior to your test. Do not take any antihistamines 12 hours prior to your test.   On the Day of the Test: Drink plenty of water until 1 hour prior to the test. Do not eat any  food 4 hours prior to the test. You may take your regular medications prior to the test.  Take morning dose of atenolol 50 mg two hours prior to test. HOLD Furosemide/Hydrochlorothiazide morning of the test.         After the Test: Drink plenty of water. After receiving IV contrast, you may experience a mild flushed feeling. This is normal. On occasion, you may experience a mild  rash up to 24 hours after the test. This is not dangerous. If this occurs, you can take Benadryl 25 mg and increase your fluid intake. If you experience trouble breathing, this can be serious. If it is severe call 911 IMMEDIATELY. If it is mild, please call our office. If you take any of these medications: Glipizide/Metformin, Avandament, Glucavance, please do not take 48 hours after completing test unless otherwise instructed.  We will call to schedule your test 2-4 weeks out understanding that some insurance companies will need an authorization prior to the service being performed.   For non-scheduling related questions, please contact the cardiac imaging nurse navigator should you have any questions/concerns: Marchia Bond, Cardiac Imaging Nurse Navigator Gordy Clement, Cardiac Imaging Nurse Navigator [facility] Direct Office Dial: [phone]   For scheduling needs, including cancellations and rescheduling, please call [place], [phone].   Important Information About Sugar

## 2022-02-26 LAB — BASIC METABOLIC PANEL
BUN/Creatinine Ratio: 23 (ref 10–24)
BUN: 19 mg/dL (ref 8–27)
CO2: 23 mmol/L (ref 20–29)
Calcium: 9.6 mg/dL (ref 8.6–10.2)
Chloride: 94 mmol/L — ABNORMAL LOW (ref 96–106)
Creatinine, Ser: 0.84 mg/dL (ref 0.76–1.27)
Glucose: 113 mg/dL — ABNORMAL HIGH (ref 70–99)
Potassium: 4.7 mmol/L (ref 3.5–5.2)
Sodium: 135 mmol/L (ref 134–144)
eGFR: 90 mL/min/{1.73_m2} (ref >59)

## 2022-03-12 DIAGNOSIS — H33312 Horseshoe tear of retina without detachment, left eye: Secondary | ICD-10-CM | POA: Diagnosis not present

## 2022-03-12 DIAGNOSIS — H527 Unspecified disorder of refraction: Secondary | ICD-10-CM | POA: Diagnosis not present

## 2022-03-12 DIAGNOSIS — H43812 Vitreous degeneration, left eye: Secondary | ICD-10-CM | POA: Diagnosis not present

## 2022-04-02 ENCOUNTER — Telehealth (HOSPITAL_COMMUNITY): Payer: Self-pay | Admitting: *Deleted

## 2022-04-02 NOTE — Telephone Encounter (Signed)
Attempted to call patient regarding upcoming cardiac CT appointment. °Left message on voicemail with name and callback number ° °Sokhna Christoph RN Navigator Cardiac Imaging °[facility] °[phone] Office °[phone] Cell ° °

## 2022-04-05 ENCOUNTER — Other Ambulatory Visit: Payer: Self-pay | Admitting: Cardiology

## 2022-04-05 ENCOUNTER — Ambulatory Visit (HOSPITAL_COMMUNITY)
Admission: RE | Admit: 2022-04-05 | Discharge: 2022-04-05 | Disposition: A | Discharge status: Home / Self Care | Payer: Medicare Other | Source: Ambulatory Visit | Attending: Interventional Cardiology | Admitting: Interventional Cardiology

## 2022-04-05 DIAGNOSIS — R931 Abnormal findings on diagnostic imaging of heart and coronary circulation: Secondary | ICD-10-CM | POA: Insufficient documentation

## 2022-04-05 DIAGNOSIS — R0602 Shortness of breath: Secondary | ICD-10-CM | POA: Diagnosis not present

## 2022-04-05 DIAGNOSIS — I700 Atherosclerosis of aorta: Secondary | ICD-10-CM | POA: Diagnosis not present

## 2022-04-05 DIAGNOSIS — I2510 Atherosclerotic heart disease of native coronary artery without angina pectoris: Secondary | ICD-10-CM | POA: Insufficient documentation

## 2022-04-05 DIAGNOSIS — E782 Mixed hyperlipidemia: Secondary | ICD-10-CM | POA: Diagnosis not present

## 2022-04-05 DIAGNOSIS — I2584 Coronary atherosclerosis due to calcified coronary lesion: Secondary | ICD-10-CM | POA: Diagnosis not present

## 2022-04-05 MED ORDER — IOHEXOL 350 MG/ML SOLN
100.0000 mL | Freq: Once | INTRAVENOUS | Status: AC | PRN
  Administered 2022-04-05: 100 mL via INTRAVENOUS

## 2022-04-05 MED ORDER — NITROGLYCERIN 0.4 MG SL SUBL
SUBLINGUAL_TABLET | SUBLINGUAL | Status: AC
  Filled 2022-04-05: qty 2

## 2022-04-05 MED ORDER — NITROGLYCERIN 0.4 MG SL SUBL
0.8000 mg | SUBLINGUAL_TABLET | Freq: Once | SUBLINGUAL | Status: AC
  Administered 2022-04-05: 0.8 mg via SUBLINGUAL

## 2022-04-06 ENCOUNTER — Ambulatory Visit (HOSPITAL_COMMUNITY)
Admission: RE | Admit: 2022-04-06 | Discharge: 2022-04-06 | Disposition: A | Discharge status: Home / Self Care | Payer: Medicare Other | Source: Ambulatory Visit | Attending: Cardiology | Admitting: Cardiology

## 2022-04-06 DIAGNOSIS — I2510 Atherosclerotic heart disease of native coronary artery without angina pectoris: Secondary | ICD-10-CM | POA: Diagnosis not present

## 2022-04-06 DIAGNOSIS — E782 Mixed hyperlipidemia: Secondary | ICD-10-CM | POA: Diagnosis not present

## 2022-04-06 DIAGNOSIS — R0602 Shortness of breath: Secondary | ICD-10-CM | POA: Diagnosis not present

## 2022-04-06 DIAGNOSIS — R931 Abnormal findings on diagnostic imaging of heart and coronary circulation: Secondary | ICD-10-CM | POA: Diagnosis not present

## 2022-04-06 DIAGNOSIS — I700 Atherosclerosis of aorta: Secondary | ICD-10-CM | POA: Diagnosis not present

## 2022-04-06 DIAGNOSIS — I2584 Coronary atherosclerosis due to calcified coronary lesion: Secondary | ICD-10-CM | POA: Diagnosis not present

## 2022-04-07 ENCOUNTER — Telehealth: Payer: Self-pay | Admitting: Internal Medicine

## 2022-04-07 ENCOUNTER — Telehealth: Payer: Self-pay | Admitting: Cardiology

## 2022-04-07 DIAGNOSIS — C441222 Squamous cell carcinoma of skin of right lower eyelid, including canthus: Secondary | ICD-10-CM | POA: Diagnosis not present

## 2022-04-07 DIAGNOSIS — L905 Scar conditions and fibrosis of skin: Secondary | ICD-10-CM | POA: Diagnosis not present

## 2022-04-07 DIAGNOSIS — H02532 Eyelid retraction right lower eyelid: Secondary | ICD-10-CM | POA: Diagnosis not present

## 2022-04-07 DIAGNOSIS — H10403 Unspecified chronic conjunctivitis, bilateral: Secondary | ICD-10-CM | POA: Diagnosis not present

## 2022-04-07 DIAGNOSIS — H02535 Eyelid retraction left lower eyelid: Secondary | ICD-10-CM | POA: Diagnosis not present

## 2022-04-07 NOTE — Telephone Encounter (Signed)
Called patient and he is wanting to know the results of his CT scan that was done and his pulmonary function tests.   Please advise sir

## 2022-04-07 NOTE — Telephone Encounter (Signed)
[facility] Radiology is calling regarding the patient's CT heart and calcium read by Dr. Radford Pax. She states they are needing her to sign off on her report so the Radiologist is able to access it. It has been locked since 07/10. Please advise.

## 2022-04-07 NOTE — Telephone Encounter (Signed)
[facility] Radiology and made them aware that Dr. Radford Pax has signed the study.

## 2022-04-09 ENCOUNTER — Telehealth: Payer: Self-pay | Admitting: *Deleted

## 2022-04-09 DIAGNOSIS — E782 Mixed hyperlipidemia: Secondary | ICD-10-CM

## 2022-04-09 NOTE — Telephone Encounter (Signed)
-----   Message from Leeroy Bock, [place] sent at 04/08/2022 11:05 AM EDT ----- LDL 118 in [facility] from 11/2021. Happy to discuss PCSK9i with pt in clinic if he'd like, not sure if he's already in the donut hole from his branded inhaler. If so, the cost of PCSK9i would be ~$130/month. If this is the case or if cost is prohibitive for branded medications, could also increase pravastatin to '80mg'$  daily and add ezetimibe as a back up option.

## 2022-04-09 NOTE — Telephone Encounter (Signed)
Called and spoke with patient who is asking about his PFT and his CT scan that were done in May. He states that he does not remember getting the results. He would like to know those results and what the next steps are.

## 2022-04-09 NOTE — Telephone Encounter (Signed)
Patient notified.  He would prefer to stay on oral medications at this time.  Will forward to Dr Irish Lack to see if OK to start Pravastatin and Ezetimibe.

## 2022-04-09 NOTE — Telephone Encounter (Signed)
Jettie Booze, MD  04/08/2022 10:44 AM EDT     Elevated calcium score without severe obstructive disease; negative CT FFR.  Need to be more aggressive with lipid lowering therapy, but did not tolerate rosuvastatin.  Will refer to lipid clinic to see if he is a candidate for better lipid lowering therapy

## 2022-04-12 NOTE — Telephone Encounter (Signed)
OK to increase pravastatin to 80 mg daily and add zetia 10 mg daily.   JV   I placed call to patient but received message that call could not be completed at this time.

## 2022-04-16 MED ORDER — EZETIMIBE 10 MG PO TABS
10.0000 mg | ORAL_TABLET | Freq: Every day | ORAL | 3 refills | Status: DC
Start: 2022-04-16 — End: 2023-04-01

## 2022-04-16 MED ORDER — PRAVASTATIN SODIUM 80 MG PO TABS: 80.0000 mg | ORAL_TABLET | Freq: Every evening | ORAL | 3 refills | Status: DC

## 2022-04-16 NOTE — Telephone Encounter (Signed)
Patient notified.  Prescriptions sent to [facility] on [address].  He will come in for fasting lab work on July 14, 2022.

## 2022-04-19 NOTE — Telephone Encounter (Signed)
Called and spoke to patient about results of PFT and CT scan. Nothing further needed

## 2022-04-19 NOTE — Telephone Encounter (Signed)
Please referr to my note from May 2023 where I gave the results  The bottom line -   Class 2-3 shortness of breath but stable on PFT, CT and symptoms score             - due to copd, mild fibrosis, weight, stiff heart muscle  - copd is moderate gold stage 2 at 70%

## 2022-04-20 ENCOUNTER — Telehealth: Payer: Self-pay | Admitting: Interventional Cardiology

## 2022-04-20 NOTE — Telephone Encounter (Signed)
*  STAT* If patient is at the pharmacy, call can be transferred to refill team.   1. Which medications need to be refilled? (please list name of each medication and dose if known) pravastatin (PRAVACHOL) 80 MG tablet  2. Which pharmacy/location (including street and city if local pharmacy) is medication to be sent to? [id number] [place] [place] [address]. Do they need a 30 day or 90 day supply? 988 Pt states the pharmacy did not give him the correct dosage increase. Take 1 tablet (80 mg total) by mouth every evening.

## 2022-04-20 NOTE — Telephone Encounter (Signed)
Pt's medication was already sent to pt's pharmacy confirmation received.

## 2022-05-18 ENCOUNTER — Other Ambulatory Visit: Payer: Self-pay | Admitting: Oral Surgery

## 2022-05-18 DIAGNOSIS — K1329 Other disturbances of oral epithelium, including tongue: Secondary | ICD-10-CM | POA: Diagnosis not present

## 2022-05-18 DIAGNOSIS — K148 Other diseases of tongue: Secondary | ICD-10-CM | POA: Diagnosis not present

## 2022-06-02 DIAGNOSIS — L570 Actinic keratosis: Secondary | ICD-10-CM | POA: Diagnosis not present

## 2022-06-02 DIAGNOSIS — C4441 Basal cell carcinoma of skin of scalp and neck: Secondary | ICD-10-CM | POA: Diagnosis not present

## 2022-06-02 DIAGNOSIS — D485 Neoplasm of uncertain behavior of skin: Secondary | ICD-10-CM | POA: Diagnosis not present

## 2022-06-02 DIAGNOSIS — Z08 Encounter for follow-up examination after completed treatment for malignant neoplasm: Secondary | ICD-10-CM | POA: Diagnosis not present

## 2022-06-02 DIAGNOSIS — Z85828 Personal history of other malignant neoplasm of skin: Secondary | ICD-10-CM | POA: Diagnosis not present

## 2022-06-02 DIAGNOSIS — Z86007 Personal history of in-situ neoplasm of skin: Secondary | ICD-10-CM | POA: Diagnosis not present

## 2022-06-02 DIAGNOSIS — C44619 Basal cell carcinoma of skin of left upper limb, including shoulder: Secondary | ICD-10-CM | POA: Diagnosis not present

## 2022-06-02 DIAGNOSIS — L578 Other skin changes due to chronic exposure to nonionizing radiation: Secondary | ICD-10-CM | POA: Diagnosis not present

## 2022-06-02 DIAGNOSIS — D04112 Carcinoma in situ of skin of right lower eyelid, including canthus: Secondary | ICD-10-CM | POA: Diagnosis not present

## 2022-06-02 DIAGNOSIS — R229 Localized swelling, mass and lump, unspecified: Secondary | ICD-10-CM | POA: Diagnosis not present

## 2022-06-02 DIAGNOSIS — C44629 Squamous cell carcinoma of skin of left upper limb, including shoulder: Secondary | ICD-10-CM | POA: Diagnosis not present

## 2022-06-22 DIAGNOSIS — R7309 Other abnormal glucose: Secondary | ICD-10-CM | POA: Diagnosis not present

## 2022-06-22 DIAGNOSIS — K219 Gastro-esophageal reflux disease without esophagitis: Secondary | ICD-10-CM | POA: Diagnosis not present

## 2022-06-22 DIAGNOSIS — Z23 Encounter for immunization: Secondary | ICD-10-CM | POA: Diagnosis not present

## 2022-06-22 DIAGNOSIS — R251 Tremor, unspecified: Secondary | ICD-10-CM | POA: Diagnosis not present

## 2022-06-22 DIAGNOSIS — I700 Atherosclerosis of aorta: Secondary | ICD-10-CM | POA: Diagnosis not present

## 2022-06-22 DIAGNOSIS — J439 Emphysema, unspecified: Secondary | ICD-10-CM | POA: Diagnosis not present

## 2022-06-22 DIAGNOSIS — E782 Mixed hyperlipidemia: Secondary | ICD-10-CM | POA: Diagnosis not present

## 2022-06-22 DIAGNOSIS — G459 Transient cerebral ischemic attack, unspecified: Secondary | ICD-10-CM | POA: Diagnosis not present

## 2022-06-22 DIAGNOSIS — I10 Essential (primary) hypertension: Secondary | ICD-10-CM | POA: Diagnosis not present

## 2022-06-22 DIAGNOSIS — M792 Neuralgia and neuritis, unspecified: Secondary | ICD-10-CM | POA: Diagnosis not present

## 2022-06-29 IMAGING — CT CT CHEST HIGH RESOLUTION
2 of 8 series · 14 of 36 positions shown, 17 images · non-contrast
Comparison: Chest CT dated October 08, 2020

CLINICAL DATA: Interstitial lung disease



[Series 5: high resolution · axial · 0.80mm/px · z∈[-388,-88]mm · 11 of 363 slices shown, 14 images]
[im 31/363  mediastinal]
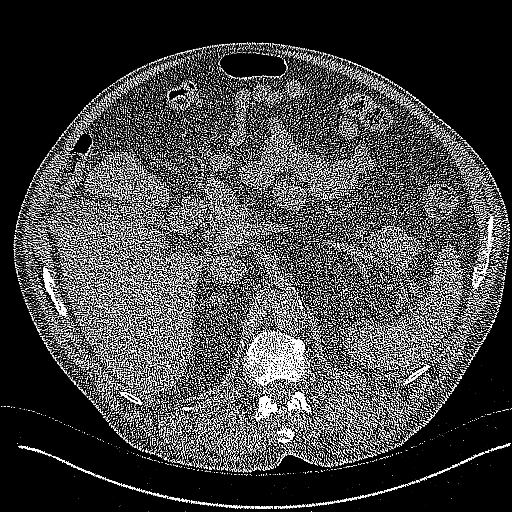
[im 31/363  lung]
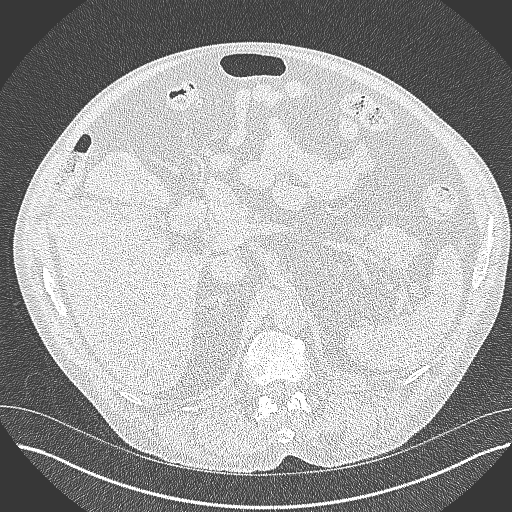
[im 61/363  lung]
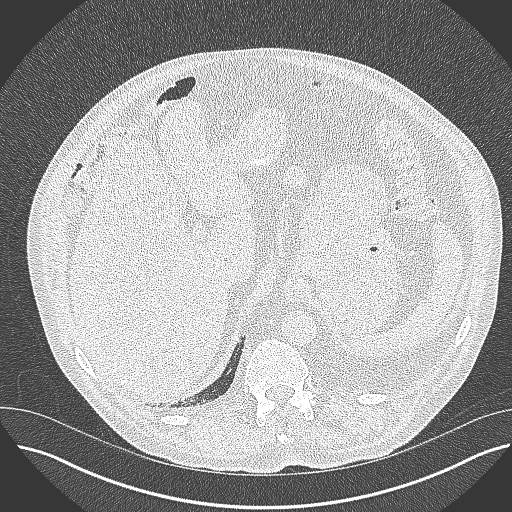
[im 91/363  lung]
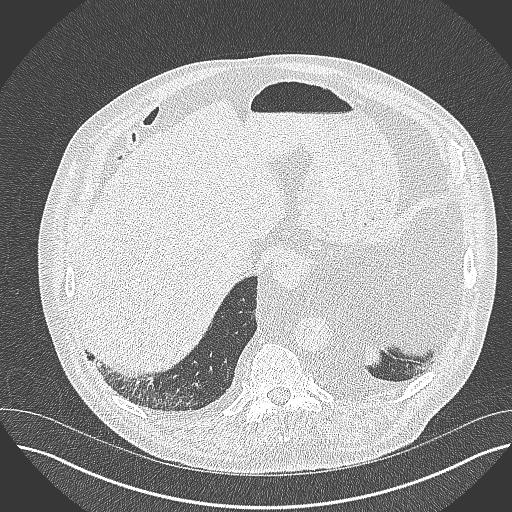
[im 121/363  lung]
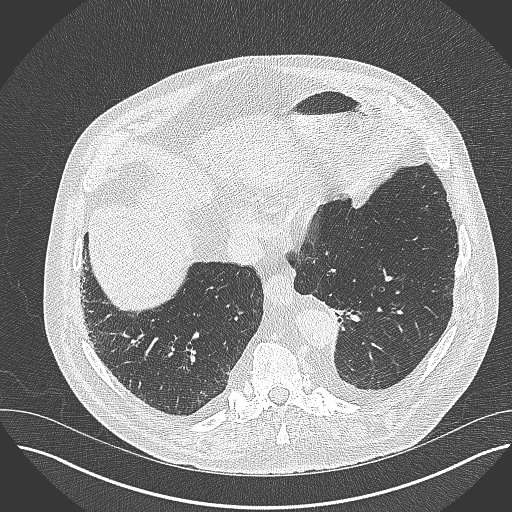
[im 151/363  mediastinal]
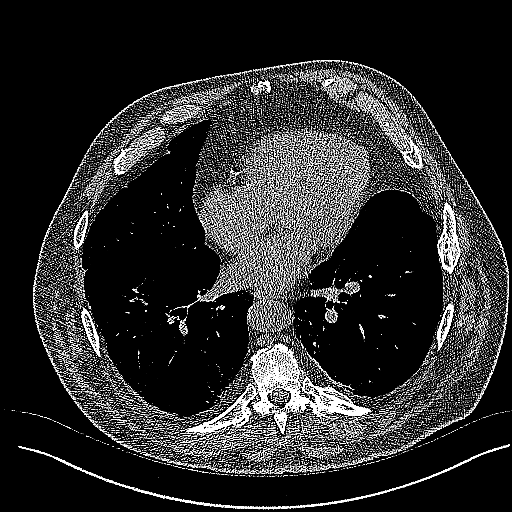
[im 151/363  lung]
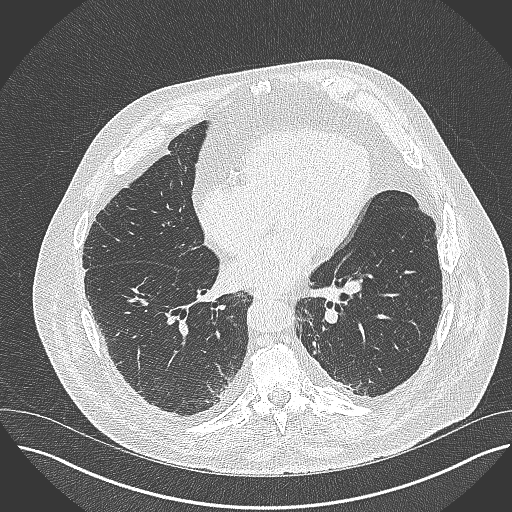
[im 182/363  lung]
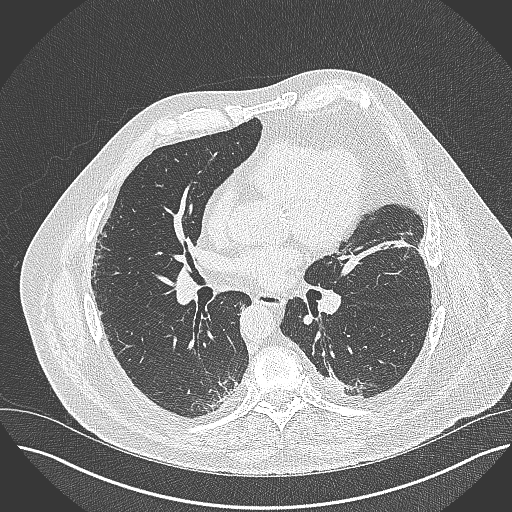
[im 212/363  lung]
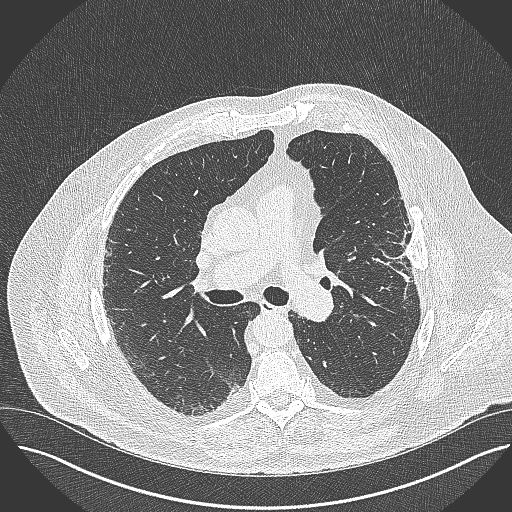
[im 242/363  lung]
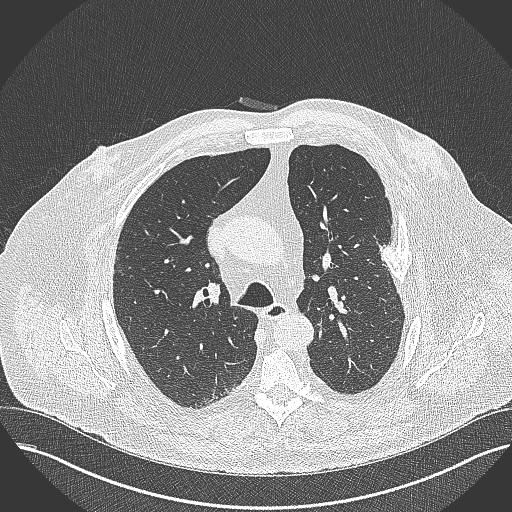
[im 272/363  mediastinal]
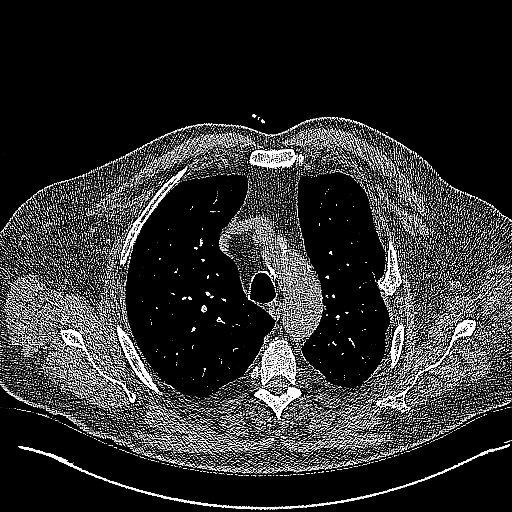
[im 272/363  lung]
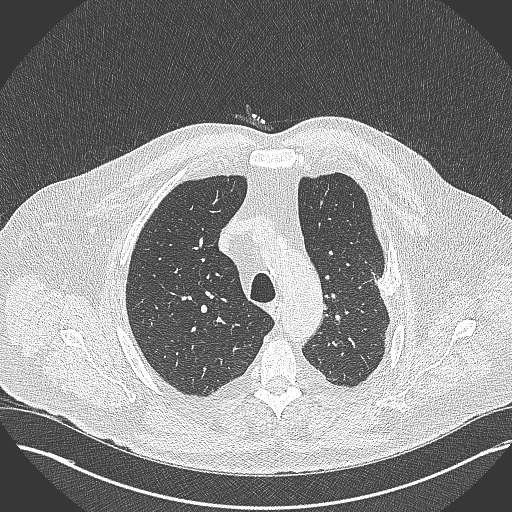
[im 302/363  lung]
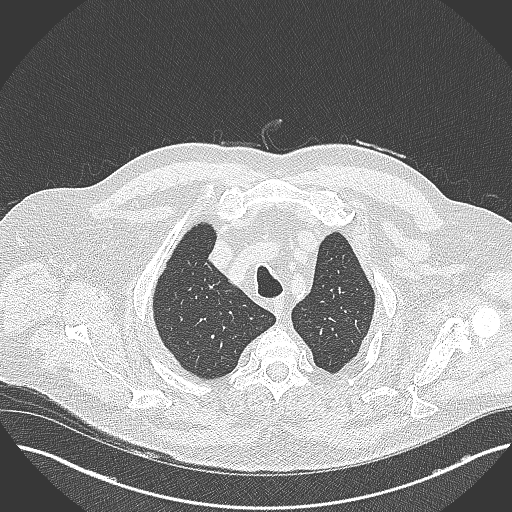
[im 332/363  lung]
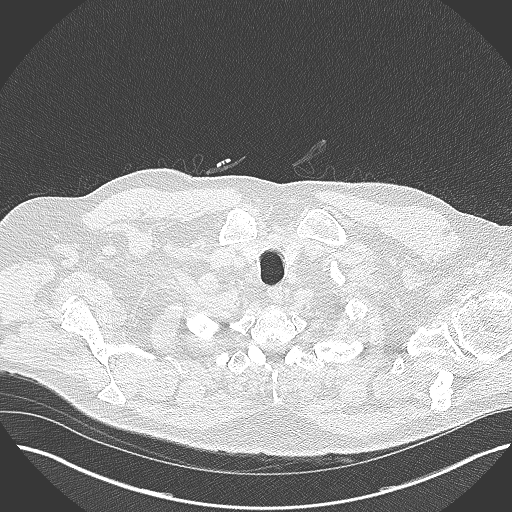

[Series 7: coronal · coronal · 0.70mm/px · 3 of 151 slices shown]
[im 31/151  lung]
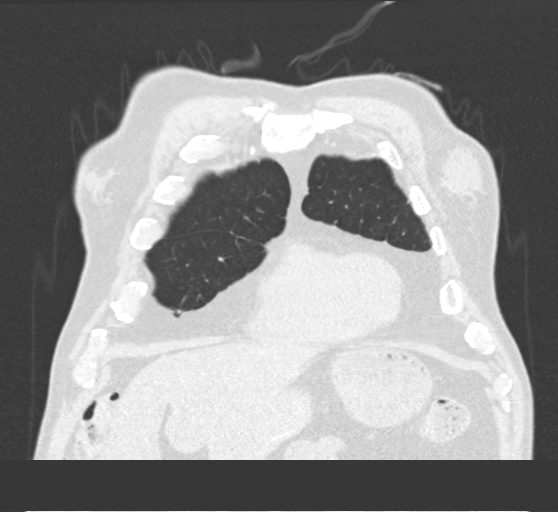
[im 61/151  lung]
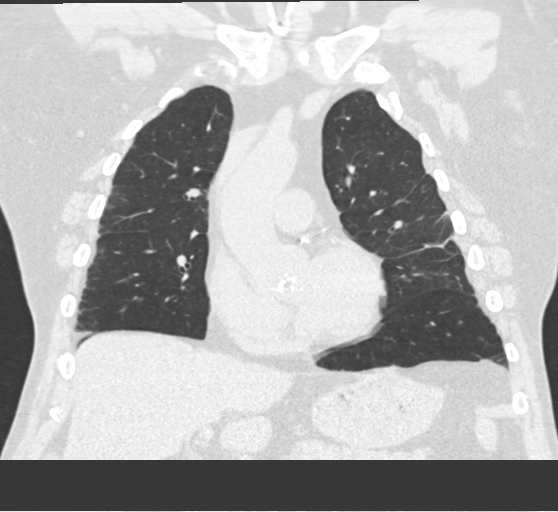
[im 91/151  lung]
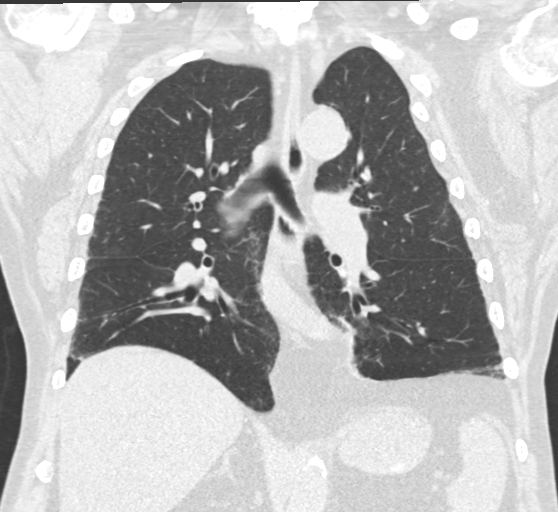

[14 of 36 positions shown; findings below may reference images not displayed]

FINDINGS: Cardiovascular: Normal heart size. No pericardial effusion. Lad and
RCA coronary artery calcifications. Atherosclerotic disease of the
thoracic aorta.

Mediastinum/Nodes: Small hiatal hernia. Normal thyroid. Normal
caliber thoracic aorta with atherosclerotic disease. No
pathologically enlarged lymph nodes seen in the chest.

Lungs/Pleura: Central airways are patent. No evidence of air
trapping. Mild subpleural reticular opacities which persist with
prone imaging and are unchanged when compared to prior exam. No
consolidation, pleural effusion pneumothorax. Stable solid nodule
the right lower lobe measuring 6 mm in mean diameter on series 5,
image 280. Additional linear opacities with associated
bronchiectasis are seen in the right lower lobe and left upper lobe
which are likely due to scarring.

Upper Abdomen: No acute abnormality.

Musculoskeletal: Chronic left rib deformities. No chest wall mass or
suspicious bone lesions identified.
IMPRESSION: 1. Subpleural lower lung predominant reticular opacities with no
evidence of progression when compared to prior exam. Findings are
indeterminate for UIP per consensus guidelines: Diagnosis of
Idiopathic Pulmonary Fibrosis: An Official ATS/ERS/JRS/ALAT Clinical
[id number][DATE].
2. Aortic Atherosclerosis ([id number]).

## 2022-07-14 ENCOUNTER — Ambulatory Visit: Payer: Medicare Other | Attending: Interventional Cardiology

## 2022-07-14 DIAGNOSIS — E782 Mixed hyperlipidemia: Secondary | ICD-10-CM

## 2022-07-14 LAB — HEPATIC FUNCTION PANEL
ALT: 42 IU/L (ref 0–44)
AST: 33 IU/L (ref 0–40)
Albumin: 4.4 g/dL (ref 3.8–4.8)
Alkaline Phosphatase: 46 IU/L (ref 44–121)
Bilirubin Total: 0.7 mg/dL (ref 0.0–1.2)
Bilirubin, Direct: 0.23 mg/dL (ref 0.00–0.40)
Total Protein: 7.1 g/dL (ref 6.0–8.5)

## 2022-07-14 LAB — LIPID PANEL
Chol/HDL Ratio: 2.7 ratio (ref 0.0–5.0)
Cholesterol, Total: 189 mg/dL (ref 100–199)
HDL: 71 mg/dL (ref >39)
LDL Chol Calc (NIH): 103 mg/dL — ABNORMAL HIGH (ref 0–99)
Triglycerides: 85 mg/dL (ref 0–149)
VLDL Cholesterol Cal: 15 mg/dL (ref 5–40)

## 2022-07-23 ENCOUNTER — Other Ambulatory Visit: Payer: Self-pay | Admitting: *Deleted

## 2022-07-23 DIAGNOSIS — E782 Mixed hyperlipidemia: Secondary | ICD-10-CM

## 2022-07-23 NOTE — Progress Notes (Signed)
Referral to see Dr Debara Pickett

## 2022-07-26 DIAGNOSIS — C44629 Squamous cell carcinoma of skin of left upper limb, including shoulder: Secondary | ICD-10-CM | POA: Diagnosis not present

## 2022-07-26 DIAGNOSIS — L570 Actinic keratosis: Secondary | ICD-10-CM | POA: Diagnosis not present

## 2022-09-14 ENCOUNTER — Ambulatory Visit: Payer: Medicare Other | Admitting: Internal Medicine

## 2022-09-14 ENCOUNTER — Encounter: Payer: Self-pay | Admitting: Internal Medicine

## 2022-09-14 VITALS — BP 126/72 | HR 68 | Ht 71.0 in | Wt 232.2 lb

## 2022-09-14 DIAGNOSIS — Z7185 Encounter for immunization safety counseling: Secondary | ICD-10-CM

## 2022-09-14 DIAGNOSIS — J439 Emphysema, unspecified: Secondary | ICD-10-CM | POA: Diagnosis not present

## 2022-09-14 DIAGNOSIS — J849 Interstitial pulmonary disease, unspecified: Secondary | ICD-10-CM

## 2022-09-14 DIAGNOSIS — Z720 Tobacco use: Secondary | ICD-10-CM | POA: Diagnosis not present

## 2022-09-14 MED ORDER — TRELEGY ELLIPTA 100-62.5-25 MCG/ACT IN AEPB: 1.0000 | INHALATION_SPRAY | Freq: Every day | RESPIRATORY_TRACT | 0 refills | Status: DC

## 2022-09-14 MED ORDER — TRELEGY ELLIPTA 100-62.5-25 MCG/ACT IN AEPB: 1.0000 | INHALATION_SPRAY | Freq: Every day | RESPIRATORY_TRACT | 2 refills | Status: DC

## 2022-09-14 NOTE — Patient Instructions (Addendum)
Pulmonary emphysema, unspecified emphysema type ([place]) ILD (interstitial lung disease) ([place]) Cigar smoker    Stable overall Glad you changed atenolol to metoprplol  Plan  -  continue breztri as before with albuterol as needed  -  ok to sample 1 sample trelegy scheduled for  2 weeks and reassess - stop fishoil - do spiro and dlco in 6 months - do HRCT in 6 months (1 yer followup) - QUit smoking  Vaccine counseling  Plan  - recommend RSV vacccine  - Please talk to PCP Josetta Huddle, MD -  and ensure you get  shingrix (GSK) inactivated vaccine against shingles    Nasal Congestion  Plan  - restart saline nasal spray daily at night -  continue generic fluticasone inhaler 2 squirts each nostril daily in morning +  can take at night as well  Follow-up -Return in 6 months  to see Dr. Chase Caller after CT and PFT  - symptom score and walk test during following

## 2022-09-14 NOTE — Progress Notes (Signed)
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 (two) times daily with a meal., Disp: , Rfl:    pantoprazole (PROTONIX) 40 MG tablet, Take 40 mg by mouth See admin instructions. DAILY EXCEPT Tuesday & Thursday., Disp: , Rfl:    pravastatin (PRAVACHOL) 80 MG tablet, Take 1 tablet (80 mg total) by mouth every evening., Disp: 90 tablet, Rfl: 3   spironolactone (ALDACTONE) 25 MG tablet, Take 25 mg by mouth daily., Disp: , Rfl:    tamsulosin (FLOMAX) 0.4 MG CAPS capsule, Take 0.4 mg by mouth daily. , Disp: , Rfl:    valsartan-hydrochlorothiazide (DIOVAN-HCT) 320-12.5 MG per tablet, Take 1 tablet by mouth daily., Disp: , Rfl:       Objective:   Vitals:   09/14/22 1113  BP: 126/72  Pulse: 68  SpO2: 96%  Weight: 232 lb 3.2 oz (105.3  kg)  Height: '5\' 11"'$  (1.803 m)    Estimated body mass index is 32.39 kg/m as calculated from the following:   Height as of this encounter: '5\' 11"'$  (1.803 m).   Weight as of this encounter: 232 lb 3.2 oz (105.3 kg).  '@WEIGHTCHANGE'$ @  [employer]   09/14/22 1113  Weight: 232 lb 3.2 oz (105.3 kg)     Physical Exam    General: No distress.looks well Neuro: Alert and Oriented x 3. GCS 15. Speech normal Psych: Pleasant Resp:  Barrel Chest - no.  Wheeze - no, Crackles - no, No overt respiratory distress CVS: Normal heart sounds. Murmurs - no Ext: Stigmata of Connective Tissue Disease - no HEENT: Normal upper  airway. PEERL +. No post nasal drip        Assessment:       ICD-10-CM   1. Pulmonary emphysema, unspecified emphysema type ([place])  J43.9 Pulmonary function test    CT Chest High Resolution    2. ILD (interstitial lung disease) ([place])  J84.9 Pulmonary function test    CT Chest High Resolution    3. Tobacco abuse  Z72.0     4. Vaccine counseling  Z71.85          Plan:     Patient Instructions  Pulmonary emphysema, unspecified emphysema type ([place]) ILD (interstitial lung disease) ([place]) Cigar smoker    Stable overall Glad you changed atenolol to metoprplol  Plan  -  continue breztri as before with albuterol as needed  -  ok to sample 1 sample trelegy scheduled for  2 weeks and reassess - stop fishoil - do spiro and dlco in 6 months - do HRCT in 6 months (1 yer followup) - QUit smoking  Vaccine counseling  Plan  - recommend RSV vacccine  - Please talk to PCP Rodney Huddle, MD -  and ensure you get  shingrix ([place]) inactivated vaccine against shingles    Nasal Congestion  Plan  - restart saline nasal spray daily at night -  continue generic fluticasone inhaler 2 squirts each nostril daily in morning +  can take at night as well  Follow-up -Return in 6 months  to see Dr. Chase Caller after CT and PFT  - symptom score and walk test during  following    SIGNATURE    Dr. Brand Males, M.D., F.C.C.P,  Pulmonary and Critical Care Medicine Staff Physician, [place] Director - Interstitial Lung Disease  Program  [facility] [place], [place], [address]  Pager: [phone], If no answer or between  15:00h - 7:00h: call [phone] Telephone: [phone]  12:02 PM 09/14/2022

## 2022-10-05 ENCOUNTER — Other Ambulatory Visit: Payer: Self-pay | Admitting: Internal Medicine

## 2022-10-06 MED ORDER — ALBUTEROL SULFATE HFA 108 (90 BASE) MCG/ACT IN AERS: 2.0000 | INHALATION_SPRAY | Freq: Four times a day (QID) | RESPIRATORY_TRACT | 1 refills | Status: AC | PRN

## 2022-10-06 NOTE — Addendum Note (Signed)
Addended by: Lorretta Harp on: 10/06/2022 02:20 PM   Modules accepted: Orders

## 2022-12-06 DIAGNOSIS — C44622 Squamous cell carcinoma of skin of right upper limb, including shoulder: Secondary | ICD-10-CM | POA: Diagnosis not present

## 2022-12-06 DIAGNOSIS — L821 Other seborrheic keratosis: Secondary | ICD-10-CM | POA: Diagnosis not present

## 2022-12-06 DIAGNOSIS — D485 Neoplasm of uncertain behavior of skin: Secondary | ICD-10-CM | POA: Diagnosis not present

## 2022-12-06 DIAGNOSIS — D229 Melanocytic nevi, unspecified: Secondary | ICD-10-CM | POA: Diagnosis not present

## 2022-12-06 DIAGNOSIS — L538 Other specified erythematous conditions: Secondary | ICD-10-CM | POA: Diagnosis not present

## 2022-12-06 DIAGNOSIS — L578 Other skin changes due to chronic exposure to nonionizing radiation: Secondary | ICD-10-CM | POA: Diagnosis not present

## 2022-12-06 DIAGNOSIS — L244 Irritant contact dermatitis due to drugs in contact with skin: Secondary | ICD-10-CM | POA: Diagnosis not present

## 2022-12-06 DIAGNOSIS — L298 Other pruritus: Secondary | ICD-10-CM | POA: Diagnosis not present

## 2022-12-06 DIAGNOSIS — R208 Other disturbances of skin sensation: Secondary | ICD-10-CM | POA: Diagnosis not present

## 2022-12-06 DIAGNOSIS — L820 Inflamed seborrheic keratosis: Secondary | ICD-10-CM | POA: Diagnosis not present

## 2022-12-09 DIAGNOSIS — D485 Neoplasm of uncertain behavior of skin: Secondary | ICD-10-CM | POA: Diagnosis not present

## 2023-01-04 DIAGNOSIS — D0461 Carcinoma in situ of skin of right upper limb, including shoulder: Secondary | ICD-10-CM | POA: Diagnosis not present

## 2023-01-04 DIAGNOSIS — L578 Other skin changes due to chronic exposure to nonionizing radiation: Secondary | ICD-10-CM | POA: Diagnosis not present

## 2023-01-04 DIAGNOSIS — L244 Irritant contact dermatitis due to drugs in contact with skin: Secondary | ICD-10-CM | POA: Diagnosis not present

## 2023-01-14 ENCOUNTER — Ambulatory Visit (HOSPITAL_BASED_OUTPATIENT_CLINIC_OR_DEPARTMENT_OTHER): Payer: Medicare Other | Admitting: Internal Medicine

## 2023-01-14 ENCOUNTER — Encounter (HOSPITAL_BASED_OUTPATIENT_CLINIC_OR_DEPARTMENT_OTHER): Payer: Self-pay | Admitting: Internal Medicine

## 2023-01-14 VITALS — BP 123/80 | HR 77 | Ht 71.0 in | Wt 231.0 lb

## 2023-01-14 DIAGNOSIS — E785 Hyperlipidemia, unspecified: Secondary | ICD-10-CM | POA: Diagnosis not present

## 2023-01-14 DIAGNOSIS — I2584 Coronary atherosclerosis due to calcified coronary lesion: Secondary | ICD-10-CM

## 2023-01-14 DIAGNOSIS — I2510 Atherosclerotic heart disease of native coronary artery without angina pectoris: Secondary | ICD-10-CM

## 2023-01-14 DIAGNOSIS — I10 Essential (primary) hypertension: Secondary | ICD-10-CM | POA: Diagnosis not present

## 2023-01-14 DIAGNOSIS — E782 Mixed hyperlipidemia: Secondary | ICD-10-CM | POA: Diagnosis not present

## 2023-01-14 NOTE — Patient Instructions (Signed)
Medication Instructions:  NO CHANGES today  *If you need a refill on your cardiac medications before your next appointment, please call your pharmacy*   Lab Work: NMR lipoprofile, LPa today   FASTING labs before next visit  If you have labs (blood work) drawn today and your tests are completely normal, you will receive your results only by: MyChart Message (if you have MyChart) OR A paper copy in the mail If you have any lab test that is abnormal or we need to change your treatment, we will call you to review the results.   Follow-Up: At [facility], you and your health needs are our priority.  As part of our continuing mission to provide you with exceptional heart care, we have created designated Provider Care Teams.  These Care Teams include your primary Cardiologist (physician) and Advanced Practice Providers (APPs -  Physician Assistants and Nurse Practitioners) who all work together to provide you with the care you need, when you need it.  We recommend signing up for the patient portal called "MyChart".  Sign up information is provided on this After Visit Summary.  MyChart is used to connect with patients for Virtual Visits (Telemedicine).  Patients are able to view lab/test results, encounter notes, upcoming appointments, etc.  Non-urgent messages can be sent to your provider as well.   To learn more about what you can do with MyChart, go to [email or web address].    Your next appointment:    4 months with [name]
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
## 2023-01-17 ENCOUNTER — Other Ambulatory Visit: Payer: Self-pay | Admitting: Internal Medicine

## 2023-01-17 LAB — NMR, LIPOPROFILE
Cholesterol, Total: 182 mg/dL (ref 100–199)
HDL Particle Number: 46.3 umol/L (ref >=30.5)
HDL-C: 84 mg/dL (ref >39)
LDL Particle Number: 868 nmol/L (ref <1000)
LDL Size: 21 nm (ref >20.5)
LDL-C (NIH Calc): 84 mg/dL (ref 0–99)
LP-IR Score: 33 (ref <=45)
Small LDL Particle Number: 270 nmol/L (ref <=527)
Triglycerides: 76 mg/dL (ref 0–149)

## 2023-01-17 LAB — LIPOPROTEIN A (LPA): Lipoprotein (a): 37.3 nmol/L (ref <75.0)

## 2023-01-24 ENCOUNTER — Telehealth: Payer: Self-pay | Admitting: Internal Medicine

## 2023-01-24 ENCOUNTER — Other Ambulatory Visit: Payer: Self-pay | Admitting: Internal Medicine

## 2023-01-24 DIAGNOSIS — E785 Hyperlipidemia, unspecified: Secondary | ICD-10-CM

## 2023-01-24 MED ORDER — NEXLETOL 180 MG PO TABS: 1.0000 | ORAL_TABLET | Freq: Every day | ORAL | 1 refills | Status: DC

## 2023-01-24 NOTE — Telephone Encounter (Signed)
Patient given lab results.  Agrees to start Nexletol. Please advise dosage and will sned to his [facility] on [address]

## 2023-01-24 NOTE — Telephone Encounter (Signed)
Patient is returning call to discuss lab results. 

## 2023-01-24 NOTE — Telephone Encounter (Signed)
Rx(s) sent to pharmacy electronically.  Message sent to Rx PA team  NMR ordered/mailed -- to be done in 3-4 months

## 2023-01-25 ENCOUNTER — Other Ambulatory Visit (HOSPITAL_COMMUNITY): Payer: Self-pay

## 2023-01-25 ENCOUNTER — Telehealth: Payer: Self-pay

## 2023-01-25 NOTE — Telephone Encounter (Signed)
-----   Message from Carolan Clines, CPhT sent at 01/25/2023 11:23 AM EDT ----- Regarding: RE: nexletol 180mg  PA A p/a for Nexletol has now been submitted, and is currently pending a determination..   Key: [id number] ----- Message ----- From: Lindell Spar, RN Sent: 01/24/2023   1:29 PM EDT To: Rx Prior Auth Team Subject: nexletol 180mg  PA                              Hello! This patient needs a PA for Nexletol please. Thanks!

## 2023-01-26 ENCOUNTER — Other Ambulatory Visit (HOSPITAL_COMMUNITY): Payer: Self-pay

## 2023-01-27 ENCOUNTER — Telehealth: Payer: Self-pay | Admitting: Internal Medicine

## 2023-01-27 NOTE — Telephone Encounter (Signed)
Follow Up:  Ref# [id number]     He is following up on the prior authorization for Nexletol

## 2023-01-28 ENCOUNTER — Ambulatory Visit (HOSPITAL_BASED_OUTPATIENT_CLINIC_OR_DEPARTMENT_OTHER): Payer: Medicare Other | Admitting: Student

## 2023-01-28 ENCOUNTER — Other Ambulatory Visit (HOSPITAL_COMMUNITY): Payer: Self-pay

## 2023-01-28 ENCOUNTER — Encounter (HOSPITAL_BASED_OUTPATIENT_CLINIC_OR_DEPARTMENT_OTHER): Payer: Self-pay | Admitting: Student

## 2023-01-28 ENCOUNTER — Ambulatory Visit (HOSPITAL_BASED_OUTPATIENT_CLINIC_OR_DEPARTMENT_OTHER): Payer: Medicare Other

## 2023-01-28 DIAGNOSIS — M1712 Unilateral primary osteoarthritis, left knee: Secondary | ICD-10-CM | POA: Diagnosis not present

## 2023-01-28 DIAGNOSIS — M25562 Pain in left knee: Secondary | ICD-10-CM

## 2023-01-28 DIAGNOSIS — M25462 Effusion, left knee: Secondary | ICD-10-CM | POA: Diagnosis not present

## 2023-01-28 MED ORDER — TRIAMCINOLONE ACETONIDE 40 MG/ML IJ SUSP
2.0000 mL | INTRAMUSCULAR | Status: AC | PRN
Start: 2023-01-28 — End: 2023-01-28
  Administered 2023-01-28: 2 mL via INTRA_ARTICULAR

## 2023-01-28 MED ORDER — LIDOCAINE HCL 1 % IJ SOLN
4.0000 mL | INTRAMUSCULAR | Status: AC | PRN
Start: 2023-01-28 — End: 2023-01-28
  Administered 2023-01-28: 4 mL

## 2023-01-28 NOTE — Telephone Encounter (Signed)
Update:  While calling the plan to check for an update. I am being told the Prior Authorization is still pending. Earlier I was told a decision would be made by 3:25 today on 01/28/23. Unfortunately that wasn't the case. I am now requesting to speak to a supervisor.
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
## 2023-01-31 ENCOUNTER — Other Ambulatory Visit: Payer: Self-pay | Admitting: Internal Medicine

## 2023-01-31 NOTE — Telephone Encounter (Signed)
Please note that I have spoke with the pts plan this morning in regards to the rx prescribed-''Nexletol''. The patients insurance is asking that the patient has tried/failed 2 of the remaining drugs. Which are;  combo (ezetimibe/simvastatin) which is Vytorin, fluvastatin, lovastatin, and simvastatin. Pt jas previously been on simvastatin but no other drug that the plans prefers. If the patient can't take the specific medications listed then insurance will need documentation/appeal stating that.     Please see the denial letter in the patients chart

## 2023-01-31 NOTE — Telephone Encounter (Signed)
Chrystie Nose, MD  Lindell Spar, RN; Tipps, Kennyth Lose T, CPhT Caller: Unspecified (6 days ago, 11:24 AM) What?? He is on pravastatin 80 mg daily and zetia 10 mg daily and remains above target LDL >70. So the options would be Nexletol or pursuing a PCSK9i.  Simvastatin and rosuvastatin were not tolerated. Could appeal Nexletol or switch to a PCSK9i, which should be approved since he is on max-tolerated statin and zetia and not at goal LDL.  [name]

## 2023-02-01 ENCOUNTER — Telehealth: Payer: Self-pay | Admitting: Internal Medicine

## 2023-02-01 ENCOUNTER — Other Ambulatory Visit (HOSPITAL_COMMUNITY): Payer: Self-pay

## 2023-02-01 NOTE — Telephone Encounter (Signed)
LVM to please call our office 

## 2023-02-01 NOTE — Telephone Encounter (Signed)
Pt c/o medication issue:  1. Name of Medication:   Bempedoic Acid (NEXLETOL) 180 MG TABS   2. How are you currently taking this medication (dosage and times per day)?   Not taking as yet  3. Are you having a reaction (difficulty breathing--STAT)?   4. What is your medication issue?   Patient stated his insurance will not cover this medication as yet and it is too expensive.  Patient wants assistance getting this medication or alternate medication.

## 2023-02-02 NOTE — Telephone Encounter (Signed)
Left voicemail for patient to return call to office. 

## 2023-02-02 NOTE — Telephone Encounter (Signed)
Per Hilty MD -- PCSK9i would be preferred d/t cost. LM for patient to call back to discuss.   Chrystie Nose, MD  to Tipps, Launa Grill, CPhT  Me     02/01/23  4:51 PM  I would prefer the PCSK9i and I think he would from a cost standpoint- would pursue PA for PCSK9 inhibitor instead.  [name]

## 2023-02-03 ENCOUNTER — Telehealth: Payer: Self-pay | Admitting: Internal Medicine

## 2023-02-03 NOTE — Telephone Encounter (Signed)
Duplicate encounter. Please see previous encounter.  

## 2023-02-03 NOTE — Telephone Encounter (Signed)
Patient states Nexletol and its costing $300. First he would like to know hoe long [name] plan to keep him on Nexletol. He also would like to know if there is another cheaper option.

## 2023-02-03 NOTE — Telephone Encounter (Addendum)
Spoke with patient who about Nexletol/PCSK9. Provided information about both medications. Explained that copays with PCSK9 are much less even with Nexletol appeal, per notes from CPhT. He does not want to take an injection and will pay for Nexletol. Advised that he apply for a [employer] grant but did not wish to take down any of this information right now. Will update MD and send message to CPpT about appeal

## 2023-02-03 NOTE — Telephone Encounter (Signed)
Follow Up:       Patient is returning a call from 02-02-23, concerning his medicine.

## 2023-02-07 ENCOUNTER — Other Ambulatory Visit (HOSPITAL_COMMUNITY): Payer: Self-pay

## 2023-02-07 ENCOUNTER — Telehealth: Payer: Self-pay

## 2023-02-07 NOTE — Telephone Encounter (Signed)
Rodney [name],                    An appeal can't be done. However; A tier cost exception can be done. I have now submitted this to the patients plan and listed as Urgent.

## 2023-02-07 NOTE — Telephone Encounter (Signed)
   [id number]. Tier cost exception was completed verbally and faxed documentation to [phone]

## 2023-02-08 NOTE — Telephone Encounter (Signed)
Tier exception has been denied.  Denial letter has been attached in patients media.

## 2023-02-15 NOTE — Telephone Encounter (Signed)
Tier Exception denied

## 2023-02-16 ENCOUNTER — Ambulatory Visit: Payer: Medicare Other | Admitting: Orthopaedic Surgery

## 2023-02-16 DIAGNOSIS — M1712 Unilateral primary osteoarthritis, left knee: Secondary | ICD-10-CM

## 2023-02-16 NOTE — Telephone Encounter (Signed)
Spoke with patient of [name] - explained that appeal was not an option, tier exception denied. He will see PCP in June and do fasting lipid testing. Asked to send [place] the results. Advised that injectable med may be more likely to get approved but he is not interested in that right now. He will keep in contact with [place] if further assistance needed. Advised we can provide samples every so often, but this would not be a viable long-term options with his cost + dependency on samples.
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
## 2023-02-17 NOTE — Telephone Encounter (Addendum)
Received paperwork that patient mailed 02/17/23 -- enclosed was Nexletol denial letter and then also an appeal approval letter dated 02/01/23 that says "we received a request for a fast appeal on 01/25/23...[name] we decided after further review to overturn the denial". The letter was address to [name] but address is not our [facility] locations  Authorization for Nexletol from 01/25/23 -- 09/27/23 Auth #: [id number]  Left message for patient to call back to discuss, as when we talked yesterday, I was not made aware of this letter/approval -- nor has our office received this communication. Advised in voicemail that he can apply for a co-pay grant from the [employer], as I had previously mentioned to him.  [employer]well[employer].[email or web address]  > disease funds >> hypercholesterolemia - medicare access

## 2023-02-22 ENCOUNTER — Other Ambulatory Visit (HOSPITAL_COMMUNITY): Payer: Self-pay

## 2023-02-22 ENCOUNTER — Other Ambulatory Visit: Payer: Self-pay | Admitting: Internal Medicine

## 2023-02-22 DIAGNOSIS — R7309 Other abnormal glucose: Secondary | ICD-10-CM | POA: Diagnosis not present

## 2023-02-22 DIAGNOSIS — K219 Gastro-esophageal reflux disease without esophagitis: Secondary | ICD-10-CM | POA: Diagnosis not present

## 2023-02-22 DIAGNOSIS — R296 Repeated falls: Secondary | ICD-10-CM | POA: Diagnosis not present

## 2023-02-22 DIAGNOSIS — I700 Atherosclerosis of aorta: Secondary | ICD-10-CM | POA: Diagnosis not present

## 2023-02-22 DIAGNOSIS — E782 Mixed hyperlipidemia: Secondary | ICD-10-CM | POA: Diagnosis not present

## 2023-02-22 DIAGNOSIS — R251 Tremor, unspecified: Secondary | ICD-10-CM | POA: Diagnosis not present

## 2023-02-22 DIAGNOSIS — I10 Essential (primary) hypertension: Secondary | ICD-10-CM | POA: Diagnosis not present

## 2023-02-22 DIAGNOSIS — G459 Transient cerebral ischemic attack, unspecified: Secondary | ICD-10-CM | POA: Diagnosis not present

## 2023-02-22 DIAGNOSIS — J439 Emphysema, unspecified: Secondary | ICD-10-CM | POA: Diagnosis not present

## 2023-03-10 ENCOUNTER — Other Ambulatory Visit: Payer: Medicare Other

## 2023-03-14 ENCOUNTER — Ambulatory Visit
Admission: RE | Admit: 2023-03-14 | Discharge: 2023-03-14 | Disposition: A | Discharge status: Home / Self Care | Payer: Medicare Other | Source: Ambulatory Visit | Attending: Internal Medicine | Admitting: Internal Medicine

## 2023-03-14 ENCOUNTER — Telehealth: Payer: Self-pay | Admitting: Interventional Cardiology

## 2023-03-14 DIAGNOSIS — J8410 Pulmonary fibrosis, unspecified: Secondary | ICD-10-CM | POA: Diagnosis not present

## 2023-03-14 DIAGNOSIS — J849 Interstitial pulmonary disease, unspecified: Secondary | ICD-10-CM

## 2023-03-14 DIAGNOSIS — I700 Atherosclerosis of aorta: Secondary | ICD-10-CM | POA: Diagnosis not present

## 2023-03-14 DIAGNOSIS — J439 Emphysema, unspecified: Secondary | ICD-10-CM

## 2023-03-14 DIAGNOSIS — K449 Diaphragmatic hernia without obstruction or gangrene: Secondary | ICD-10-CM | POA: Diagnosis not present

## 2023-03-14 NOTE — Telephone Encounter (Signed)
I spoke with patient.  He thought he had a CT scan ordered by Dr Eldridge Dace for 04/08/23.  I told patient he had CT Scan ordered by Dr Eldridge Dace done in July 2023 and that our office had not ordered any additional CT Scans.  Patient is due for follow up.  I offered appointment with APP but patient would like to see Dr Eldridge Dace.  Appointment made with Dr Eldridge Dace for November 4 at 10:40.  Patient will also follow up with Dr Rennis Golden as planned

## 2023-03-14 NOTE — Telephone Encounter (Signed)
Pt came by today inquiring about a cardiac ct. I showed no active request or scheduled appt. Please call pt and advise what next steps are.

## 2023-03-14 NOTE — Telephone Encounter (Signed)
Patient has a CT ordered by pulmonary scheduled for today.  Patient also needs follow up with Dr Eldridge Dace scheduled.  Call placed to patient.  Left message to call office

## 2023-03-21 DIAGNOSIS — R7303 Prediabetes: Secondary | ICD-10-CM | POA: Diagnosis not present

## 2023-03-21 DIAGNOSIS — R251 Tremor, unspecified: Secondary | ICD-10-CM | POA: Diagnosis not present

## 2023-03-21 DIAGNOSIS — R296 Repeated falls: Secondary | ICD-10-CM | POA: Diagnosis not present

## 2023-03-21 DIAGNOSIS — R7309 Other abnormal glucose: Secondary | ICD-10-CM | POA: Diagnosis not present

## 2023-03-21 DIAGNOSIS — D7589 Other specified diseases of blood and blood-forming organs: Secondary | ICD-10-CM | POA: Diagnosis not present

## 2023-03-21 DIAGNOSIS — G459 Transient cerebral ischemic attack, unspecified: Secondary | ICD-10-CM | POA: Diagnosis not present

## 2023-03-21 DIAGNOSIS — R42 Dizziness and giddiness: Secondary | ICD-10-CM | POA: Diagnosis not present

## 2023-03-21 DIAGNOSIS — K219 Gastro-esophageal reflux disease without esophagitis: Secondary | ICD-10-CM | POA: Diagnosis not present

## 2023-03-21 DIAGNOSIS — I10 Essential (primary) hypertension: Secondary | ICD-10-CM | POA: Diagnosis not present

## 2023-03-21 DIAGNOSIS — E782 Mixed hyperlipidemia: Secondary | ICD-10-CM | POA: Diagnosis not present

## 2023-03-21 DIAGNOSIS — I700 Atherosclerosis of aorta: Secondary | ICD-10-CM | POA: Diagnosis not present

## 2023-03-21 DIAGNOSIS — M792 Neuralgia and neuritis, unspecified: Secondary | ICD-10-CM | POA: Diagnosis not present

## 2023-03-21 DIAGNOSIS — Z0000 Encounter for general adult medical examination without abnormal findings: Secondary | ICD-10-CM | POA: Diagnosis not present

## 2023-03-22 ENCOUNTER — Other Ambulatory Visit: Payer: Self-pay | Admitting: Internal Medicine

## 2023-03-22 ENCOUNTER — Other Ambulatory Visit: Payer: Self-pay

## 2023-03-22 DIAGNOSIS — R42 Dizziness and giddiness: Secondary | ICD-10-CM

## 2023-03-23 ENCOUNTER — Ambulatory Visit: Payer: Medicare Other | Admitting: Internal Medicine

## 2023-03-23 ENCOUNTER — Encounter: Payer: Self-pay | Admitting: Internal Medicine

## 2023-03-23 ENCOUNTER — Ambulatory Visit (INDEPENDENT_AMBULATORY_CARE_PROVIDER_SITE_OTHER): Payer: Medicare Other | Admitting: Internal Medicine

## 2023-03-23 VITALS — BP 120/80 | HR 81 | Ht 70.0 in | Wt 221.4 lb

## 2023-03-23 DIAGNOSIS — Z7185 Encounter for immunization safety counseling: Secondary | ICD-10-CM | POA: Diagnosis not present

## 2023-03-23 DIAGNOSIS — J849 Interstitial pulmonary disease, unspecified: Secondary | ICD-10-CM

## 2023-03-23 DIAGNOSIS — Z720 Tobacco use: Secondary | ICD-10-CM | POA: Diagnosis not present

## 2023-03-23 DIAGNOSIS — J439 Emphysema, unspecified: Secondary | ICD-10-CM

## 2023-03-23 LAB — PULMONARY FUNCTION TEST
DL/VA: 3.57 ml/min/mmHg/L
DLCO unc % pred: 78 %
DLCO unc: 19.61 ml/min/mmHg
FEF 25-75 Pre: 1.38 L/sec
FEV6-%Pred-Pre: 84 %
FEV6FVC-%Pred-Pre: 104 %
Pre FEV6/FVC Ratio: 98 %

## 2023-03-23 MED ORDER — TRELEGY ELLIPTA 100-62.5-25 MCG/ACT IN AEPB: 1.0000 | INHALATION_SPRAY | Freq: Every day | RESPIRATORY_TRACT | 0 refills | Status: DC

## 2023-03-23 NOTE — Patient Instructions (Addendum)
Pulmonary emphysema, unspecified emphysema type (HCC) ILD (interstitial lung disease) (HCC) Cigar smoker    Stable overall with normal pulmonary function test and CT scan with just mild interstitial abnormalities. Due to ongoing intermittent cigar smoking continue to be at risk for lung cancer but currently there is no lung cancer either. Glad Trelegy is helping you  Plan  -  continue Trelegy  as before with albuterol as needed  -Take sample - do HRCT in 6 months (1 yer followup) - QUit smoking  Vaccine counseling  Plan  - recommend RSV vacccine  - Please talk to PCP Rodney Aspen, Rodney [name] -  -  and ensure you get  shingrix (GSK) inactivated vaccine against shingles    Nasal Congestion  Plan  - restart saline nasal spray daily at night -  continue generic fluticasone inhaler 2 squirts each nostril daily in morning +  can take at night as well  Follow-up -Return in 12 months  to see Rodney [name] after CT   - symptom score and walk test during following

## 2023-03-23 NOTE — Addendum Note (Signed)
Addended by: Hedda Slade on: 03/23/2023 03:41 PM   Modules accepted: Orders
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
## 2023-03-23 NOTE — Progress Notes (Signed)
Spiro/DLCO performed today.  

## 2023-03-23 NOTE — Patient Instructions (Signed)
Spiro/DLCO performed today.  

## 2023-03-25 LAB — PULMONARY FUNCTION TEST
DL/VA % pred: 90 %
DLCO cor % pred: 78 %
DLCO cor: 19.61 ml/min/mmHg
FEF2575-%Pred-Pre: 64 %
FEV1-%Pred-Pre: 78 %
FEV1-Pre: 2.34 L
FEV1FVC-%Pred-Pre: 96 %
FEV6-Pre: 3.3 L
FVC-%Pred-Pre: 80 %
Pre FEV1/FVC ratio: 70 %

## 2023-03-30 ENCOUNTER — Encounter (HOSPITAL_COMMUNITY): Payer: Self-pay | Admitting: Emergency Medicine

## 2023-03-30 ENCOUNTER — Ambulatory Visit
Admission: RE | Admit: 2023-03-30 | Discharge: 2023-03-30 | Disposition: A | Discharge status: Home / Self Care | Payer: Medicare Other | Source: Ambulatory Visit | Attending: Internal Medicine | Admitting: Internal Medicine

## 2023-03-30 ENCOUNTER — Other Ambulatory Visit: Payer: Self-pay

## 2023-03-30 ENCOUNTER — Emergency Department (HOSPITAL_COMMUNITY)
Admission: EM | Admit: 2023-03-30 | Discharge: 2023-03-30 | Disposition: A | Discharge status: Home / Self Care | Payer: Medicare Other | Attending: Emergency Medicine | Admitting: Emergency Medicine

## 2023-03-30 DIAGNOSIS — W01198A Fall on same level from slipping, tripping and stumbling with subsequent striking against other object, initial encounter: Secondary | ICD-10-CM | POA: Insufficient documentation

## 2023-03-30 DIAGNOSIS — Y92000 Kitchen of unspecified non-institutional (private) residence as  the place of occurrence of the external cause: Secondary | ICD-10-CM | POA: Diagnosis not present

## 2023-03-30 DIAGNOSIS — R296 Repeated falls: Secondary | ICD-10-CM | POA: Diagnosis not present

## 2023-03-30 DIAGNOSIS — S0003XA Contusion of scalp, initial encounter: Secondary | ICD-10-CM | POA: Diagnosis not present

## 2023-03-30 DIAGNOSIS — S0990XA Unspecified injury of head, initial encounter: Secondary | ICD-10-CM | POA: Insufficient documentation

## 2023-03-30 DIAGNOSIS — I6200 Nontraumatic subdural hemorrhage, unspecified: Secondary | ICD-10-CM | POA: Diagnosis not present

## 2023-03-30 NOTE — ED Provider Notes (Signed)
[place] EMERGENCY DEPARTMENT AT [facility] Provider Note   CSN: [id number] Arrival date & time: 03/30/23  1601     History Chief Complaint  Patient presents with   Head Injury    HPI Rodney [name] is a 77 y.o. male presenting for chief complaint of fall approximately 31 days ago. States that he was in his kitchen when he lost his balance and fell backwards hitting his head.  He was told by his PCP should be using walker but is been using a cane.  Plans to get a walker at home.  Has not fallen in the last month.  His PCP had ordered a noncontrasted head scan a month ago and patient just had a performed today.  Was told by radiology to come be evaluated.  Denies any symptoms.  Denies fevers chills nausea vomiting syncope shortness of breath.  Concerning the fall.  He describes mechanical fall he has been having balance issues over the past 5 years on meclizine at baseline.  Has follow-up with neurology for Parkinson's evaluation next month. Notably, patient states that he has a trip planned in the morning and is asking for discharge soon as possible. Completely asymptomatic otherwise  Patient's recorded medical, surgical, social, medication list and allergies were reviewed in the Snapshot window as part of the initial history.   Review of Systems   Review of Systems  Constitutional:  Negative for chills and fever.  HENT:  Negative for ear pain and sore throat.   Eyes:  Negative for pain and visual disturbance.  Respiratory:  Negative for cough and shortness of breath.   Cardiovascular:  Negative for chest pain and palpitations.  Gastrointestinal:  Negative for abdominal pain and vomiting.  Genitourinary:  Negative for dysuria and hematuria.  Musculoskeletal:  Negative for arthralgias and back pain.  Skin:  Negative for color change and rash.  Neurological:  Negative for seizures and syncope.  All other systems reviewed and are negative.   Physical Exam Updated  Vital Signs BP 138/80 (BP Location: Right Arm)   Pulse 64   Temp 97.6 F (36.4 C)   Resp 16   Ht 5\' 10"  (1.778 m)   Wt 99.8 kg   SpO2 96%   BMI 31.57 kg/m  Physical Exam Vitals and nursing note reviewed.  Constitutional:      General: He is not in acute distress.    Appearance: He is well-developed.  HENT:     Head: Normocephalic and atraumatic.  Eyes:     Conjunctiva/sclera: Conjunctivae normal.  Cardiovascular:     Rate and Rhythm: Normal rate and regular rhythm.     Heart sounds: No murmur heard. Pulmonary:     Effort: Pulmonary effort is normal. No respiratory distress.     Breath sounds: Normal breath sounds.  Abdominal:     Palpations: Abdomen is soft.     Tenderness: There is no abdominal tenderness.  Musculoskeletal:        General: No swelling.     Cervical back: Neck supple.  Skin:    General: Skin is warm and dry.     Capillary Refill: Capillary refill takes less than 2 seconds.  Neurological:     Mental Status: He is alert.  Psychiatric:        Mood and Affect: Mood normal.      ED Course/ Medical Decision Making/ A&P    Procedures Procedures   Medications Ordered in ED Medications - No data to display  Medical Decision Making:    Rodney [name] is a 77 y.o. male who presented to the ED today with abnormal imaging detailed above.     Patient placed on continuous vitals and telemetry monitoring while in ED which was reviewed periodically.   Complete initial physical exam performed, notably the patient  was completely asymptomatic this time.      Reviewed and confirmed nursing documentation for past medical history, family history, social history.    Initial Assessment:   Patient presenting with a CT head demonstrating a 5 mm midline shift secondary to a subdural.  Consulted neurosurgery who recommended no acute intervention given duration since initial presentation, lack of any acute symptoms and plan to follow-up in their clinic for  stability imaging in the outpatient setting. Disposition:  I have considered need for hospitalization, however, considering all of the above, I believe this patient is stable for discharge at this time.  Patient/family educated about specific return precautions for given chief complaint and symptoms.  Patient/family educated about follow-up with PCP and Neurosurgery.     Patient/family expressed understanding of return precautions and need for follow-up. Patient spoken to regarding all imaging and laboratory results and appropriate follow up for these results. All education provided in verbal form with additional information in written form. Time was allowed for answering of patient questions. Patient discharged.    Emergency Department Medication Summary:   Medications - No data to display       Clinical Impression:  1. Injury of head, initial encounter      Discharge   Final Clinical Impression(s) / ED Diagnoses Final diagnoses:  Injury of head, initial encounter    Rx / DC Orders ED Discharge Orders     None         Glyn Ade, MD 03/30/23 [id number]

## 2023-03-30 NOTE — ED Triage Notes (Signed)
Patient arrives ambulatory by POV after having frequent falls since January. States he fell beginning of June and hit back of his head on kitchen floor. Was seen by his PCP but was unable to get a CT scan until today. Patient has CT head today showing left sided subdural hematoma with 5 mm of rightward midline shift. Patient states he has had intermittent dizziness since falling in June. No blood thinners.

## 2023-03-31 ENCOUNTER — Other Ambulatory Visit: Payer: Self-pay | Admitting: Interventional Cardiology

## 2023-04-01 DIAGNOSIS — S0990XA Unspecified injury of head, initial encounter: Secondary | ICD-10-CM | POA: Diagnosis not present

## 2023-04-05 DIAGNOSIS — S065XAA Traumatic subdural hemorrhage with loss of consciousness status unknown, initial encounter: Secondary | ICD-10-CM | POA: Diagnosis not present

## 2023-04-06 ENCOUNTER — Other Ambulatory Visit: Payer: Self-pay | Admitting: Neurosurgery

## 2023-04-06 DIAGNOSIS — S065XAA Traumatic subdural hemorrhage with loss of consciousness status unknown, initial encounter: Secondary | ICD-10-CM

## 2023-04-15 DIAGNOSIS — R42 Dizziness and giddiness: Secondary | ICD-10-CM | POA: Diagnosis not present

## 2023-04-15 DIAGNOSIS — S065XAA Traumatic subdural hemorrhage with loss of consciousness status unknown, initial encounter: Secondary | ICD-10-CM | POA: Diagnosis not present

## 2023-04-17 ENCOUNTER — Inpatient Hospital Stay (HOSPITAL_BASED_OUTPATIENT_CLINIC_OR_DEPARTMENT_OTHER)
Admission: EM | Admit: 2023-04-17 | Discharge: 2023-04-23 | DRG: 025 | Disposition: A | Discharge status: Home Health | Payer: Medicare Other | Attending: Neurosurgery | Admitting: Neurosurgery

## 2023-04-17 ENCOUNTER — Emergency Department (HOSPITAL_BASED_OUTPATIENT_CLINIC_OR_DEPARTMENT_OTHER): Payer: Medicare Other

## 2023-04-17 ENCOUNTER — Inpatient Hospital Stay (HOSPITAL_COMMUNITY): Payer: Medicare Other | Admitting: Anesthesiology

## 2023-04-17 ENCOUNTER — Encounter (HOSPITAL_COMMUNITY)
Admission: EM | Disposition: A | Discharge status: Home Health | Payer: Self-pay | Source: Home / Self Care | Attending: Neurosurgery

## 2023-04-17 ENCOUNTER — Encounter (HOSPITAL_BASED_OUTPATIENT_CLINIC_OR_DEPARTMENT_OTHER): Payer: Self-pay

## 2023-04-17 ENCOUNTER — Emergency Department (HOSPITAL_BASED_OUTPATIENT_CLINIC_OR_DEPARTMENT_OTHER): Payer: Medicare Other | Admitting: Radiology

## 2023-04-17 ENCOUNTER — Other Ambulatory Visit: Payer: Self-pay

## 2023-04-17 DIAGNOSIS — J449 Chronic obstructive pulmonary disease, unspecified: Secondary | ICD-10-CM | POA: Diagnosis not present

## 2023-04-17 DIAGNOSIS — I959 Hypotension, unspecified: Secondary | ICD-10-CM | POA: Diagnosis not present

## 2023-04-17 DIAGNOSIS — N400 Enlarged prostate without lower urinary tract symptoms: Secondary | ICD-10-CM | POA: Diagnosis present

## 2023-04-17 DIAGNOSIS — R42 Dizziness and giddiness: Secondary | ICD-10-CM | POA: Diagnosis not present

## 2023-04-17 DIAGNOSIS — S41111A Laceration without foreign body of right upper arm, initial encounter: Secondary | ICD-10-CM | POA: Diagnosis not present

## 2023-04-17 DIAGNOSIS — E7800 Pure hypercholesterolemia, unspecified: Secondary | ICD-10-CM | POA: Diagnosis not present

## 2023-04-17 DIAGNOSIS — S065XAA Traumatic subdural hemorrhage with loss of consciousness status unknown, initial encounter: Secondary | ICD-10-CM | POA: Diagnosis not present

## 2023-04-17 DIAGNOSIS — Z96641 Presence of right artificial hip joint: Secondary | ICD-10-CM | POA: Diagnosis present

## 2023-04-17 DIAGNOSIS — I6200 Nontraumatic subdural hemorrhage, unspecified: Secondary | ICD-10-CM | POA: Diagnosis not present

## 2023-04-17 DIAGNOSIS — I6203 Nontraumatic chronic subdural hemorrhage: Secondary | ICD-10-CM | POA: Diagnosis not present

## 2023-04-17 DIAGNOSIS — F10231 Alcohol dependence with withdrawal delirium: Secondary | ICD-10-CM | POA: Diagnosis not present

## 2023-04-17 DIAGNOSIS — Z7951 Long term (current) use of inhaled steroids: Secondary | ICD-10-CM

## 2023-04-17 DIAGNOSIS — R441 Visual hallucinations: Secondary | ICD-10-CM | POA: Diagnosis not present

## 2023-04-17 DIAGNOSIS — E8809 Other disorders of plasma-protein metabolism, not elsewhere classified: Secondary | ICD-10-CM | POA: Diagnosis not present

## 2023-04-17 DIAGNOSIS — W1839XA Other fall on same level, initial encounter: Secondary | ICD-10-CM | POA: Diagnosis present

## 2023-04-17 DIAGNOSIS — K219 Gastro-esophageal reflux disease without esophagitis: Secondary | ICD-10-CM | POA: Diagnosis not present

## 2023-04-17 DIAGNOSIS — E871 Hypo-osmolality and hyponatremia: Secondary | ICD-10-CM | POA: Diagnosis not present

## 2023-04-17 DIAGNOSIS — R519 Headache, unspecified: Secondary | ICD-10-CM | POA: Diagnosis not present

## 2023-04-17 DIAGNOSIS — F17290 Nicotine dependence, other tobacco product, uncomplicated: Secondary | ICD-10-CM | POA: Diagnosis not present

## 2023-04-17 DIAGNOSIS — I10 Essential (primary) hypertension: Secondary | ICD-10-CM

## 2023-04-17 DIAGNOSIS — I452 Bifascicular block: Secondary | ICD-10-CM | POA: Diagnosis not present

## 2023-04-17 DIAGNOSIS — I5032 Chronic diastolic (congestive) heart failure: Secondary | ICD-10-CM | POA: Diagnosis present

## 2023-04-17 DIAGNOSIS — F17200 Nicotine dependence, unspecified, uncomplicated: Secondary | ICD-10-CM | POA: Diagnosis not present

## 2023-04-17 DIAGNOSIS — I700 Atherosclerosis of aorta: Secondary | ICD-10-CM | POA: Diagnosis present

## 2023-04-17 DIAGNOSIS — Z8249 Family history of ischemic heart disease and other diseases of the circulatory system: Secondary | ICD-10-CM

## 2023-04-17 DIAGNOSIS — R296 Repeated falls: Secondary | ICD-10-CM | POA: Diagnosis not present

## 2023-04-17 DIAGNOSIS — G935 Compression of brain: Secondary | ICD-10-CM | POA: Diagnosis present

## 2023-04-17 DIAGNOSIS — S065X0A Traumatic subdural hemorrhage without loss of consciousness, initial encounter: Secondary | ICD-10-CM | POA: Diagnosis not present

## 2023-04-17 DIAGNOSIS — M1990 Unspecified osteoarthritis, unspecified site: Secondary | ICD-10-CM | POA: Diagnosis present

## 2023-04-17 DIAGNOSIS — Z6832 Body mass index (BMI) 32.0-32.9, adult: Secondary | ICD-10-CM

## 2023-04-17 DIAGNOSIS — I110 Hypertensive heart disease with heart failure: Secondary | ICD-10-CM | POA: Diagnosis not present

## 2023-04-17 DIAGNOSIS — Z888 Allergy status to other drugs, medicaments and biological substances status: Secondary | ICD-10-CM

## 2023-04-17 DIAGNOSIS — F10932 Alcohol use, unspecified with withdrawal with perceptual disturbance: Secondary | ICD-10-CM | POA: Diagnosis not present

## 2023-04-17 DIAGNOSIS — G4730 Sleep apnea, unspecified: Secondary | ICD-10-CM | POA: Diagnosis not present

## 2023-04-17 DIAGNOSIS — N179 Acute kidney failure, unspecified: Secondary | ICD-10-CM | POA: Diagnosis not present

## 2023-04-17 DIAGNOSIS — D539 Nutritional anemia, unspecified: Secondary | ICD-10-CM | POA: Diagnosis not present

## 2023-04-17 DIAGNOSIS — F419 Anxiety disorder, unspecified: Secondary | ICD-10-CM | POA: Diagnosis present

## 2023-04-17 DIAGNOSIS — E860 Dehydration: Secondary | ICD-10-CM | POA: Diagnosis not present

## 2023-04-17 DIAGNOSIS — G9389 Other specified disorders of brain: Secondary | ICD-10-CM | POA: Diagnosis not present

## 2023-04-17 DIAGNOSIS — Z79899 Other long term (current) drug therapy: Secondary | ICD-10-CM

## 2023-04-17 DIAGNOSIS — E785 Hyperlipidemia, unspecified: Secondary | ICD-10-CM | POA: Diagnosis not present

## 2023-04-17 DIAGNOSIS — Z8673 Personal history of transient ischemic attack (TIA), and cerebral infarction without residual deficits: Secondary | ICD-10-CM

## 2023-04-17 DIAGNOSIS — Z713 Dietary counseling and surveillance: Secondary | ICD-10-CM

## 2023-04-17 DIAGNOSIS — S2231XA Fracture of one rib, right side, initial encounter for closed fracture: Secondary | ICD-10-CM | POA: Diagnosis not present

## 2023-04-17 DIAGNOSIS — F17210 Nicotine dependence, cigarettes, uncomplicated: Secondary | ICD-10-CM | POA: Diagnosis not present

## 2023-04-17 DIAGNOSIS — Z7982 Long term (current) use of aspirin: Secondary | ICD-10-CM

## 2023-04-17 DIAGNOSIS — E669 Obesity, unspecified: Secondary | ICD-10-CM | POA: Diagnosis present

## 2023-04-17 DIAGNOSIS — I6202 Nontraumatic subacute subdural hemorrhage: Secondary | ICD-10-CM | POA: Diagnosis not present

## 2023-04-17 DIAGNOSIS — Z982 Presence of cerebrospinal fluid drainage device: Secondary | ICD-10-CM | POA: Diagnosis not present

## 2023-04-17 HISTORY — PX: BURR HOLE: SHX908

## 2023-04-17 LAB — BASIC METABOLIC PANEL
Anion gap: 12 (ref 5–15)
BUN: 26 mg/dL — ABNORMAL HIGH (ref 8–23)
CO2: 27 mmol/L (ref 22–32)
Calcium: 10.2 mg/dL (ref 8.9–10.3)
Chloride: 96 mmol/L — ABNORMAL LOW (ref 98–111)
Creatinine, Ser: 1.42 mg/dL — ABNORMAL HIGH (ref 0.61–1.24)
GFR, Estimated: 51 mL/min — ABNORMAL LOW (ref >60)
Glucose, Bld: 107 mg/dL — ABNORMAL HIGH (ref 70–99)
Potassium: 4.6 mmol/L (ref 3.5–5.1)
Sodium: 135 mmol/L (ref 135–145)

## 2023-04-17 LAB — SURGICAL PCR SCREEN
MRSA, PCR: NEGATIVE
Staphylococcus aureus: NEGATIVE

## 2023-04-17 LAB — CBC
HCT: 43.9 % (ref 39.0–52.0)
Hemoglobin: 15 g/dL (ref 13.0–17.0)
MCH: 34.9 pg — ABNORMAL HIGH (ref 26.0–34.0)
MCHC: 34.2 g/dL (ref 30.0–36.0)
MCV: 102.1 fL — ABNORMAL HIGH (ref 80.0–100.0)
Platelets: 275 10*3/uL (ref 150–400)
RBC: 4.3 MIL/uL (ref 4.22–5.81)
RDW: 13.8 % (ref 11.5–15.5)
WBC: 10.7 10*3/uL — ABNORMAL HIGH (ref 4.0–10.5)
nRBC: 0 % (ref 0.0–0.2)

## 2023-04-17 SURGERY — CREATION, CRANIAL BURR HOLE
Anesthesia: General | Site: Head

## 2023-04-17 MED ORDER — BACITRACIN ZINC 500 UNIT/GM EX OINT
TOPICAL_OINTMENT | CUTANEOUS | Status: AC
  Filled 2023-04-17: qty 28.35

## 2023-04-17 MED ORDER — TAMSULOSIN HCL 0.4 MG PO CAPS
0.4000 mg | ORAL_CAPSULE | Freq: Every day | ORAL | Status: DC
  Administered 2023-04-18 – 2023-04-22 (×5): 0.4 mg via ORAL
  Filled 2023-04-17 (×5): qty 1

## 2023-04-17 MED ORDER — PANTOPRAZOLE SODIUM 40 MG PO TBEC
40.0000 mg | DELAYED_RELEASE_TABLET | Freq: Every day | ORAL | Status: DC
  Administered 2023-04-18 – 2023-04-23 (×6): 40 mg via ORAL
  Filled 2023-04-17 (×6): qty 1

## 2023-04-17 MED ORDER — ACETAMINOPHEN 500 MG PO TABS: 1000.0000 mg | ORAL_TABLET | Freq: Once | ORAL | Status: DC

## 2023-04-17 MED ORDER — MAGNESIUM OXIDE -MG SUPPLEMENT 400 (240 MG) MG PO TABS
400.0000 mg | ORAL_TABLET | ORAL | Status: DC
  Administered 2023-04-19 – 2023-04-23 (×3): 400 mg via ORAL
  Filled 2023-04-17 (×4): qty 1

## 2023-04-17 MED ORDER — CEFAZOLIN SODIUM-DEXTROSE 2-3 GM-%(50ML) IV SOLR
INTRAVENOUS | Status: DC | PRN
  Administered 2023-04-17: 2 g via INTRAVENOUS

## 2023-04-17 MED ORDER — ONDANSETRON HCL 4 MG/2ML IJ SOLN
INTRAMUSCULAR | Status: DC | PRN
  Administered 2023-04-17: 4 mg via INTRAVENOUS

## 2023-04-17 MED ORDER — B COMPLEX-C PO TABS
1.0000 | ORAL_TABLET | Freq: Every day | ORAL | Status: DC
  Administered 2023-04-18 – 2023-04-23 (×6): 1 via ORAL
  Filled 2023-04-17 (×6): qty 1

## 2023-04-17 MED ORDER — SPIRONOLACTONE 25 MG PO TABS
25.0000 mg | ORAL_TABLET | Freq: Every day | ORAL | Status: DC
  Administered 2023-04-18 – 2023-04-20 (×3): 25 mg via ORAL
  Filled 2023-04-17 (×3): qty 1

## 2023-04-17 MED ORDER — AMLODIPINE BESYLATE 10 MG PO TABS
10.0000 mg | ORAL_TABLET | Freq: Every day | ORAL | Status: DC
  Administered 2023-04-18 – 2023-04-19 (×2): 10 mg via ORAL
  Filled 2023-04-17 (×2): qty 1

## 2023-04-17 MED ORDER — POTASSIUM 99 MG PO TABS: 1.0000 | ORAL_TABLET | Freq: Every day | ORAL | Status: DC

## 2023-04-17 MED ORDER — FINASTERIDE 5 MG PO TABS
5.0000 mg | ORAL_TABLET | Freq: Every day | ORAL | Status: DC
  Administered 2023-04-18 – 2023-04-23 (×6): 5 mg via ORAL
  Filled 2023-04-17 (×6): qty 1

## 2023-04-17 MED ORDER — FENTANYL CITRATE (PF) 250 MCG/5ML IJ SOLN
INTRAMUSCULAR | Status: AC
  Filled 2023-04-17: qty 5

## 2023-04-17 MED ORDER — CHLORHEXIDINE GLUCONATE CLOTH 2 % EX PADS
6.0000 | MEDICATED_PAD | Freq: Every day | CUTANEOUS | Status: DC
  Administered 2023-04-18 – 2023-04-23 (×6): 6 via TOPICAL

## 2023-04-17 MED ORDER — CEFAZOLIN SODIUM-DEXTROSE 2-4 GM/100ML-% IV SOLN
INTRAVENOUS | Status: AC
  Filled 2023-04-17: qty 100

## 2023-04-17 MED ORDER — LACTATED RINGERS IV SOLN: INTRAVENOUS | Status: DC

## 2023-04-17 MED ORDER — LORAZEPAM 1 MG PO TABS: 0.5000 mg | ORAL_TABLET | ORAL | Status: DC | PRN

## 2023-04-17 MED ORDER — DULOXETINE HCL 60 MG PO CPEP
60.0000 mg | ORAL_CAPSULE | Freq: Every day | ORAL | Status: DC
  Administered 2023-04-18 – 2023-04-22 (×5): 60 mg via ORAL
  Filled 2023-04-17 (×5): qty 1

## 2023-04-17 MED ORDER — ALBUMIN HUMAN 5 % IV SOLN: INTRAVENOUS | Status: DC | PRN

## 2023-04-17 MED ORDER — SODIUM CHLORIDE 0.9 % IV SOLN: INTRAVENOUS | Status: DC | PRN

## 2023-04-17 MED ORDER — VITAMIN D 25 MCG (1000 UNIT) PO TABS
1000.0000 [IU] | ORAL_TABLET | Freq: Every day | ORAL | Status: DC
  Administered 2023-04-18 – 2023-04-23 (×6): 1000 [IU] via ORAL
  Filled 2023-04-17 (×6): qty 1

## 2023-04-17 MED ORDER — PRAVASTATIN SODIUM 40 MG PO TABS
80.0000 mg | ORAL_TABLET | Freq: Every evening | ORAL | Status: DC
  Administered 2023-04-18 – 2023-04-22 (×5): 80 mg via ORAL
  Filled 2023-04-17 (×5): qty 2

## 2023-04-17 MED ORDER — ASPIRIN 81 MG PO TBEC
162.0000 mg | DELAYED_RELEASE_TABLET | Freq: Every day | ORAL | Status: DC
  Administered 2023-04-18 – 2023-04-22 (×5): 162 mg via ORAL
  Filled 2023-04-17 (×5): qty 2

## 2023-04-17 MED ORDER — PHENYLEPHRINE HCL-NACL 20-0.9 MG/250ML-% IV SOLN
INTRAVENOUS | Status: DC | PRN
  Administered 2023-04-17: 80 ug via INTRAVENOUS
  Administered 2023-04-17: 50 ug/min via INTRAVENOUS

## 2023-04-17 MED ORDER — EPHEDRINE SULFATE (PRESSORS) 50 MG/ML IJ SOLN
INTRAMUSCULAR | Status: DC | PRN
  Administered 2023-04-17 (×2): 5 mg via INTRAVENOUS
  Administered 2023-04-17: 10 mg via INTRAVENOUS
  Administered 2023-04-17: 5 mg via INTRAVENOUS

## 2023-04-17 MED ORDER — METOPROLOL SUCCINATE ER 50 MG PO TB24
50.0000 mg | ORAL_TABLET | Freq: Every day | ORAL | Status: DC
  Administered 2023-04-18 – 2023-04-23 (×6): 50 mg via ORAL
  Filled 2023-04-17 (×6): qty 1

## 2023-04-17 MED ORDER — ROCURONIUM BROMIDE 100 MG/10ML IV SOLN
INTRAVENOUS | Status: DC | PRN
  Administered 2023-04-17: 50 mg via INTRAVENOUS

## 2023-04-17 MED ORDER — IRBESARTAN 300 MG PO TABS
300.0000 mg | ORAL_TABLET | Freq: Every day | ORAL | Status: DC
  Administered 2023-04-18 – 2023-04-20 (×3): 300 mg via ORAL
  Filled 2023-04-17: qty 1
  Filled 2023-04-17: qty 2
  Filled 2023-04-17: qty 1

## 2023-04-17 MED ORDER — EZETIMIBE 10 MG PO TABS
10.0000 mg | ORAL_TABLET | Freq: Every day | ORAL | Status: DC
  Administered 2023-04-18 – 2023-04-23 (×6): 10 mg via ORAL
  Filled 2023-04-17 (×6): qty 1

## 2023-04-17 MED ORDER — PROPOFOL 10 MG/ML IV BOLUS
INTRAVENOUS | Status: DC | PRN
  Administered 2023-04-17: 140 mg via INTRAVENOUS

## 2023-04-17 MED ORDER — LIDOCAINE-EPINEPHRINE 1 %-1:100000 IJ SOLN
INTRAMUSCULAR | Status: AC
  Filled 2023-04-17: qty 1

## 2023-04-17 MED ORDER — THROMBIN 20000 UNITS EX SOLR
CUTANEOUS | Status: AC
  Filled 2023-04-17: qty 20000

## 2023-04-17 MED ORDER — THROMBIN 20000 UNITS EX SOLR: CUTANEOUS | Status: DC | PRN

## 2023-04-17 MED ORDER — VASOPRESSIN 20 UNIT/ML IV SOLN
INTRAVENOUS | Status: AC
  Filled 2023-04-17: qty 1

## 2023-04-17 MED ORDER — LIDOCAINE-EPINEPHRINE 1 %-1:100000 IJ SOLN
INTRAMUSCULAR | Status: DC | PRN
  Administered 2023-04-17: 6 mL

## 2023-04-17 MED ORDER — PROPOFOL 500 MG/50ML IV EMUL
INTRAVENOUS | Status: DC | PRN
  Administered 2023-04-17: 50 ug/kg/min via INTRAVENOUS

## 2023-04-17 MED ORDER — CHLORHEXIDINE GLUCONATE CLOTH 2 % EX PADS: 6.0000 | MEDICATED_PAD | Freq: Every day | CUTANEOUS | Status: DC

## 2023-04-17 MED ORDER — VITAMIN B-12 1000 MCG PO TABS
1000.0000 ug | ORAL_TABLET | Freq: Every day | ORAL | Status: DC
  Administered 2023-04-18 – 2023-04-23 (×6): 1000 ug via ORAL
  Filled 2023-04-17 (×6): qty 1

## 2023-04-17 MED ORDER — VITAMIN B-6 100 MG PO TABS
100.0000 mg | ORAL_TABLET | Freq: Every day | ORAL | Status: DC
  Administered 2023-04-18 – 2023-04-23 (×6): 100 mg via ORAL
  Filled 2023-04-17 (×6): qty 1

## 2023-04-17 MED ORDER — MIDAZOLAM HCL 2 MG/2ML IJ SOLN
INTRAMUSCULAR | Status: AC
  Filled 2023-04-17: qty 2

## 2023-04-17 MED ORDER — FENTANYL CITRATE (PF) 100 MCG/2ML IJ SOLN
INTRAMUSCULAR | Status: DC | PRN
  Administered 2023-04-17: 50 ug via INTRAVENOUS

## 2023-04-17 MED ORDER — ALBUTEROL SULFATE HFA 108 (90 BASE) MCG/ACT IN AERS: 2.0000 | INHALATION_SPRAY | Freq: Four times a day (QID) | RESPIRATORY_TRACT | Status: DC | PRN

## 2023-04-17 MED ORDER — VALSARTAN-HYDROCHLOROTHIAZIDE 320-12.5 MG PO TABS: 1.0000 | ORAL_TABLET | Freq: Every day | ORAL | Status: DC

## 2023-04-17 MED ORDER — ADULT MULTIVITAMIN W/MINERALS CH
1.0000 | ORAL_TABLET | Freq: Every day | ORAL | Status: DC
  Administered 2023-04-18 – 2023-04-23 (×6): 1 via ORAL
  Filled 2023-04-17 (×6): qty 1

## 2023-04-17 MED ORDER — DULOXETINE HCL 30 MG PO CPEP: 30.0000 mg | ORAL_CAPSULE | Freq: Every day | ORAL | Status: DC

## 2023-04-17 MED ORDER — LACTATED RINGERS IV BOLUS
1000.0000 mL | Freq: Once | INTRAVENOUS | Status: AC
  Administered 2023-04-17: 1000 mL via INTRAVENOUS

## 2023-04-17 MED ORDER — HYDROCHLOROTHIAZIDE 12.5 MG PO TABS
12.5000 mg | ORAL_TABLET | Freq: Every day | ORAL | Status: DC
  Administered 2023-04-18 – 2023-04-20 (×3): 12.5 mg via ORAL
  Filled 2023-04-17 (×3): qty 1

## 2023-04-17 MED ORDER — PROPOFOL 10 MG/ML IV BOLUS
INTRAVENOUS | Status: AC
  Filled 2023-04-17: qty 20

## 2023-04-17 MED ORDER — 0.9 % SODIUM CHLORIDE (POUR BTL) OPTIME
TOPICAL | Status: DC | PRN
  Administered 2023-04-17: 2000 mL

## 2023-04-17 MED ORDER — SUGAMMADEX SODIUM 200 MG/2ML IV SOLN
INTRAVENOUS | Status: DC | PRN
  Administered 2023-04-17: 250 mg via INTRAVENOUS

## 2023-04-17 MED ORDER — LIDOCAINE HCL (CARDIAC) PF 50 MG/5ML IV SOSY
PREFILLED_SYRINGE | INTRAVENOUS | Status: DC | PRN
  Administered 2023-04-17: 60 mg via INTRAVENOUS

## 2023-04-17 SURGICAL SUPPLY — 55 items
ADH SKN CLS APL DERMABOND .7 (GAUZE/BANDAGES/DRESSINGS) ×1
BAG COUNTER SPONGE SURGICOUNT (BAG) ×1 IMPLANT
BAG DRN CSF CATH SYS STRL MNTR (MISCELLANEOUS) ×1
BAG SPNG CNTER NS LX DISP (BAG) ×1
BLADE CLIPPER SURG (BLADE) IMPLANT
BLADE SURG 11 STRL SS (BLADE) ×1 IMPLANT
BNDG COHESIVE 4X5 TAN STRL (GAUZE/BANDAGES/DRESSINGS) IMPLANT
BNDG GAUZE DERMACEA FLUFF 4 (GAUZE/BANDAGES/DRESSINGS) IMPLANT
BNDG GZE DERMACEA 4 6PLY (GAUZE/BANDAGES/DRESSINGS)
BUR ACORN 9.0 PRECISION (BURR) ×1 IMPLANT
CANISTER SUCT 3000ML PPV (MISCELLANEOUS) ×1 IMPLANT
CATH VENTRIC 35X38 W/TROCAR LG (CATHETERS) IMPLANT
CLIP TI MEDIUM 6 (CLIP) IMPLANT
DERMABOND ADVANCED .7 DNX12 (GAUZE/BANDAGES/DRESSINGS) ×1 IMPLANT
DRAPE NEUROLOGICAL W/INCISE (DRAPES) IMPLANT
DRAPE SURG 17X23 STRL (DRAPES) IMPLANT
DRAPE WARM FLUID 44X44 (DRAPES) ×1 IMPLANT
DRSG OPSITE POSTOP 3X4 (GAUZE/BANDAGES/DRESSINGS) IMPLANT
ELECT REM PT RETURN 9FT ADLT (ELECTROSURGICAL) ×1
ELECTRODE REM PT RTRN 9FT ADLT (ELECTROSURGICAL) ×1 IMPLANT
GAUZE 4X4 16PLY ~~LOC~~+RFID DBL (SPONGE) IMPLANT
GAUZE SPONGE 4X4 12PLY STRL (GAUZE/BANDAGES/DRESSINGS) IMPLANT
GLOVE BIO SURGEON STRL SZ7 (GLOVE) IMPLANT
GLOVE BIO SURGEON STRL SZ8 (GLOVE) ×1 IMPLANT
GLOVE BIOGEL PI IND STRL 7.0 (GLOVE) IMPLANT
GLOVE EXAM NITRILE XL STR (GLOVE) IMPLANT
GLOVE INDICATOR 8.5 STRL (GLOVE) ×2 IMPLANT
GOWN STRL REUS W/ TWL LRG LVL3 (GOWN DISPOSABLE) ×1 IMPLANT
GOWN STRL REUS W/ TWL XL LVL3 (GOWN DISPOSABLE) ×1 IMPLANT
GOWN STRL REUS W/TWL 2XL LVL3 (GOWN DISPOSABLE) ×1 IMPLANT
GOWN STRL REUS W/TWL LRG LVL3 (GOWN DISPOSABLE) ×1
GOWN STRL REUS W/TWL XL LVL3 (GOWN DISPOSABLE) ×1
HOOK DURA (MISCELLANEOUS) ×1 IMPLANT
KIT BASIN OR (CUSTOM PROCEDURE TRAY) ×1 IMPLANT
KIT TURNOVER KIT B (KITS) ×1 IMPLANT
NDL HYPO 25X1 1.5 SAFETY (NEEDLE) ×1 IMPLANT
NEEDLE HYPO 25X1 1.5 SAFETY (NEEDLE) ×1 IMPLANT
NS IRRIG 1000ML POUR BTL (IV SOLUTION) ×1 IMPLANT
PACK CRANIOTOMY CUSTOM (CUSTOM PROCEDURE TRAY) ×1 IMPLANT
PAD ARMBOARD 7.5X6 YLW CONV (MISCELLANEOUS) ×3 IMPLANT
PATTIES SURGICAL .25X.25 (GAUZE/BANDAGES/DRESSINGS) IMPLANT
PATTIES SURGICAL .5 X.5 (GAUZE/BANDAGES/DRESSINGS) IMPLANT
PATTIES SURGICAL .5 X3 (DISPOSABLE) IMPLANT
PATTIES SURGICAL 1X1 (DISPOSABLE) IMPLANT
SPIKE FLUID TRANSFER (MISCELLANEOUS) ×1 IMPLANT
SPONGE NEURO XRAY DETECT 1X3 (DISPOSABLE) IMPLANT
SPONGE SURGIFOAM ABS GEL 100 (HEMOSTASIS) IMPLANT
STAPLER VISISTAT 35W (STAPLE) ×1 IMPLANT
SUT NURALON 4 0 TR CR/8 (SUTURE) ×2 IMPLANT
SUT VIC AB 2-0 CT1 18 (SUTURE) ×1 IMPLANT
SYSTEM CSF EXTERNAL DRAINAGE (MISCELLANEOUS) IMPLANT
TOWEL GREEN STERILE (TOWEL DISPOSABLE) ×1 IMPLANT
TOWEL GREEN STERILE FF (TOWEL DISPOSABLE) ×1 IMPLANT
TRAY FOLEY MTR SLVR 16FR STAT (SET/KITS/TRAYS/PACK) IMPLANT
WATER STERILE IRR 1000ML POUR (IV SOLUTION) ×1 IMPLANT

## 2023-04-17 NOTE — ED Notes (Signed)
Spoke with Selena Batten at [facility] , regarding transport for patient ED to ED.

## 2023-04-17 NOTE — ED Provider Notes (Signed)
Patient arrives as transfer from [facility] for urgent NSGY evaluation - consult placed at 3:10 pm.  Patient assessed on arrival, mentating well, no headache, reported dizziness and frequent falls worsening the past few days.    No vomiting.  BP 130 systolic.  HR wnl.  Clinical Course as of 04/17/23 1622  Sun Apr 17, 2023  1526 Spoke to Selena Batten from [employer] [[place]]    Clinical Course User Index [MT] Renaye Rakers Kermit Balo, MD   Admitted by NSGY service   Terald Sleeper, MD 04/17/23 [id number]

## 2023-04-17 NOTE — Transfer of Care (Signed)
Immediate Anesthesia Transfer of Care Note  Patient: Rodney [name]  Procedure(s) Performed: [name] HOLES FOR EVACUATION OF SUBDURAL HEMATOMA (Head)  Patient Location: PACU  Anesthesia Type:General  Level of Consciousness: awake, alert , and oriented  Airway & Oxygen Therapy: Patient Spontanous Breathing and Patient connected to face mask oxygen  Post-op Assessment: Report given to RN, Post -op Vital signs reviewed and stable, and Patient moving all extremities X 4  Post vital signs: Reviewed and stable  Last Vitals:  Vitals Value Taken Time  BP 140/71 04/17/23 2306  Temp    Pulse 74 04/17/23 2307  Resp 11 04/17/23 2307  SpO2 94 % 04/17/23 2307  Vitals shown include unfiled device data.  Last Pain:  Vitals:   04/17/23 2000  TempSrc: Oral  PainSc: 0-No pain         Complications: No notable events documented.

## 2023-04-17 NOTE — ED Triage Notes (Signed)
Patient here POV from Home.  Endorses Falling 3 Times yesterday. States he became weak and fell. This has been occurring over the past few months and is in the process of being seen by multiple specialists including neurology and ENT.   No Anticoagulants. No Head injury or LOC. Notes Dizziness today as well as Right Arm and Back pain.   NAD Noted during triage. A&Ox4. Gcs 15. Ambulatory with Cane.

## 2023-04-17 NOTE — Anesthesia Procedure Notes (Signed)
Procedure Name: Intubation Date/Time: 04/17/2023 9:57 PM  Performed by: Edmonia Caprio, CRNAPre-anesthesia Checklist: Timeout performed, Patient being monitored, Suction available, Emergency Drugs available and Patient identified Patient Re-evaluated:Patient Re-evaluated prior to induction Oxygen Delivery Method: Circle system utilized Preoxygenation: Pre-oxygenation with 100% oxygen Induction Type: IV induction Ventilation: Mask ventilation without difficulty and Oral airway inserted - appropriate to patient size Laryngoscope Size: Glidescope and 3 Grade View: Grade I Tube size: 7.5 mm Number of attempts: 1 Airway Equipment and Method: Stylet and Video-laryngoscopy Placement Confirmation: ETT inserted through vocal cords under direct vision, positive ETCO2 and breath sounds checked- equal and bilateral Secured at: 23 cm Tube secured with: Tape Dental Injury: Teeth and Oropharynx as per pre-operative assessment
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
## 2023-04-17 NOTE — ED Provider Notes (Signed)
[facility] AT [facility] Provider Note   CSN: [id number] Arrival date & time: 04/17/23  1139     History  Chief Complaint  Patient presents with   Rodney [name] is a 77 y.o. male.  77 year old male presents with dizziness x 4 months.  He has been referred by his doctor for vestibular repositioning but has deferred.  He has had increasing falls.  Denies any new peripheral weakness.  Does have a cane as well as a walker at home.  Has not been using his walker.  Has had no new shortness of breath or abdominal discomfort.  States he fell 3 times last 24 hours.  No LOC with it.  Distract his right side of his chest and have some pain there.  Patient also notes skin tears to his right arm as well.  Patient on review of his old chart has a known right sided subdural hematoma.  Was last seen by neurosurgery a few weeks ago and had a CAT scan done at that time.  He denies any headache or emesis.  No new recent medication changes       Home Medications Prior to Admission medications   Medication Sig Start Date End Date Taking? Authorizing Provider  albuterol (VENTOLIN HFA) 108 (90 Base) MCG/ACT inhaler Inhale 2 puffs into the lungs every 6 (six) hours as needed for wheezing or shortness of breath. 10/06/22   Kalman Shan, MD  ALPRAZolam Prudy Feeler) 0.25 MG tablet Take 0.25 mg by mouth daily. Patient not taking: Reported on 03/23/2023    [provider]  amLODipine (NORVASC) 10 MG tablet Take 10 mg by mouth at bedtime.    [provider]  aspirin EC 81 MG tablet Take 162 mg by mouth at bedtime.    [provider]  B Complex-C (B-COMPLEX WITH VITAMIN C) tablet Take 1 tablet by mouth daily.    [provider]  Bempedoic Acid (NEXLETOL) 180 MG TABS Take 1 tablet (180 mg total) by mouth daily. 01/24/23   Hilty, Lisette Abu, MD  cholecalciferol (VITAMIN D) 1000 UNITS tablet Take 1,000 Units by mouth daily.    [provider]  Cinnamon 500 MG TABS Take 1,000 mg by mouth in the morning and at bedtime.    [provider]  Coenzyme Q10 (COQ10) 100 MG CAPS Take 100 mg by mouth daily.    [provider]  cyanocobalamin (VITAMIN B12) 1000 MCG tablet Take 1,000 mcg by mouth daily.    [provider]  cyclobenzaprine (FLEXERIL) 10 MG tablet TAKE 1 TABLET BY MOUTH THREE TIMES A DAY AS NEEDED FOR MUSCLE SPASMS 08/28/19   Kathryne Hitch, MD  DULoxetine (CYMBALTA) 30 MG capsule Take 30 mg by mouth daily. 12/17/22   [provider]  DULoxetine (CYMBALTA) 60 MG capsule Take 60 mg by mouth at bedtime. 08/11/15   [provider]  ezetimibe (ZETIA) 10 MG tablet TAKE 1 TABLET BY MOUTH EVERY DAY 04/01/23   Corky Crafts, MD  finasteride (PROSCAR) 5 MG tablet Take 5 mg by mouth daily. 12/12/20   [provider]  Fluticasone-Umeclidin-Vilant (TRELEGY ELLIPTA) 100-62.5-25 MCG/ACT AEPB Inhale 1 each into the lungs daily. 03/23/23   Kalman Shan, MD  LORazepam (ATIVAN) 0.5 MG tablet Take 0.5 mg by mouth as needed for anxiety. 05/14/20   [provider]  Magnesium 500 MG TABS Take 1 tablet by mouth every other day.    [provider]  metoprolol succinate (TOPROL-XL) 50 MG 24 hr tablet Take 50 mg by mouth daily. 09/12/22   [provider]  Milk Thistle 500 MG CAPS Take 1 capsule by mouth 2 (two) times daily.    [provider]  Multiple Vitamin (MULTIVITAMIN WITH MINERALS) TABS tablet Take 1 tablet by mouth daily.    [provider]  niacinamide 500 MG tablet Take 500 mg by mouth 2 (two) times daily with a meal.    [provider]  pantoprazole (PROTONIX) 40 MG tablet Take 40 mg by mouth See admin instructions. DAILY EXCEPT Tuesday & Thursday.    [provider]  Potassium 99 MG TABS Take 1 tablet by mouth daily.    [provider]  pravastatin (PRAVACHOL) 80 MG tablet Take 1 tablet (80 mg  total) by mouth every evening. 04/16/22   Corky Crafts, MD  Probiotic Product (PROBIOTIC DAILY PO) Take 1 tablet by mouth daily.    [provider]  pyridOXINE (VITAMIN B6) 100 MG tablet Take 100 mg by mouth daily.    [provider]  spironolactone (ALDACTONE) 25 MG tablet Take 25 mg by mouth daily.    [provider]  tamsulosin (FLOMAX) 0.4 MG CAPS capsule Take 0.4 mg by mouth daily.  11/07/16   [provider]  TRELEGY ELLIPTA 100-62.5-25 MCG/ACT AEPB TAKE 1 PUFF BY MOUTH EVERY DAY 01/17/23   Kalman Shan, MD  valsartan-hydrochlorothiazide (DIOVAN-HCT) 320-12.5 MG per tablet Take 1 tablet by mouth daily.    [provider]      Allergies    Ace inhibitors and Rosuvastatin calcium    Review of Systems   Review of Systems  All other systems reviewed and are negative.   Physical Exam Updated Vital Signs BP 91/61 (BP Location: Right Arm)   Pulse 80   Temp 98 F (36.7 C) (Oral)   Resp 18   Ht 1.803 m (5\' 11" )   Wt 104.3 kg   SpO2 92%   BMI 32.08 kg/m  Physical Exam Vitals and nursing note reviewed.  Constitutional:      General: He is not in acute distress.    Appearance: Normal appearance. He is well-developed. He is not toxic-appearing.  HENT:     Head: Normocephalic and atraumatic.  Eyes:     General: Lids are normal.     Conjunctiva/sclera: Conjunctivae normal.     Pupils: Pupils are equal, round, and reactive to light.  Neck:     Thyroid: No thyroid mass.     Trachea: No tracheal deviation.  Cardiovascular:     Rate and Rhythm: Normal rate and regular rhythm.     Heart sounds: Normal heart sounds. No murmur heard.    No gallop.  Pulmonary:     Effort: Pulmonary effort is normal. No respiratory distress.     Breath sounds: Normal breath sounds. No stridor. No decreased breath sounds, wheezing, rhonchi or rales.  Chest:     Chest wall: Tenderness present.    Abdominal:     General: There is no distension.      Palpations: Abdomen is soft.     Tenderness: There is no abdominal tenderness. There is no rebound.  Musculoskeletal:        General: No tenderness. Normal range of motion.     Cervical back: Normal range of motion and neck supple.  Skin:    General: Skin is warm and dry.     Findings: No abrasion or rash.  Neurological:  General: No focal deficit present.     Mental Status: He is alert and oriented to person, place, and time. Mental status is at baseline.     GCS: GCS eye subscore is 4. GCS verbal subscore is 5. GCS motor subscore is 6.     Cranial Nerves: Cranial nerves are intact. No cranial nerve deficit.     Sensory: No sensory deficit.     Motor: Motor function is intact.  Psychiatric:        Attention and Perception: Attention normal.        Speech: Speech normal.        Behavior: Behavior normal.     ED Results / Procedures / Treatments   Labs (all labs ordered are listed, but only abnormal results are displayed) Labs Reviewed  BASIC METABOLIC PANEL - Abnormal; Notable for the following components:      Result Value   Chloride 96 (*)    Glucose, Bld 107 (*)    BUN 26 (*)    Creatinine, Ser 1.42 (*)    GFR, Estimated 51 (*)    All other components within normal limits  CBC - Abnormal; Notable for the following components:   WBC 10.7 (*)    MCV 102.1 (*)    MCH 34.9 (*)    All other components within normal limits    EKG EKG Interpretation Date/Time:  Sunday April 17 2023 12:00:49 EDT Ventricular Rate:  79 PR Interval:  210 QRS Duration:  136 QT Interval:  400 QTC Calculation: 458 R Axis:   -54  Text Interpretation: Sinus rhythm with 1st degree A-V block Right bundle branch block Left anterior fascicular block Minimal voltage criteria for LVH, may be normal variant ( R in aVL ) Septal infarct , age undetermined Abnormal ECG When compared with ECG of 01-Dec-2016 15:51, (RBBB and left anterior fascicular block) is now Present Septal infarct is now Present  Confirmed by Lorre Nick ([id number]) on 04/17/2023 12:45:19 PM  Radiology No results found.  Procedures Procedures    Medications Ordered in ED Medications  lactated ringers infusion (has no administration in time range)  lactated ringers bolus 1,000 mL (has no administration in time range)    ED Course/ Medical Decision Making/ A&P                             Medical Decision Making Amount and/or Complexity of Data Reviewed Labs: ordered. Radiology: ordered.  Risk Prescription drug management.  Treat with IV fluids due to slight increase of his creatinine. Patient is EKG per interpretation shows bifascicular block which is new compared to prior. Here with increased dizziness and falls with known history of subdural hematoma.  Head CT shows worsening subdural hematoma with midline shift.  Patient also has a right-sided rib fracture from the fall without evidence of pneumothorax, interpretation on his chest x-ray.  Will only need symptomatic treatment for this.  Discussed with Dr. Wynetta Emery from neurosurgery concerning patient's increased bleeding and recommend patient be sent to [facility].  Discussed with EDP there, Dr. Gayleen Orem, and patient be transferred via [facility]  CRITICAL CARE Performed by: Toy Baker Total critical care time: 50 minutes Critical care time was exclusive of separately billable procedures and treating other patients. Critical care was necessary to treat or prevent imminent or life-threatening deterioration. Critical care was time spent personally by me on the following activities: development of treatment plan with patient and/or surrogate  as well as nursing, discussions with consultants, evaluation of patient's response to treatment, examination of patient, obtaining history from patient or surrogate, ordering and performing treatments and interventions, ordering and review of laboratory studies, ordering and review of radiographic studies, pulse  oximetry and re-evaluation of patient's condition.         Final Clinical Impression(s) / ED Diagnoses Final diagnoses:  None    Rx / DC Orders ED Discharge Orders     None         Lorre Nick, MD 04/17/23 1357

## 2023-04-17 NOTE — Op Note (Signed)
Preoperative diagnosis: Subacute on chronic subdural hematoma left.  Postoperative diagnosis: Same.  Procedure: Bur hole craniectomy for evacuation of subacute subdural hematoma.  Left.  Surgeon: Donalee Citrin.  Assistant: Julien Girt.  Anesthesia: General.  EBL: Minimal.  HPI: 77 year old gentleman has had multiple falls and is had a known subdural this been followed however presented with increased falls altered mental status and a CT scan showing significant expansion of the left subacute subdural hematoma.  Due to patient's progression of clinical syndrome imaging findings of a conservative treatment I recommended bur hole craniectomy for evacuation.  I extensively reviewed the risks and benefits of the operation with him as well as perioperative course expectations of outcome and alternatives of surgery and he understood and agreed to proceed forward.  Operative procedure: Patient was brought into the OR was induced under general anesthesia positioned supine shoulder bump under his left shoulder head turned the right left side of his head was shaved prepped and draped in routine sterile fashion.  2 incision was drawn out initially in the superior temporal line 1 frontal 1 parietal and after timeout was achieved my assistant I we incised both incisions 2 bur holes were drilled the dura was identified it was coagulated was incised in a cruciate fashion and an extensive mount of dark old blood came out under pressure we copiously irrigated through 1 Burrell to the other area utilizing a red rubber catheter and continued and performed continuous irrigation until the irrigant being expressed was clear I then passed a ventricular catheter from the parietal bur hole to the frontal bur hole in the subdural space.  We then copiously irrigated the bur holes and closed the parietal bur hole irrigated through the frontal put Gelfoam and top frontal and closed this primarily.  The galea was closed with 2-0  Vicryl skin was closed with staples the drain was sewn in and patient was remitted to recovery in stable condition.  At the end the case all needle count sponge counts were correct.

## 2023-04-17 NOTE — Anesthesia Preprocedure Evaluation (Addendum)
Anesthesia Evaluation  Patient identified by MRN, date of birth, ID band Patient awake    Reviewed: Allergy & Precautions, NPO status , Patient's Chart, lab work & pertinent test results, reviewed documented beta blocker date and time   History of Anesthesia Complications Negative for: history of anesthetic complications  Airway Mallampati: III  TM Distance: >3 FB Neck ROM: Full    Dental  (+) Dental Advisory Given, Teeth Intact   Pulmonary sleep apnea , COPD,  COPD inhaler, Current Smoker and Patient abstained from smoking.   Pulmonary exam normal        Cardiovascular hypertension, Pt. on medications and Pt. on home beta blockers Normal cardiovascular exam   '23 Coronary CTA - Coronary CTA FFR analysis demonstrates no significant flow limiting lesions.  '22 TTE - EF 50 to 55%. There is mild concentric left ventricular hypertrophy. Grade I diastolic dysfunction (impaired relaxation). Trivial mitral valve regurgitation. Aortic valve regurgitation is mild. Mild aortic valve stenosis. There is mild dilatation of the aortic root, measuring 38 mm. There is mild dilatation of the ascending aorta, measuring 37 mm.     Neuro/Psych  PSYCHIATRIC DISORDERS Anxiety Depression     CT Head - Increased size of the mixed attenuation left subdural hematoma with new internal hyperdensity, now measuring up to 26 mm in thickness, previously 16 mm. Increased rightward midline shift, now measuring up to 9 mm.  TIA Neuromuscular disease    GI/Hepatic Neg liver ROS, hiatal hernia,GERD  Medicated and Controlled,,  Endo/Other  negative endocrine ROS    Renal/GU negative Renal ROS     Musculoskeletal  (+) Arthritis ,    Abdominal   Peds  Hematology negative hematology ROS (+)   Anesthesia Other Findings   Reproductive/Obstetrics                             Anesthesia Physical Anesthesia Plan  ASA: 3  Anesthesia  Plan: General   Post-op Pain Management: Tylenol PO (pre-op)*   Induction: Intravenous  PONV Risk Score and Plan: 1 and Treatment may vary due to age or medical condition, Ondansetron and TIVA  Airway Management Planned: Oral ETT  Additional Equipment: None  Intra-op Plan:   Post-operative Plan: Extubation in OR  Informed Consent: I have reviewed the patients History and Physical, chart, labs and discussed the procedure including the risks, benefits and alternatives for the proposed anesthesia with the patient or authorized representative who has indicated his/her understanding and acceptance.     Dental advisory given  Plan Discussed with: CRNA and Anesthesiologist  Anesthesia Plan Comments:         Anesthesia Quick Evaluation

## 2023-04-18 ENCOUNTER — Encounter (HOSPITAL_COMMUNITY): Payer: Self-pay | Admitting: Neurosurgery

## 2023-04-18 ENCOUNTER — Ambulatory Visit: Payer: Medicare Other | Admitting: Orthopaedic Surgery

## 2023-04-18 ENCOUNTER — Inpatient Hospital Stay (HOSPITAL_COMMUNITY): Payer: Medicare Other

## 2023-04-18 DIAGNOSIS — S065XAA Traumatic subdural hemorrhage with loss of consciousness status unknown, initial encounter: Secondary | ICD-10-CM | POA: Diagnosis present

## 2023-04-18 MED ORDER — SODIUM CHLORIDE 0.9 % IV SOLN: INTRAVENOUS | Status: DC

## 2023-04-18 MED ORDER — MORPHINE SULFATE (PF) 2 MG/ML IV SOLN: 1.0000 mg | INTRAVENOUS | Status: DC | PRN

## 2023-04-18 MED ORDER — PROMETHAZINE HCL 12.5 MG PO TABS: 12.5000 mg | ORAL_TABLET | ORAL | Status: DC | PRN

## 2023-04-18 MED ORDER — CEFAZOLIN SODIUM-DEXTROSE 1-4 GM/50ML-% IV SOLN
1.0000 g | Freq: Three times a day (TID) | INTRAVENOUS | Status: AC
  Administered 2023-04-18 (×2): 1 g via INTRAVENOUS
  Filled 2023-04-18 (×2): qty 50

## 2023-04-18 MED ORDER — FLUTICASONE FUROATE-VILANTEROL 100-25 MCG/ACT IN AEPB
1.0000 | INHALATION_SPRAY | Freq: Every day | RESPIRATORY_TRACT | Status: DC
  Administered 2023-04-18 – 2023-04-23 (×6): 1 via RESPIRATORY_TRACT
  Filled 2023-04-18: qty 28

## 2023-04-18 MED ORDER — ORAL CARE MOUTH RINSE: 15.0000 mL | OROMUCOSAL | Status: DC | PRN

## 2023-04-18 MED ORDER — UMECLIDINIUM BROMIDE 62.5 MCG/ACT IN AEPB
1.0000 | INHALATION_SPRAY | Freq: Every day | RESPIRATORY_TRACT | Status: DC
  Administered 2023-04-18 – 2023-04-23 (×6): 1 via RESPIRATORY_TRACT
  Filled 2023-04-18: qty 7

## 2023-04-18 MED ORDER — ONDANSETRON HCL 4 MG/2ML IJ SOLN: 4.0000 mg | INTRAMUSCULAR | Status: DC | PRN

## 2023-04-18 MED ORDER — PANTOPRAZOLE SODIUM 40 MG IV SOLR
40.0000 mg | Freq: Every day | INTRAVENOUS | Status: DC
  Administered 2023-04-18: 40 mg via INTRAVENOUS
  Filled 2023-04-18: qty 10

## 2023-04-18 MED ORDER — HYDROCODONE-ACETAMINOPHEN 5-325 MG PO TABS
1.0000 | ORAL_TABLET | ORAL | Status: DC | PRN
  Administered 2023-04-18 – 2023-04-22 (×12): 1 via ORAL
  Filled 2023-04-18 (×15): qty 1

## 2023-04-18 MED ORDER — LEVETIRACETAM IN NACL 500 MG/100ML IV SOLN
500.0000 mg | Freq: Two times a day (BID) | INTRAVENOUS | Status: DC
  Administered 2023-04-18 (×3): 500 mg via INTRAVENOUS
  Filled 2023-04-18 (×4): qty 100

## 2023-04-18 MED ORDER — ACETAMINOPHEN 325 MG PO TABS
650.0000 mg | ORAL_TABLET | ORAL | Status: DC | PRN
  Administered 2023-04-18 – 2023-04-21 (×3): 650 mg via ORAL
  Filled 2023-04-18 (×3): qty 2

## 2023-04-18 MED ORDER — LABETALOL HCL 5 MG/ML IV SOLN: 10.0000 mg | INTRAVENOUS | Status: DC | PRN

## 2023-04-18 MED ORDER — ACETAMINOPHEN 650 MG RE SUPP: 650.0000 mg | RECTAL | Status: DC | PRN

## 2023-04-18 MED ORDER — SENNA 8.6 MG PO TABS
1.0000 | ORAL_TABLET | Freq: Two times a day (BID) | ORAL | Status: DC
  Administered 2023-04-18 – 2023-04-23 (×10): 8.6 mg via ORAL
  Filled 2023-04-18 (×11): qty 1

## 2023-04-18 MED ORDER — ONDANSETRON HCL 4 MG PO TABS: 4.0000 mg | ORAL_TABLET | ORAL | Status: DC | PRN

## 2023-04-18 NOTE — Evaluation (Signed)
Occupational Therapy Evaluation Patient Details Name: Rodney [name] BOISSONNEAULT MRN: [id number] DOB: 1946/04/29 Today's Date: 04/18/2023   History of Present Illness Pt is a 77 y.o. M who presents 04/17/2023 with increased frequency of falls and dizziness.  CT head shows increase in size of left sided SDH now measuring 26mm in size compared to 16mm on his last head CT a couple weeks ago. He also has increased midline shift now. S/p [name] hole craniectomy for evacuation of subacute subdural hematoma 7/21. Significant PMH: COPD, TIA, R THA, L SDH.   Clinical Impression   PTA pt lives independently with his wife. Pt states he has had multiple falls recently, resulting in trauma to his R arm and L broken rib in addition to SDH.  Pt currently requires minguard to min A with mobility and ADL tasks due to below listed deficits. Acute OT will follow to facilitate safe DC home with HHOT. VSS on RA during session.     Recommendations for follow up therapy are one component of a multi-disciplinary discharge planning process, led by the attending physician.  Recommendations may be updated based on patient status, additional functional criteria and insurance authorization.   Assistance Recommended at Discharge Frequent or constant Supervision/Assistance  Patient can return home with the following Direct supervision/assist for medications management;Direct supervision/assist for financial management;Assist for transportation;Assistance with cooking/housework    Functional Status Assessment  Patient has had a recent decline in their functional status and demonstrates the ability to make significant improvements in function in a reasonable and predictable amount of time.  Equipment Recommendations  Tub/shower seat    Recommendations for Other Services       Precautions / Restrictions Precautions Precautions: Fall;Other (comment) Precaution Comments: burr hole drainage bag Restrictions Weight Bearing  Restrictions: No      Mobility Bed Mobility Overal bed mobility: Needs Assistance Bed Mobility: Supine to Sit     Supine to sit: Supervision     General bed mobility comments: Supervision for line management    Transfers Overall transfer level: Needs assistance Equipment used: None Transfers: Sit to/from Stand Sit to Stand: Min guard                  Balance Overall balance assessment: Mild deficits observed, not formally tested, Needs assistance, History of Falls   Sitting balance-Leahy Scale: Good       Standing balance-Leahy Scale: Fair                             ADL either performed or assessed with clinical judgement   ADL Overall ADL's : Needs assistance/impaired     Grooming: Set up   Upper Body Bathing: Set up;Sitting   Lower Body Bathing: Supervison/ safety;Sit to/from stand   Upper Body Dressing : Set up;Sitting   Lower Body Dressing: Supervision/safety;Sit to/from stand   Toilet Transfer: Min guard;Ambulation   Toileting- Clothing Manipulation and Hygiene: Minimal assistance;Sit to/from stand       Functional mobility during ADLs: Min guard;Cueing for safety       Vision Baseline Vision/History: 1 Wears glasses Patient Visual Report: Blurring of vision Vision Assessment?: Vision impaired- to be further tested in functional context;Yes Eye Alignment: Within Functional Limits Ocular Range of Motion: Within Functional Limits Alignment/Gaze Preference: Within Defined Limits Tracking/Visual Pursuits: Decreased smoothness of horizontal tracking;Decreased smoothness of vertical tracking Saccades: Additional eye shifts occurred during testing;Additional head turns occurred during testing Convergence: Within functional limits Visual Fields:  No apparent deficits Additional Comments: decreased visual attention; increased eye movements when searching for targets     Perception     Praxis      Pertinent Vitals/Pain Pain  Assessment Pain Assessment: Faces Faces Pain Scale: Hurts a little bit Pain Location: headache (R rib pain) Pain Descriptors / Indicators: Headache Pain Intervention(s): Limited activity within patient's tolerance     Hand Dominance Right   Extremity/Trunk Assessment Upper Extremity Assessment Upper Extremity Assessment: Overall WFL for tasks assessed   Lower Extremity Assessment Lower Extremity Assessment: Defer to PT evaluation RLE Sensation: history of peripheral neuropathy   Cervical / Trunk Assessment Cervical / Trunk Assessment: Normal   Communication Communication Communication: No difficulties   Cognition Arousal/Alertness: Awake/alert Behavior During Therapy: WFL for tasks assessed/performed Overall Cognitive Status: Impaired/Different from baseline Area of Impairment: Attention, Memory, Safety/judgement, Awareness                   Current Attention Level: Selective Memory: Decreased short-term memory   Safety/Judgement: Decreased awareness of safety, Decreased awareness of deficits Awareness: Emergent   General Comments: Pt pleasant and agreeable to participate. A&Ox4. Assessed with the Short Blessed Test - scored a 4 which places him in the "normal" category (0-4). He was unable to state the mohts of the year in reverse order. He had significnat difficulty with sequential subtraction of "3"s. States he  had experienced not being able to "get out the right words"     General Comments  VSS    Exercises     Shoulder Instructions      Home Living Family/patient expects to be discharged to:: Private residence Living Arrangements: Spouse/significant other Available Help at Discharge: Family;Available 24 hours/day Type of Home: House Home Access: Level entry     Home Layout: One level     Bathroom Shower/Tub: Chief Strategy Officer: Standard Bathroom Accessibility: Yes How Accessible: Accessible via walker Home Equipment: Grab bars  - tub/shower;Other (comment);Rolling Walker (2 wheels);Cane - single point;BSC/3in1 (tub mat)          Prior Functioning/Environment Prior Level of Function : Independent/Modified Independent             Mobility Comments: using cane in past couple of weeks with increased dizziness. reports 10 falls since January (3 this past Sunday). Was able to do floor transfer if chair was present          OT Problem List: Decreased activity tolerance;Impaired balance (sitting and/or standing);Decreased cognition;Decreased safety awareness      OT Treatment/Interventions: Self-care/ADL training;Therapeutic exercise;DME and/or AE instruction;Therapeutic activities;Cognitive remediation/compensation;Visual/perceptual remediation/compensation;Patient/family education;Balance training    OT Goals(Current goals can be found in the care plan section) Acute Rehab OT Goals Patient Stated Goal: to get back to playing golf OT Goal Formulation: With patient Time For Goal Achievement: 05/02/23 Potential to Achieve Goals: Good  OT Frequency: Min 1X/week    Co-evaluation              AM-PAC OT "6 Clicks" Daily Activity     Outcome Measure Help from another person eating meals?: None Help from another person taking care of personal grooming?: A Little Help from another person toileting, which includes using toliet, bedpan, or urinal?: A Little Help from another person bathing (including washing, rinsing, drying)?: A Little Help from another person to put on and taking off regular upper body clothing?: A Little Help from another person to put on and taking off regular lower body clothing?: A Little 6  Click Score: 19   End of Session Equipment Utilized During Treatment: Gait belt Nurse Communication: Mobility status  Activity Tolerance: Patient tolerated treatment well Patient left: in bed;with call bell/phone within reach;with bed alarm set  OT Visit Diagnosis: Unsteadiness on feet  (R26.81);Repeated falls (R29.6);Other symptoms and signs involving cognitive function                Time: 8469-6295 OT Time Calculation (min): 34 min Charges:  OT General Charges $OT Visit: 1 Visit OT Evaluation $OT Eval Moderate Complexity: 1 Mod OT Treatments $Self Care/Home Management : 8-22 mins  Luisa Dago, OT/L   Acute OT Clinical Specialist Acute Rehabilitation Services Pager [phone] Office [phone]   [facility] 04/18/2023, 4:17 PM

## 2023-04-18 NOTE — Evaluation (Signed)
Physical Therapy Evaluation Patient Details Name: Rodney [name] MRN: [id number] DOB: 17-Mar-1946 Today's Date: 04/18/2023  History of Present Illness  Pt is a 77 y.o. M who presents 04/17/2023 with increased frequency of falls and dizziness.  CT head shows increase in size of left sided SDH now measuring 26mm in size compared to 16mm on his last head CT a couple weeks ago. He also has increased midline shift now. S/p [name] hole craniectomy for evacuation of subacute subdural hematoma 7/21. Significant PMH: COPD, TIA, R THA, L SDH.  Clinical Impression  PTA, pt lives with his spouse and is independent with ADL's and mobility using a cane. Reports history of frequent falls, with three occurring this past Sunday. Pt presents with decreased functional mobility secondary to impaired dynamic balance, gait abnormalities, decreased gross motor coordination, and higher level cognitive functions. Pt ambulating 150 ft with no assistive device at a min guard assist level. Recommended continued use of cane for increased external support (pt with preference of cane vs RW due to housing accessibility). Would benefit from [facility] safety evaluation at d/c in setting of recent falls with transition to OPPT when appropriate for further balance, coordination and strength training.       Assistance Recommended at Discharge Frequent or constant Supervision/Assistance  If plan is discharge home, recommend the following:  Can travel by private vehicle  A little help with walking and/or transfers;Assistance with cooking/housework;Direct supervision/assist for medications management;Direct supervision/assist for financial management;Assist for transportation        Equipment Recommendations None recommended by PT  Recommendations for Other Services       Functional Status Assessment Patient has had a recent decline in their functional status and demonstrates the ability to make significant improvements in function in a  reasonable and predictable amount of time.     Precautions / Restrictions Precautions Precautions: Fall;Other (comment) Precaution Comments: Scalp bag drain Restrictions Weight Bearing Restrictions: No      Mobility  Bed Mobility Overal bed mobility: Needs Assistance Bed Mobility: Supine to Sit     Supine to sit: Supervision     General bed mobility comments: Supervision for line management    Transfers Overall transfer level: Needs assistance Equipment used: None Transfers: Sit to/from Stand Sit to Stand: Min guard           General transfer comment: Min guard for safety, good power up from edge of bed    Ambulation/Gait Ambulation/Gait assistance: Min guard, +2 safety/equipment Gait Distance (Feet): 150 Feet Assistive device: None Gait Pattern/deviations: Step-through pattern, Decreased stride length, Drifts right/left Gait velocity: decreased     General Gait Details: Pt demonstrating increased R foot external rotation, mild R knee instability and drift. Min guard due to lateral instability, +2 for equipment  Stairs            Wheelchair Mobility     Tilt Bed    Modified Rankin (Stroke Patients Only)       Balance Overall balance assessment: Mild deficits observed, not formally tested                                           Pertinent Vitals/Pain Pain Assessment Pain Assessment: Faces Faces Pain Scale: Hurts a little bit Pain Location: headache Pain Descriptors / Indicators: Headache Pain Intervention(s): Monitored during session, Premedicated before session    Home Living Family/patient expects to be  discharged to:: Private residence Living Arrangements: Spouse/significant other Available Help at Discharge: Family;Available 24 hours/day Type of Home: House Home Access: Level entry       Home Layout: One level Home Equipment: Grab bars - tub/shower;Other (comment);Rolling Walker (2 wheels);Cane - single  point;BSC/3in1 (tub mat)      Prior Function Prior Level of Function : Independent/Modified Independent             Mobility Comments: using cane in past couple of weeks with increased dizziness. reports 10 falls since January (3 this past Sunday). Was able to do floor transfer if chair was present       Hand Dominance        Extremity/Trunk Assessment   Upper Extremity Assessment Upper Extremity Assessment: Defer to OT evaluation    Lower Extremity Assessment Lower Extremity Assessment: RLE deficits/detail;LLE deficits/detail RLE Deficits / Details: Knee extension 5/5, ankle dorsiflexion 5/5. Heel to shin coordination WNL RLE Sensation: history of peripheral neuropathy RLE Coordination: decreased gross motor LLE Deficits / Details: Knee extension 5/5, ankle dorsiflexion 5/5. + knee crepitus. Heel to shin coordination WNL LLE Coordination: WNL    Cervical / Trunk Assessment Cervical / Trunk Assessment: Normal  Communication   Communication: No difficulties  Cognition Arousal/Alertness: Awake/alert Behavior During Therapy: WFL for tasks assessed/performed Overall Cognitive Status: Impaired/Different from baseline Area of Impairment: Attention, Memory, Safety/judgement, Awareness                   Current Attention Level: Selective Memory: Decreased short-term memory   Safety/Judgement: Decreased awareness of safety, Decreased awareness of deficits Awareness: Emergent   General Comments: Pt pleasant and agreeable to participate. A&Ox4. Demonstrates some emerging awareness, however, overall decreased insight into deficits and safety. Difficulty distinguishing acute vs chronic symptoms.        General Comments General comments (skin integrity, edema, etc.): SPO2 92% on RA, BP 103/78 sitting EOB    Exercises     Assessment/Plan    PT Assessment Patient needs continued PT services  PT Problem List Decreased balance;Decreased mobility;Decreased  coordination;Decreased safety awareness       PT Treatment Interventions DME instruction;Gait training;Functional mobility training;Therapeutic activities;Therapeutic exercise;Balance training;Patient/family education    PT Goals (Current goals can be found in the Care Plan section)  Acute Rehab PT Goals Patient Stated Goal: to go home PT Goal Formulation: With patient Time For Goal Achievement: 05/02/23 Potential to Achieve Goals: Good    Frequency Min 4X/week     Co-evaluation               AM-PAC PT "6 Clicks" Mobility  Outcome Measure Help needed turning from your back to your side while in a flat bed without using bedrails?: None Help needed moving from lying on your back to sitting on the side of a flat bed without using bedrails?: A Little Help needed moving to and from a bed to a chair (including a wheelchair)?: A Little Help needed standing up from a chair using your arms (e.g., wheelchair or bedside chair)?: A Little Help needed to walk in hospital room?: A Little Help needed climbing 3-5 steps with a railing? : A Little 6 Click Score: 19    End of Session Equipment Utilized During Treatment: Gait belt Activity Tolerance: Patient tolerated treatment well Patient left: in chair;with call bell/phone within reach;with chair alarm set Nurse Communication: Mobility status;Other (comment) (left pt on RA) PT Visit Diagnosis: Unsteadiness on feet (R26.81);History of falling (Z91.81)    Time: 1610-9604  PT Time Calculation (min) (ACUTE ONLY): 29 min   Charges:   PT Evaluation $PT Eval Moderate Complexity: 1 Mod PT Treatments $Therapeutic Activity: 8-22 mins PT General Charges $$ ACUTE PT VISIT: 1 Visit         Lillia Pauls, PT, DPT Acute Rehabilitation Services Office [phone]   [name] 04/18/2023, 12:55 PM

## 2023-04-18 NOTE — Progress Notes (Signed)
Subjective: Patient reports doing well, minimal headache  Objective: Vital signs in last 24 hours: Temp:  [97.5 F (36.4 C)-98.9 F (37.2 C)] 97.8 F (36.6 C) (07/22 0800) Pulse Rate:  [54-87] 69 (07/22 1100) Resp:  [11-22] 15 (07/22 1100) BP: (91-171)/(61-90) 111/70 (07/22 1100) SpO2:  [88 %-99 %] 95 % (07/22 1100) Weight:  [104.3 kg] 104.3 kg (07/21 1155)  Intake/Output from previous day: 07/21 0701 - 07/22 0700 In: 2425.8 [I.V.:2025.8; IV Piggyback:400] Out: 1375 [Urine:1200; Drains:100; Blood:75] Intake/Output this shift: Total I/O In: 193.1 [I.V.:143.2; IV Piggyback:49.9] Out: 700 [Urine:700]  Neurologic: Grossly normal  Lab Results: Lab Results  Component Value Date   WBC 10.7 (H) 04/17/2023   HGB 15.0 04/17/2023   HCT 43.9 04/17/2023   MCV 102.1 (H) 04/17/2023   PLT 275 04/17/2023   Lab Results  Component Value Date   INR 1.13 11/15/2015   BMET Lab Results  Component Value Date   NA 135 04/17/2023   K 4.6 04/17/2023   CL 96 (L) 04/17/2023   CO2 27 04/17/2023   GLUCOSE 107 (H) 04/17/2023   BUN 26 (H) 04/17/2023   CREATININE 1.42 (H) 04/17/2023   CALCIUM 10.2 04/17/2023    Studies/Results: CT Head Wo Contrast  Result Date: 04/17/2023 CLINICAL DATA:  Headache, increasing frequency or severity. EXAM: CT HEAD WITHOUT CONTRAST TECHNIQUE: Contiguous axial images were obtained from the base of the skull through the vertex without intravenous contrast. RADIATION DOSE REDUCTION: This exam was performed according to the departmental dose-optimization program which includes automated exposure control, adjustment of the mA and/or kV according to patient size and/or use of iterative reconstruction technique. COMPARISON:  03/30/2023. FINDINGS: Brain: Increased size of the mixed attenuation left subdural hematoma with new internal hyperdensity, now measuring up to 26 mm in thickness (coronal image 34 series 4), previously 16 mm. Increased rightward midline shift, now  measuring up to 9 mm (coronal image 36 series 4). Increasing mass effect on the left lateral ventricle. No acute hydrocephalus. Cortical gray-white differentiation is preserved. Vascular: No hyperdense vessel or unexpected calcification. Skull: No calvarial fracture or suspicious bone lesion. Skull base is unremarkable. Sinuses/Orbits: Unremarkable. Other: None. IMPRESSION: Increased size of the mixed attenuation left subdural hematoma with new internal hyperdensity, now measuring up to 26 mm in thickness, previously 16 mm. Increased rightward midline shift, now measuring up to 9 mm. Critical Value/emergent results were called by telephone at the time of interpretation on 04/17/2023 at 1:32 pm to provider Lorre Nick , who verbally acknowledged these results. Electronically Signed   By: Orvan Falconer M.D.   On: 04/17/2023 13:37   DG Ribs Unilateral W/Chest Right  Result Date: 04/17/2023 CLINICAL DATA:  Status post fall x3. EXAM: RIGHT RIBS AND CHEST - 3+ VIEW COMPARISON:  CT chest 01/25/2022 FINDINGS: Stable cardiomediastinal contours. No pleural fluid, interstitial edema or airspace consolidation. Scarring is noted within the left lower lung. Extensive, chronic fracture deformities are noted involving the left ribs as noted on previous exam. There is a acute fracture involving the anterior aspect of the right eleventh rib. No additional acute fractures. IMPRESSION: 1. Acute fracture involving the anterior aspect of the right eleventh rib. 2. Extensive, chronic fracture deformities involving the left ribs. Electronically Signed   By: Signa Kell M.D.   On: 04/17/2023 13:26    Assessment/Plan: Postop day 1 burr hole for SDH. Doing really well, will transfer to progressive today. Possible home tomorrow. Continue drain. CT head today looks better   LOS: 1 day  Rodney [name] 04/18/2023, 11:48 AM

## 2023-04-18 NOTE — Progress Notes (Signed)
Pt walking with therapy

## 2023-04-18 NOTE — Progress Notes (Signed)
PT Cancellation Note  Patient Details Name: Rodney [name] MRN: [id number] DOB: 1946-09-14   Cancelled Treatment:    Reason Eval/Treat Not Completed: Patient at procedure or test/unavailable (CT)  Lillia Pauls, PT, DPT Acute Rehabilitation Services Office [phone]    [name] 04/18/2023, 9:42 AM

## 2023-04-19 MED ORDER — BISACODYL 5 MG PO TBEC: 5.0000 mg | DELAYED_RELEASE_TABLET | Freq: Every day | ORAL | Status: DC | PRN

## 2023-04-19 MED ORDER — POLYETHYLENE GLYCOL 3350 17 G PO PACK
17.0000 g | PACK | Freq: Every day | ORAL | Status: DC
  Administered 2023-04-19 – 2023-04-23 (×4): 17 g via ORAL
  Filled 2023-04-19 (×4): qty 1

## 2023-04-19 MED ORDER — LEVETIRACETAM 500 MG PO TABS
500.0000 mg | ORAL_TABLET | Freq: Two times a day (BID) | ORAL | Status: DC
  Administered 2023-04-19 – 2023-04-23 (×9): 500 mg via ORAL
  Filled 2023-04-19 (×9): qty 1

## 2023-04-19 NOTE — Anesthesia Postprocedure Evaluation (Signed)
Anesthesia Post Note  Patient: Rodney [name]  Procedure(s) Performed: [name] HOLES FOR EVACUATION OF SUBDURAL HEMATOMA (Head)     Patient location during evaluation: PACU Anesthesia Type: General Level of consciousness: awake and alert Pain management: pain level controlled Vital Signs Assessment: post-procedure vital signs reviewed and stable Respiratory status: spontaneous breathing, nonlabored ventilation, respiratory function stable and patient connected to nasal cannula oxygen Cardiovascular status: blood pressure returned to baseline and stable Postop Assessment: no apparent nausea or vomiting Anesthetic complications: no   No notable events documented.  Last Vitals:  Vitals:   04/19/23 0710 04/19/23 0837  BP: 125/72   Pulse: 61   Resp: 20   Temp: 36.6 C   SpO2: 91% 92%    Last Pain:  Vitals:   04/19/23 0710  TempSrc: Oral  PainSc:                  [name]

## 2023-04-19 NOTE — Progress Notes (Signed)
Physical Therapy Treatment Patient Details Name: Rodney [name] MRN: [id number] DOB: [birth year] Today's Date: 04/19/2023   History of Present Illness Pt is a 77 y.o. M who presents 04/17/2023 with increased frequency of falls and dizziness.  CT head shows increase in size of left sided SDH now measuring 26mm in size compared to 16mm on his last head CT a couple weeks ago. He also has increased midline shift now. S/p Rodney [name] hole craniectomy for evacuation of subacute subdural hematoma 7/21. Significant PMH: COPD, TIA, R THA, L SDH.    PT Comments  Pt progressing well towards his physical therapy goals, exhibits improved balance and right sided coordination today. Trialed cane today; pt ambulating 200 ft at a min guard assist level. Reports mild lightheadedness and "feeling wobbly." Overall, good pain control. Continue to recommend HHPT safety evaluation upon d/c home.     Assistance Recommended at Discharge Frequent or constant Supervision/Assistance  If plan is discharge home, recommend the following:  Can travel by private vehicle    A little help with walking and/or transfers;Assistance with cooking/housework;Direct supervision/assist for medications management;Direct supervision/assist for financial management;Assist for transportation      Equipment Recommendations  None recommended by PT    Recommendations for Other Services       Precautions / Restrictions Precautions Precautions: Fall;Other (comment) Precaution Comments: Scalp bag drain Restrictions Weight Bearing Restrictions: No     Mobility  Bed Mobility Overal bed mobility: Needs Assistance Bed Mobility: Supine to Sit     Supine to sit: Supervision     General bed mobility comments: Supervision for line management    Transfers Overall transfer level: Needs assistance Equipment used: None Transfers: Sit to/from Stand Sit to Stand: Min guard           General transfer comment: Slow to rise, utilizing  wider BOS    Ambulation/Gait Ambulation/Gait assistance: Min guard Gait Distance (Feet): 200 Feet Assistive device: Straight cane Gait Pattern/deviations: Step-through pattern, Decreased stride length Gait velocity: decreased     General Gait Details: Improved R sided coordination, min cues for use of cane, min guard overall for dynamic balance   Stairs             Wheelchair Mobility     Tilt Bed    Modified Rankin (Stroke Patients Only)       Balance Overall balance assessment: Mild deficits observed, not formally tested                                          Cognition Arousal/Alertness: Awake/alert Behavior During Therapy: WFL for tasks assessed/performed Overall Cognitive Status: Impaired/Different from baseline Area of Impairment: Attention, Memory, Safety/judgement, Awareness                   Current Attention Level: Selective Memory: Decreased short-term memory   Safety/Judgement: Decreased awareness of safety, Decreased awareness of deficits Awareness: Emergent   General Comments: Pt recalling all therapist names from yesterday.        Exercises      General Comments        Pertinent Vitals/Pain Pain Assessment Pain Assessment: Faces Faces Pain Scale: Hurts a little bit Pain Location: headache Pain Descriptors / Indicators: Headache Pain Intervention(s): Monitored during session    Home Living  Prior Function            PT Goals (current goals can now be found in the care plan section) Acute Rehab PT Goals Patient Stated Goal: to go home PT Goal Formulation: With patient Time For Goal Achievement: 05/02/23 Potential to Achieve Goals: Good Progress towards PT goals: Progressing toward goals    Frequency    Min 1X/week      PT Plan      Co-evaluation              AM-PAC PT "6 Clicks" Mobility   Outcome Measure  Help needed turning from your back  to your side while in a flat bed without using bedrails?: None Help needed moving from lying on your back to sitting on the side of a flat bed without using bedrails?: A Little Help needed moving to and from a bed to a chair (including a wheelchair)?: A Little Help needed standing up from a chair using your arms (e.g., wheelchair or bedside chair)?: A Little Help needed to walk in hospital room?: A Little Help needed climbing 3-5 steps with a railing? : A Little 6 Click Score: 19    End of Session Equipment Utilized During Treatment: Gait belt Activity Tolerance: Patient tolerated treatment well Patient left: in chair;with call bell/phone within reach;with chair alarm set Nurse Communication: Mobility status PT Visit Diagnosis: Unsteadiness on feet (R26.81);History of falling (Z91.81)     Time: 1610-9604 PT Time Calculation (min) (ACUTE ONLY): 24 min  Charges:    $Gait Training: 8-22 mins $Therapeutic Activity: 8-22 mins PT General Charges $$ ACUTE PT VISIT: 1 Visit                     Rodney [name], PT, DPT Acute Rehabilitation Services Office [phone]    Rodney [name] 04/19/2023, 8:57 AM

## 2023-04-19 NOTE — Plan of Care (Signed)
Will discharge 04/20/23

## 2023-04-19 NOTE — Progress Notes (Signed)
Occupational Therapy Treatment Patient Details Name: Rodney [name] MRN: [id number] DOB: 23-Jul-1946 Today's Date: 04/19/2023   History of present illness Pt is a 77 y.o. M who presents 04/17/2023 with increased frequency of falls and dizziness.  CT head shows increase in size of left sided SDH now measuring 26mm in size compared to 16mm on his last head CT a couple weeks ago. He also has increased midline shift now. S/p [name] hole craniectomy for evacuation of subacute subdural hematoma 7/21. Significant PMH: COPD, TIA, R THA, L SDH.   OT comments  Functional cognition further assessed with The Pillbox Test: A Measure of Executive Functioning and Estimate of Medication Management. A straight pass/fail designation is determined by 3 or more errors of omission or misplacement on the task. Pt had a total of >41errors.and failed the assessment, demonstrating deficits with planning, mental flexibility, suboptimal, concrete thinking and difficulty with multitasking.  Errors: One tablet 3x/day - 21 errors (omission/misplacement) One tablet 2x/day with breakfast and dinner - 13 errors (omission/misplacement) One tablet in the morning - 0 errors (omission/misplacement) One tablet daily at bedtime  - 7 errors(omission/misplacement) One tablet every other day - 0 errors (omission/misplacement)    Total time to complete task  - 20 min  Discussed need for direct assistance with medication and financial management with wife. Pt also states that he has been having difficulty with word finding at times and states he is having visual hallucinations at times. Recommend ST cog/language eval.  Acute OT to continue to follow to facilitate safe DC home with wife.    Recommendations for follow up therapy are one component of a multi-disciplinary discharge planning process, led by the attending physician.  Recommendations may be updated based on patient status, additional functional criteria and insurance  authorization.    Assistance Recommended at Discharge Frequent or constant Supervision/Assistance  Patient can return home with the following  Direct supervision/assist for medications management;Direct supervision/assist for financial management;Assist for transportation;Assistance with cooking/housework   Equipment Recommendations  Tub/shower seat    Recommendations for Other Services      Precautions / Restrictions Precautions Precautions: Fall;Other (comment) Precaution Comments: burr hole drainage bag       Mobility Bed Mobility Overal bed mobility: Needs Assistance Bed Mobility: Supine to Sit     Supine to sit: Min assist     General bed mobility comments: recommend sleeping in recliner due to rib pain with mobility    Transfers     Transfers: Sit to/from Stand Sit to Stand: Min guard                 Balance     Sitting balance-Leahy Scale: Good       Standing balance-Leahy Scale: Fair                             ADL either performed or assessed with clinical judgement   ADL                                              Extremity/Trunk Assessment              Vision       Perception     Praxis      Cognition Arousal/Alertness: Awake/alert Behavior During Therapy: WFL for tasks assessed/performed Overall Cognitive Status: Impaired/Different  from baseline Area of Impairment: Attention, Memory, Safety/judgement, Awareness                   Current Attention Level: Selective Memory: Decreased short-term memory   Safety/Judgement: Decreased awareness of safety, Decreased awareness of deficits Awareness: Emergent   General Comments: Assessed with the Pillbox test - did not recieve a passing score; educated pt/wife on interpretation of test and need for direct asisstance with medication and financial management        Exercises      Shoulder Instructions       General Comments       Pertinent Vitals/ Pain       Pain Assessment Pain Assessment: 0-10 Pain Score: 5  Pain Location: headache; R ribs Pain Descriptors / Indicators: Headache, Discomfort Pain Intervention(s): Limited activity within patient's tolerance  Home Living                                          Prior Functioning/Environment              Frequency  Min 1X/week        Progress Toward Goals  OT Goals(current goals can now be found in the care plan section)  Progress towards OT goals: Progressing toward goals  Acute Rehab OT Goals Patient Stated Goal: to get back to normal OT Goal Formulation: With patient Time For Goal Achievement: 05/02/23 Potential to Achieve Goals: Good ADL Goals Pt Will Perform Lower Body Bathing: with modified independence;sit to/from stand Pt Will Perform Lower Body Dressing: with modified independence;sit to/from stand Pt Will Transfer to Toilet: with modified independence;ambulating Additional ADL Goal #1: Pt will independently state 3 strategies to reduce risk of falls Additional ADL Goal #2: Pt will receive a passing score on the PillBox test to dmeonstrate the ability to safely manage medications  Plan Discharge plan remains appropriate    Co-evaluation                 AM-PAC OT "6 Clicks" Daily Activity     Outcome Measure   Help from another person eating meals?: None Help from another person taking care of personal grooming?: A Little Help from another person toileting, which includes using toliet, bedpan, or urinal?: A Little Help from another person bathing (including washing, rinsing, drying)?: A Little Help from another person to put on and taking off regular upper body clothing?: A Little Help from another person to put on and taking off regular lower body clothing?: A Little 6 Click Score: 19    End of Session Equipment Utilized During Treatment: Gait belt  OT Visit Diagnosis: Unsteadiness on feet  (R26.81);Repeated falls (R29.6);Other symptoms and signs involving cognitive function   Activity Tolerance Patient tolerated treatment well   Patient Left in chair;with call bell/phone within reach;with chair alarm set;with family/visitor present   Nurse Communication Mobility status        Time: 1610-9604 OT Time Calculation (min): 34 min  Charges: OT General Charges $OT Visit: 1 Visit OT Treatments $Self Care/Home Management : 23-37 mins  Luisa Dago, OT/L   Acute OT Clinical Specialist Acute Rehabilitation Services Pager [phone] Office [phone]   [facility] 04/19/2023, 3:54 PM

## 2023-04-19 NOTE — Progress Notes (Signed)
Subjective: Patient reports doing well. No headaches  Objective: Vital signs in last 24 hours: Temp:  [97.3 F (36.3 C)-99 F (37.2 C)] 97.9 F (36.6 C) (07/23 0710) Pulse Rate:  [59-87] 61 (07/23 0710) Resp:  [13-23] 20 (07/23 0710) BP: (91-143)/(58-111) 125/72 (07/23 0710) SpO2:  [83 %-98 %] 91 % (07/23 0710)  Intake/Output from previous day: 07/22 0701 - 07/23 0700 In: 1193.1 [P.O.:480; I.V.:562.7; IV Piggyback:150.4] Out: 1700 [Urine:1700] Intake/Output this shift: Total I/O In: -  Out: 900 [Urine:900]  Neurologic: Grossly normal  Lab Results: Lab Results  Component Value Date   WBC 10.7 (H) 04/17/2023   HGB 15.0 04/17/2023   HCT 43.9 04/17/2023   MCV 102.1 (H) 04/17/2023   PLT 275 04/17/2023   Lab Results  Component Value Date   INR 1.13 11/15/2015   BMET Lab Results  Component Value Date   NA 135 04/17/2023   K 4.6 04/17/2023   CL 96 (L) 04/17/2023   CO2 27 04/17/2023   GLUCOSE 107 (H) 04/17/2023   BUN 26 (H) 04/17/2023   CREATININE 1.42 (H) 04/17/2023   CALCIUM 10.2 04/17/2023    Studies/Results: CT HEAD WO CONTRAST ( )  Result Date: 04/18/2023 CLINICAL DATA:  Subdural hematoma. EXAM: CT HEAD WITHOUT CONTRAST TECHNIQUE: Contiguous axial images were obtained from the base of the skull through the vertex without intravenous contrast. RADIATION DOSE REDUCTION: This exam was performed according to the departmental dose-optimization program which includes automated exposure control, adjustment of the mA and/or kV according to patient size and/or use of iterative reconstruction technique. COMPARISON:  CT head 1 day prior. FINDINGS: Brain: There are new right frontal and parietal burr holes with a subdural drainage catheter in place for evacuation of the left cerebral convexity subdural hematoma. There is expected small volume postoperative pneumocephalus. The hematoma is significantly decreased in size, with residual fluid and blood measuring up 2 1.2 cm in  maximal thickness in the coronal plane (previously measured up to 2.6 cm). Mass effect on the underlying brain parenchyma has decreased, with partial re-expansion of the left lateral ventricle and decreased midline shift now measuring up to 3 mm at the level of the septum pellucidum, previously measured up to 9 mm. There is no new acute intracranial hemorrhage. There is no acute territorial infarct. The pituitary and suprasellar region are normal. Vascular: No hyperdense vessel or unexpected calcification. Skull: Postsurgical changes as above. Sinuses/Orbits: The imaged paranasal sinuses are clear. Bilateral lens implants are in place. The globes and orbits are otherwise unremarkable. Other: The mastoid air cells and middle ear cavities are clear. IMPRESSION: Decreased size of the left cerebral convexity subdural hematoma with improved mass effect and midline shift following evacuation. Electronically Signed   By: Lesia Hausen M.D.   On: 04/18/2023 13:28   CT Head Wo Contrast  Result Date: 04/17/2023 CLINICAL DATA:  Headache, increasing frequency or severity. EXAM: CT HEAD WITHOUT CONTRAST TECHNIQUE: Contiguous axial images were obtained from the base of the skull through the vertex without intravenous contrast. RADIATION DOSE REDUCTION: This exam was performed according to the departmental dose-optimization program which includes automated exposure control, adjustment of the mA and/or kV according to patient size and/or use of iterative reconstruction technique. COMPARISON:  03/30/2023. FINDINGS: Brain: Increased size of the mixed attenuation left subdural hematoma with new internal hyperdensity, now measuring up to 26 mm in thickness (coronal image 34 series 4), previously 16 mm. Increased rightward midline shift, now measuring up to 9 mm (coronal image 36 series 4).  Increasing mass effect on the left lateral ventricle. No acute hydrocephalus. Cortical gray-white differentiation is preserved. Vascular: No  hyperdense vessel or unexpected calcification. Skull: No calvarial fracture or suspicious bone lesion. Skull base is unremarkable. Sinuses/Orbits: Unremarkable. Other: None. IMPRESSION: Increased size of the mixed attenuation left subdural hematoma with new internal hyperdensity, now measuring up to 26 mm in thickness, previously 16 mm. Increased rightward midline shift, now measuring up to 9 mm. Critical Value/emergent results were called by telephone at the time of interpretation on 04/17/2023 at 1:32 pm to provider Lorre Nick , who verbally acknowledged these results. Electronically Signed   By: Orvan Falconer M.D.   On: 04/17/2023 13:37   DG Ribs Unilateral W/Chest Right  Result Date: 04/17/2023 CLINICAL DATA:  Status post fall x3. EXAM: RIGHT RIBS AND CHEST - 3+ VIEW COMPARISON:  CT chest 01/25/2022 FINDINGS: Stable cardiomediastinal contours. No pleural fluid, interstitial edema or airspace consolidation. Scarring is noted within the left lower lung. Extensive, chronic fracture deformities are noted involving the left ribs as noted on previous exam. There is a acute fracture involving the anterior aspect of the right eleventh rib. No additional acute fractures. IMPRESSION: 1. Acute fracture involving the anterior aspect of the right eleventh rib. 2. Extensive, chronic fracture deformities involving the left ribs. Electronically Signed   By: Signa Kell M.D.   On: 04/17/2023 13:26    Assessment/Plan: Postop day 2 burr hole for SDH. Doing well, Drain put out .   LOS: 2 days    Tiana Loft [facility] 04/19/2023, 7:46 AM

## 2023-04-20 ENCOUNTER — Inpatient Hospital Stay (HOSPITAL_COMMUNITY): Payer: Medicare Other

## 2023-04-20 DIAGNOSIS — S065XAA Traumatic subdural hemorrhage with loss of consciousness status unknown, initial encounter: Secondary | ICD-10-CM | POA: Diagnosis not present

## 2023-04-20 LAB — CBC
HCT: 37.6 % — ABNORMAL LOW (ref 39.0–52.0)
Hemoglobin: 12.6 g/dL — ABNORMAL LOW (ref 13.0–17.0)
MCH: 36.1 pg — ABNORMAL HIGH (ref 26.0–34.0)
MCHC: 33.5 g/dL (ref 30.0–36.0)
MCV: 107.7 fL — ABNORMAL HIGH (ref 80.0–100.0)
Platelets: 253 10*3/uL (ref 150–400)
RBC: 3.49 MIL/uL — ABNORMAL LOW (ref 4.22–5.81)
RDW: 13.6 % (ref 11.5–15.5)
WBC: 9.3 10*3/uL (ref 4.0–10.5)
nRBC: 0 % (ref 0.0–0.2)

## 2023-04-20 LAB — BASIC METABOLIC PANEL
Anion gap: 10 (ref 5–15)
BUN: 15 mg/dL (ref 8–23)
CO2: 22 mmol/L (ref 22–32)
Calcium: 8.2 mg/dL — ABNORMAL LOW (ref 8.9–10.3)
Chloride: 101 mmol/L (ref 98–111)
Creatinine, Ser: 1 mg/dL (ref 0.61–1.24)
GFR, Estimated: >60 mL/min (ref >60)
Glucose, Bld: 124 mg/dL — ABNORMAL HIGH (ref 70–99)
Potassium: 4.1 mmol/L (ref 3.5–5.1)
Sodium: 133 mmol/L — ABNORMAL LOW (ref 135–145)

## 2023-04-20 LAB — LACTIC ACID, PLASMA: Lactic Acid, Venous: 1.6 mmol/L (ref 0.5–1.9)

## 2023-04-20 MED ORDER — SODIUM CHLORIDE 0.9 % IV SOLN: INTRAVENOUS | Status: AC

## 2023-04-20 MED ORDER — SODIUM CHLORIDE 0.9 % IV SOLN: Freq: Once | INTRAVENOUS | Status: AC

## 2023-04-20 NOTE — TOC Progression Note (Signed)
Transition of Care [facility]) - Progression Note    Patient Details  Name: Rodney [name] MRN: [id number] Date of Birth: 17-Mar-1946  Transition of Care [facility]) CM/SW Contact  Huston Foley Jacklynn Ganong, RN Phone Number: 04/20/2023, 9:10 AM  Clinical Narrative:    Case Manager spoke with patient's wife on 7/23 to discuss recommendation for Home Health services. Discussed choice of agencies, she has no preference and gave CM permission to arrange with agency that will accept their coverage. Referral called to [facility], Liaison with [facility]. The have no DME needs per patient's wife.    Expected Discharge Plan: Home w Home Health Services Barriers to Discharge: Continued Medical Work up  Expected Discharge Plan and Services In-house Referral: NA Discharge Planning Services: CM Consult Post Acute Care Choice: Home Health Living arrangements for the past 2 months: Single Family Home                 DME Arranged: N/A DME Agency:  (no DME needed/ no agency contacted)       HH Arranged: PT, OT HH Agency: [facility] Date HH Agency Contacted: 04/20/23 Time HH Agency Contacted: [id number] Representative spoke with at [facility] Agency: Tresa Endo   Social Determinants of Health (SDOH) Interventions SDOH Screenings   Food Insecurity: Low Risk  (04/15/2023)   Received from [facility]  Utilities: Low Risk  (04/15/2023)   Received from [facility]  Tobacco Use: High Risk (04/17/2023)    Readmission Risk Interventions     No data to display

## 2023-04-20 NOTE — Progress Notes (Signed)
Patient has had a couple of hallucinations today in the bed. And vertigo when standing. Paged doctor on call due to objects moving in the room that are not moving and also channel off the IV pole fell and hit his hand. I did report safety zone for his hand. And waiting for call back from doctor. Will continue to monitor.

## 2023-04-20 NOTE — Care Management Important Message (Signed)
Important Message  Patient Details  Name: Rodney [name] MRN: [id number] Date of Birth: 1946/07/27   Medicare Important Message Given:  Yes     Sherilyn Banker 04/20/2023, 11:42 AM

## 2023-04-20 NOTE — Progress Notes (Signed)
Occupational Therapy Treatment Patient Details Name: Rodney [name] MRN: [id number] DOB: 1946/08/03 Today's Date: 04/20/2023   History of present illness Pt is a 77 y.o. M who presents 04/17/2023 with increased frequency of falls and dizziness.  CT head shows increase in size of left sided SDH now measuring 26mm in size compared to 16mm on his last head CT a couple weeks ago. He also has increased midline shift now. S/p [name] hole craniectomy for evacuation of subacute subdural hematoma 7/21. Significant PMH: COPD, TIA, R THA, L SDH.   OT comments  Pt making steady progress toward goals. Able to complete ADL @ sink level with S. Pt ambulated in room and completed ADL using [place] without LOB. Educated Rodney [name] on strategies to reduce risk of falls. Spoke with nsg regarding need for dressing changes on BUE. Continue to recommend DC home with [facility] and direct assistance with medication and financial management.    Recommendations for follow up therapy are one component of a multi-disciplinary discharge planning process, led by the attending physician.  Recommendations may be updated based on patient status, additional functional criteria and insurance authorization.    Assistance Recommended at Discharge Frequent or constant Supervision/Assistance  Patient can return home with the following  Assistance with cooking/housework;A lot of help with bathing/dressing/bathroom;Direct supervision/assist for medications management;Direct supervision/assist for financial management;Assist for transportation   Equipment Recommendations  Tub/shower seat    Recommendations for Other Services      Precautions / Restrictions Precautions Precautions: Fall;Other (comment) Precaution Comments: burr hole drainage bag       Mobility Bed Mobility Overal bed mobility: Needs Assistance Bed Mobility: Supine to Sit     Supine to sit: Min assist     General bed mobility comments: recommend sleeping in recliner due  to rib pain with mobility    Transfers Overall transfer level: Needs assistance Equipment used: Straight cane Transfers: Sit to/from Stand Sit to Stand: Supervision                 Balance Overall balance assessment: History of Falls, Needs assistance   Sitting balance-Leahy Scale: Good       Standing balance-Leahy Scale: Fair                             ADL either performed or assessed with clinical judgement   ADL Overall ADL's : Needs assistance/impaired                                     Functional mobility during ADLs: Supervision/safety General ADL Comments: Completed ADL task @ sink level with overall S. Educated Rodney [name] on importance of completing majority of bath in seated position to reduce risk of falls; no LOB during session. Recommended use of reacher to retrieve items from floor; use of long handled sponge to help reduce risk of falls when bathing; states he can use his wife's shoer chair    Extremity/Trunk Assessment Upper Extremity Assessment Upper Extremity Assessment: Overall WFL for tasks assessed (wounds BUE  - WOC nurse changing dressing at end of session)   Lower Extremity Assessment Lower Extremity Assessment: Defer to PT evaluation        Vision   Vision Assessment?:  (wears glasses)   Perception     Praxis      Cognition Arousal/Alertness: Awake/alert Behavior During Therapy: WFL for tasks assessed/performed Overall  Cognitive Status: Impaired/Different from baseline Area of Impairment: Attention, Safety/judgement, Awareness, Problem solving, Memory                   Current Attention Level: Selective Memory:  (decreased wroking memory)   Safety/Judgement: Decreased awareness of deficits Awareness: Emergent Problem Solving: Slow processing General Comments: verbal cues to recall information; pt very verbal and requires cues/repetition adn repeating information to help recall        Exercises       Shoulder Instructions       General Comments      Pertinent Vitals/ Pain       Pain Assessment Pain Assessment: No/denies pain  Home Living                                          Prior Functioning/Environment              Frequency  Min 1X/week        Progress Toward Goals  OT Goals(current goals can now be found in the care plan section)  Progress towards OT goals: Progressing toward goals  Acute Rehab OT Goals Patient Stated Goal: to get better OT Goal Formulation: With patient Time For Goal Achievement: 05/02/23 Potential to Achieve Goals: Good ADL Goals Pt Will Perform Lower Body Bathing: with modified independence;sit to/from stand Pt Will Perform Lower Body Dressing: with modified independence;sit to/from stand Pt Will Transfer to Toilet: with modified independence;ambulating Additional ADL Goal #1: Pt will independently state 3 strategies to reduce risk of falls Additional ADL Goal #2: Pt will receive a passing score on the PillBox test to dmeonstrate the ability to safely manage medications  Plan Discharge plan remains appropriate    Co-evaluation                 AM-PAC OT "6 Clicks" Daily Activity     Outcome Measure   Help from another person eating meals?: None Help from another person taking care of personal grooming?: A Little Help from another person toileting, which includes using toliet, bedpan, or urinal?: A Little Help from another person bathing (including washing, rinsing, drying)?: A Little Help from another person to put on and taking off regular upper body clothing?: A Little Help from another person to put on and taking off regular lower body clothing?: A Little 6 Click Score: 19    End of Session Equipment Utilized During Treatment: Gait belt;Other (comment) (cane)  OT Visit Diagnosis: Unsteadiness on feet (R26.81);Repeated falls (R29.6);Other symptoms and signs involving cognitive function    Activity Tolerance Patient tolerated treatment well   Patient Left in chair;with call bell/phone within reach;with chair alarm set   Nurse Communication Mobility status;Other (comment) (arm bandage needs to be chnaged/contact WOC nurse)        Time: [phone] OT Time Calculation (min): 42 min  Charges: OT General Charges $OT Visit: 1 Visit OT Treatments $Self Care/Home Management : 38-52 mins  Luisa Dago, OT/L   Acute OT Clinical Specialist Acute Rehabilitation Services Pager [phone] Office [phone]   [facility] 04/20/2023, 10:31 AM
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
## 2023-04-20 NOTE — Progress Notes (Signed)
Physical Therapy Treatment Patient Details Name: Rodney [name] MRN: [id number] DOB: Jul 17, 1946 Today's Date: 04/20/2023   History of Present Illness Pt is a 77 y.o. M who presents 04/17/2023 with increased frequency of falls and dizziness.  CT head shows increase in size of left sided SDH now measuring 26mm in size compared to 16mm on his last head CT a couple weeks ago. He also has increased midline shift now. S/p Rodney [name] hole craniectomy for evacuation of subacute subdural hematoma 7/21. Significant PMH: COPD, TIA, R THA, L SDH.    PT Comments  Pt received up in chair and agreeable to PT session. Once standing, pt reporting increased dizziness with noted tremulousness. Returned to sitting position, BP measured at 89/57 (68). Worked on seated exercises (isometric and ROM); BP re-measured 5 minutes later at 83/56 (64). Assisted with transferring pt back to bed. In supine position, BP 91/60 (69). Notified RN. Will continue to progress as tolerated.    Assistance Recommended at Discharge Frequent or constant Supervision/Assistance  If plan is discharge home, recommend the following:  Can travel by private vehicle    A little help with walking and/or transfers;Assistance with cooking/housework;Direct supervision/assist for medications management;Direct supervision/assist for financial management;Assist for transportation      Equipment Recommendations  None recommended by PT    Recommendations for Other Services       Precautions / Restrictions Precautions Precautions: Fall;Other (comment) Precaution Comments: burr hole drainage bag, watch BP Restrictions Weight Bearing Restrictions: No     Mobility  Bed Mobility Overal bed mobility: Needs Assistance Bed Mobility: Sit to Supine       Sit to supine: Supervision        Transfers Overall transfer level: Needs assistance Equipment used: Straight cane, None Transfers: Sit to/from Stand, Bed to chair/wheelchair/BSC Sit to  Stand: Min guard Stand pivot transfers: Min guard         General transfer comment: Min guard for safety, increased tremulousness    Ambulation/Gait               General Gait Details: deferred due to hypotension   Stairs             Wheelchair Mobility     Tilt Bed    Modified Rankin (Stroke Patients Only)       Balance Overall balance assessment: History of Falls, Needs assistance   Sitting balance-Leahy Scale: Good       Standing balance-Leahy Scale: Fair                              Cognition Arousal/Alertness: Awake/alert Behavior During Therapy: WFL for tasks assessed/performed Overall Cognitive Status: Impaired/Different from baseline Area of Impairment: Attention, Safety/judgement, Awareness, Problem solving, Memory                   Current Attention Level: Selective Memory: Decreased short-term memory   Safety/Judgement: Decreased awareness of deficits Awareness: Emergent Problem Solving: Slow processing          Exercises General Exercises - Lower Extremity Long Arc Quad: Both, 10 reps, Seated Hip ABduction/ADduction: Both, 20 reps, Seated Hip Flexion/Marching: Both, 10 reps, Seated Heel Raises: Both, 20 reps, Seated    General Comments        Pertinent Vitals/Pain Pain Assessment Pain Assessment: No/denies pain    Home Living  Prior Function            PT Goals (current goals can now be found in the care plan section) Acute Rehab PT Goals Potential to Achieve Goals: Good    Frequency    Min 1X/week      PT Plan Current plan remains appropriate    Co-evaluation              AM-PAC PT "6 Clicks" Mobility   Outcome Measure  Help needed turning from your back to your side while in a flat bed without using bedrails?: None Help needed moving from lying on your back to sitting on the side of a flat bed without using bedrails?: A Little Help needed  moving to and from a bed to a chair (including a wheelchair)?: A Little Help needed standing up from a chair using your arms (e.g., wheelchair or bedside chair)?: A Little Help needed to walk in hospital room?: A Little Help needed climbing 3-5 steps with a railing? : A Little 6 Click Score: 19    End of Session Equipment Utilized During Treatment: Gait belt Activity Tolerance: Other (comment) (limited by hypotension) Patient left: with call bell/phone within reach;in bed Nurse Communication: Mobility status PT Visit Diagnosis: Unsteadiness on feet (R26.81);History of falling (Z91.81)     Time: 1610-9604 PT Time Calculation (min) (ACUTE ONLY): 21 min  Charges:    $Therapeutic Activity: 8-22 mins PT General Charges $$ ACUTE PT VISIT: 1 Visit                     Rodney [name], PT, DPT Acute Rehabilitation Services Office [phone]    Rodney [name] 04/20/2023, 1:43 PM

## 2023-04-20 NOTE — Consult Note (Addendum)
WOC Nurse Consult Note: Reason for Consult: Consult requested for bilat arm skin tears.  Left lower arm with 2 partial thickness abrasions; 2X1X.1cm and 1X1X.1cm, both red and moist, no bleeding Right upper arm with full thickness wound; previous dressing well-adhered to woundbed and difficult to remove, despite using NS.  Wound had small amt bleeding after dressing removed; 100% red, 7X3X.2cm Right lower arm with 2 full thickness abrasions .5X.5X.1cm and 1X1X.1cm with small amt bleeding, red and moist Dressing procedure/placement/frequency: Topical treatment orders provided for bedside nurses to perform as follows to promote healing:  1. Apply double-folded Xeroform gauze to right upper arm wound Q day, then cover with ABD pad and kerlex 2. Foam dressing to bilat arms; change Q 3 days or PRN soiling Please re-consult if further assistance is needed.  Thank-you,  Cammie Mcgee MSN, RN, CWOCN, [place], CNS [phone]

## 2023-04-20 NOTE — Progress Notes (Signed)
Subjective: Patient reports being a little dizzy when he gets up but overall doing well  Objective: Vital signs in last 24 hours: Temp:  [97.7 F (36.5 C)-99.4 F (37.4 C)] 97.7 F (36.5 C) (07/24 1458) Pulse Rate:  [63-70] 70 (07/24 1458) Resp:  [13-19] 16 (07/24 1458) BP: (110-132)/(70-78) 122/70 (07/24 1458) SpO2:  [96 %-99 %] 98 % (07/24 1458)  Intake/Output from previous day: 07/23 0701 - 07/24 0700 In: 740 [P.O.:740] Out: 1910 [Urine:1900; Drains:10] Intake/Output this shift: Total I/O In: -  Out: 900 [Urine:900]  Neurologic: Grossly normal  Lab Results: Lab Results  Component Value Date   WBC 10.7 (H) 04/17/2023   HGB 15.0 04/17/2023   HCT 43.9 04/17/2023   MCV 102.1 (H) 04/17/2023   PLT 275 04/17/2023   Lab Results  Component Value Date   INR 1.13 11/15/2015   BMET Lab Results  Component Value Date   NA 135 04/17/2023   K 4.6 04/17/2023   CL 96 (L) 04/17/2023   CO2 27 04/17/2023   GLUCOSE 107 (H) 04/17/2023   BUN 26 (H) 04/17/2023   CREATININE 1.42 (H) 04/17/2023   CALCIUM 10.2 04/17/2023    Studies/Results: No results found.  Assessment/Plan: Postop day 3 evacuation of SDH. He got a little orthostatic today. Will order a fluid bolus and have medicine see him for this.    LOS: 3 days    Tiana Loft [facility] 04/20/2023, 3:16 PM

## 2023-04-21 DIAGNOSIS — S065XAA Traumatic subdural hemorrhage with loss of consciousness status unknown, initial encounter: Secondary | ICD-10-CM | POA: Diagnosis not present

## 2023-04-21 DIAGNOSIS — R441 Visual hallucinations: Secondary | ICD-10-CM

## 2023-04-21 LAB — BASIC METABOLIC PANEL
Anion gap: 8 (ref 5–15)
BUN: 13 mg/dL (ref 8–23)
CO2: 25 mmol/L (ref 22–32)
Calcium: 8.5 mg/dL — ABNORMAL LOW (ref 8.9–10.3)
Creatinine, Ser: 0.98 mg/dL (ref 0.61–1.24)
GFR, Estimated: >60 mL/min (ref >60)
Sodium: 133 mmol/L — ABNORMAL LOW (ref 135–145)

## 2023-04-21 LAB — CBC
HCT: 39.3 % (ref 39.0–52.0)
Hemoglobin: 13 g/dL (ref 13.0–17.0)
MCHC: 33.1 g/dL (ref 30.0–36.0)
MCV: 103.7 fL — ABNORMAL HIGH (ref 80.0–100.0)
Platelets: 262 10*3/uL (ref 150–400)
RBC: 3.79 MIL/uL — ABNORMAL LOW (ref 4.22–5.81)
RDW: 13.4 % (ref 11.5–15.5)
WBC: 9 10*3/uL (ref 4.0–10.5)
nRBC: 0 % (ref 0.0–0.2)

## 2023-04-21 LAB — HEPATIC FUNCTION PANEL
ALT: 15 U/L (ref 0–44)
AST: 21 U/L (ref 15–41)
Albumin: 2.6 g/dL — ABNORMAL LOW (ref 3.5–5.0)
Alkaline Phosphatase: 31 U/L — ABNORMAL LOW (ref 38–126)
Bilirubin, Direct: 0.1 mg/dL (ref 0.0–0.2)
Indirect Bilirubin: 0.4 mg/dL (ref 0.3–0.9)
Total Bilirubin: 0.5 mg/dL (ref 0.3–1.2)
Total Protein: 5.8 g/dL — ABNORMAL LOW (ref 6.5–8.1)

## 2023-04-21 LAB — PHOSPHORUS: Phosphorus: 2.9 mg/dL (ref 2.5–4.6)

## 2023-04-21 LAB — MAGNESIUM: Magnesium: 1.9 mg/dL (ref 1.7–2.4)

## 2023-04-21 MED ORDER — FOLIC ACID 1 MG PO TABS
1.0000 mg | ORAL_TABLET | Freq: Every day | ORAL | Status: DC
  Administered 2023-04-21 – 2023-04-23 (×3): 1 mg via ORAL
  Filled 2023-04-21 (×3): qty 1

## 2023-04-21 MED ORDER — ADULT MULTIVITAMIN W/MINERALS CH: 1.0000 | ORAL_TABLET | Freq: Every day | ORAL | Status: DC

## 2023-04-21 MED ORDER — THIAMINE MONONITRATE 100 MG PO TABS
100.0000 mg | ORAL_TABLET | Freq: Every day | ORAL | Status: DC
  Administered 2023-04-21 – 2023-04-23 (×3): 100 mg via ORAL
  Filled 2023-04-21 (×3): qty 1

## 2023-04-21 MED ORDER — THIAMINE HCL 100 MG/ML IJ SOLN: 100.0000 mg | Freq: Every day | INTRAMUSCULAR | Status: DC

## 2023-04-21 MED ORDER — SODIUM CHLORIDE 0.9 % IV SOLN: INTRAVENOUS | Status: AC

## 2023-04-21 MED ORDER — LORAZEPAM 1 MG PO TABS: 1.0000 mg | ORAL_TABLET | ORAL | Status: DC | PRN

## 2023-04-21 NOTE — Progress Notes (Signed)
Physical Therapy Treatment Patient Details Name: Rodney [name] MRN: [id number] DOB: January 15, 1946 Today's Date: 04/21/2023   History of Present Illness Pt is a 77 y.o. M who presents 04/17/2023 with increased frequency of falls and dizziness.  CT head shows increase in size of left sided SDH now measuring 26mm in size compared to 16mm on his last head CT a couple weeks ago. He also has increased midline shift now. S/p [name] hole craniectomy for evacuation of subacute subdural hematoma 7/21. Significant PMH: COPD, TIA, R THA, L SDH.    PT Comments  Patient reports still difficulty rising due to rib pain so encouraged to try up from L side next session as R arm is painful.  States has multiple recent falls and is undergoing work up.  Likely better to initiate outpatient PT as HHPT though not a bad idea to do initial HHPT for home safety.  Seems his wife has medical issues as well since she had lung surgery in December and is limited with distance and on a walker herself.  Encouraged pt to hire assistance especially for things like putting away groceries and anything requiring heavy lifting.  PT will continue to follow.     Assistance Recommended at Discharge Frequent or constant Supervision/Assistance  If plan is discharge home, recommend the following:  Can travel by private vehicle    A little help with walking and/or transfers;Assistance with cooking/housework;Direct supervision/assist for medications management;Direct supervision/assist for financial management;Assist for transportation      Equipment Recommendations  None recommended by PT    Recommendations for Other Services       Precautions / Restrictions Precautions Precautions: Fall Precaution Comments: watch BP     Mobility  Bed Mobility Overal bed mobility: Needs Assistance Bed Mobility: Rolling, Sidelying to Sit Rolling: Min guard Sidelying to sit: Min assist       General bed mobility comments: cue for pushing up  from sidelying, but R elbow sore so plans to roll L next time    Transfers Overall transfer level: Needs assistance Equipment used: Straight cane Transfers: Sit to/from Stand Sit to Stand: Min guard           General transfer comment: cues for safety and assist for lines    Ambulation/Gait Ambulation/Gait assistance: Min guard, Supervision Gait Distance (Feet): 500 Feet Assistive device: Straight cane Gait Pattern/deviations: Step-through pattern, Decreased stride length, Wide base of support       General Gait Details: assist for telemetry lines and cues for cane at times due to dragging it behind him, one R posterior LOB with minguard for recovery at times able to walk with S   Stairs             Wheelchair Mobility     Tilt Bed    Modified Rankin (Stroke Patients Only)       Balance Overall balance assessment: Needs assistance   Sitting balance-Leahy Scale: Good       Standing balance-Leahy Scale: Fair                              Cognition Arousal/Alertness: Awake/alert Behavior During Therapy: WFL for tasks assessed/performed Overall Cognitive Status: Within Functional Limits for tasks assessed  Exercises      General Comments General comments (skin integrity, edema, etc.): on RA SpO2 90's or greater, BP 130's/70's in sitting today      Pertinent Vitals/Pain Pain Assessment Pain Assessment: Faces Faces Pain Scale: Hurts little more Pain Location: R ribs and R hand with mobility Pain Descriptors / Indicators: Discomfort, Sore Pain Intervention(s): Monitored during session, Repositioned    Home Living                          Prior Function            PT Goals (current goals can now be found in the care plan section) Progress towards PT goals: Progressing toward goals    Frequency    Min 1X/week      PT Plan Current plan remains appropriate     Co-evaluation              AM-PAC PT "6 Clicks" Mobility   Outcome Measure  Help needed turning from your back to your side while in a flat bed without using bedrails?: A Little Help needed moving from lying on your back to sitting on the side of a flat bed without using bedrails?: A Little Help needed moving to and from a bed to a chair (including a wheelchair)?: A Little Help needed standing up from a chair using your arms (e.g., wheelchair or bedside chair)?: A Little Help needed to walk in hospital room?: A Little Help needed climbing 3-5 steps with a railing? : Total 6 Click Score: 16    End of Session Equipment Utilized During Treatment: Gait belt Activity Tolerance: Patient tolerated treatment well Patient left: in bed;with call bell/phone within reach   PT Visit Diagnosis: Unsteadiness on feet (R26.81);History of falling (Z91.81)     Time: 3295-1884 PT Time Calculation (min) (ACUTE ONLY): 28 min  Charges:    $Gait Training: 23-37 mins PT General Charges $$ ACUTE PT VISIT: 1 Visit                     Sheran Lawless, PT Acute Rehabilitation Services Office:[phone] 04/21/2023    [name] 04/21/2023, 5:31 PM

## 2023-04-21 NOTE — Hospital Course (Addendum)
HPI per Dr. Mikey College on 04/20/23 Rodney [name] is a 77 y.o. male with medical history significant of HTN, HTN, chronic HFpEF, BPH, chronic left subdural hematoma presented presented with frequent falls on 7/21.  Workup in the ED found patient had enlarged left subdural hematoma and patient underwent emergency SDH evacuation on same day.  Patient tolerated procedure well.  After the procedure, patient however has had poor appetite and oral intake for Sunday and Monday, and his appetite picked up somewhat yesterday.  Denies any abdominal pain no nauseous vomiting or diarrhea.  This morning, the patient was feeling dizzy when standing up, and vital signs showed patient had hypotension the whole morning.  Afternoon patient started having visual hallucination with "Clock and TV set upside down and on the floor" but he denies any vertigo and symptoms lasted 3 to 5 minutes.  Patient also been taking around-the-clock Vicodin for surgical pain and 3 doses before this afternoon, when his blood pressure was systolic in the 80s and was given IV bolus of 1000 mL normal saline and visual hallucinations resolved.   **Interim History States he was hallucinating and states he had been having some hallucinations prior to admission but never "this bad." Admitted to being a daily drinker (heavy drinking and approximately 8 shots a day) and stated that his last drink was Sunday. He feels his hallucinations are 2/2 to his daily drinking. He hallucinated yesterday afternoon very briefly. Awaiting Orthostatic VS but states dizziness is improving. Now complaining of Urinary Burning and agree with obtaining a U/A. If CIWA Scores consistently <5 and Hallucinations improve, he can be discharged from a Medicine standpoint.   Assessment and Plan:  Visual Hallucinations in the Setting of EtOH Withdrawal -Suspect multifactorial, clinically suspect commendation of hypotension as well as with ongoing narcotic usage with a possible  impaired kidney function but most likely Alcohol Withdrawal related given his heavy alcohol abuse -Symptoms resolved after blood pressure improved but will monitor overnight to ensure he does not hallucinate further and had a brief episode on 04/21/23 -Recommend hold off home BP meds including amlodipine, ARB and spironolactone -Agreed with as needed labetalol -Placed on CIWA Protocol given his Hx of Alcohol Abuse -Will hold off brain imaging for now as patient's visual hallucination resolved spontaneously yesterday after IV hydration, consider brain imaging if visual hallucinations comes back -Place on Delirium Precautions -If No hallucinations overnight and if BP is stable and patient is not withdrawing suspect he can be discharged Home with Home Health in the AM. Ensure CIWA Scores are less than 5 consistently    Hypotension -Input and output record showed the patient has a gross 2500 ml of negative in the last 2 days, implying volume contraction/dehydration, will Continued IV fluid x 12 hours and recheck kidney function and vital signs/orthostatic signs -Check Orthostatics in the the AM and pending    AKI, improved  -On admission creatinine 1.4 compared to baseline less than 1.0 about 1 year ago, with a gross negative I&O --BUN/Cr Trend: Recent Labs  Lab 04/17/23 1203 04/20/23 1946 04/21/23 0001 04/22/23 0409  BUN 26* 15 13 13   CREATININE 1.42* 1.00 0.98 0.93  -C/w IVF Hydration and received IVF Hydration with NS at 125 mL/hr x12 hours and reduced rate to 75 mL/hr x 12 hours -Avoid Nephrotoxic Medications, Contrast Dyes, Hypotension and Dehydration to Ensure Adequate Renal Perfusion and will need to Renally Adjust Meds -Continue to Monitor and Trend Renal Function carefully and repeat CMP in the AM  Hyponatremia -Na+ Trend: Recent Labs  Lab 04/17/23 1203 04/20/23 1946 04/21/23 0001 04/22/23 0409  NA 135 133* 133* 133*  -IVF now stopped. ? If Hyponatremia is related to  Alcoholism and Potomania  -Continue to Monitor and Trend and Repeat CMP in the AM   History of Alcohol Abuse with Withdrawal Concern -Last drink was 4 Days ago and could likely to contribute to his current mentation changes given that he is within the withdrawal period. Placed on CIWA Protocol and will monitor for any more Hallucinations  Urinary Burning -Agree with Obtaining a U/A -U/A Unremarkable and likely does not need Abx given No Leukocytes, Nitrites; Reflex not sent  -Trial of Pyridium   SDU status post hematoma evacuation -Stable, as per primary team  Macrocytic Anemia -Hgb/Hct Trend: Recent Labs  Lab 04/17/23 1203 04/20/23 1946 04/21/23 0001 04/22/23 0409  HGB 15.0 12.6* 13.0 12.3*  HCT 43.9 37.6* 39.3 36.8*  MCV 102.1* 107.7* 103.7* 101.7*  -Check Anemia Panel in the AM -Continue to Monitor for S/Sx of Bleeding; no overt bleeding noted -Repeat CBC in the AM  Hypoalbuminemia -Patient's Albumin Trend: Recent Labs  Lab 04/21/23 0830 04/22/23 0409  ALBUMIN 2.6* 2.7*  -Continue to Monitor and Trend and repeat CMP in the AM  Obesity -Complicates overall prognosis and care -Estimated body mass index is 32.08 kg/m as calculated from the following:   Height as of this encounter: 5\' 11"  (1.803 m).   Weight as of this encounter: 104.3 kg.  -Weight Loss and Dietary Counseling given

## 2023-04-21 NOTE — Progress Notes (Signed)
Subjective: Patient reports doing well, hallucinated over night. No headaches   Objective: Vital signs in last 24 hours: Temp:  [97.7 F (36.5 C)-98.4 F (36.9 C)] 97.7 F (36.5 C) (07/25 0723) Pulse Rate:  [63-70] 63 (07/25 0723) Resp:  [13-20] 13 (07/25 0723) BP: (104-137)/(70-76) 123/73 (07/25 0723) SpO2:  [94 %-98 %] 98 % (07/25 0723)  Intake/Output from previous day: 07/24 0701 - 07/25 0700 In: 1122.4 [I.V.:1122.4] Out: 3300 [Urine:3300] Intake/Output this shift: No intake/output data recorded.  Neurologic: Grossly normal  Lab Results: Lab Results  Component Value Date   WBC 9.0 04/21/2023   HGB 13.0 04/21/2023   HCT 39.3 04/21/2023   MCV 103.7 (H) 04/21/2023   PLT 262 04/21/2023   Lab Results  Component Value Date   INR 1.13 11/15/2015   BMET Lab Results  Component Value Date   NA 133 (L) 04/21/2023   K 4.1 04/21/2023   CL 100 04/21/2023   CO2 25 04/21/2023   GLUCOSE 116 (H) 04/21/2023   BUN 13 04/21/2023   CREATININE 0.98 04/21/2023   CALCIUM 8.5 (L) 04/21/2023    Studies/Results: US RENAL  Result Date: 04/20/2023 CLINICAL DATA:  AKI EXAM: RENAL / URINARY TRACT ULTRASOUND COMPLETE COMPARISON:  CT abdomen and pelvis 11/15/2015 report FINDINGS: Right Kidney: Renal measurements: 11.9 x 5.8 x 5.4 cm = volume: 197 mL. Echogenicity within normal limits. No mass or hydronephrosis visualized. Left Kidney: Renal measurements: 12.6 x 5.3 x 5.8 cm = volume: 201 mL. Echogenicity within normal limits. No mass or hydronephrosis visualized. Bladder: Appears normal for degree of bladder distention. Other: None. IMPRESSION: Unremarkable renal ultrasound. Electronically Signed   By: Minerva Fester M.D.   On: 04/20/2023 20:26    Assessment/Plan: Postop day 4 crani for SDH evacuation. Once cleared by hospitalist, we will discharge home.    LOS: 4 days    Tiana Loft [facility] 04/21/2023, 7:39 AM

## 2023-04-21 NOTE — Progress Notes (Signed)
PROGRESS NOTE    Rodney [name]  ZOX:[id number] DOB: 04/10/1946 DOA: 04/17/2023 PCP: Emilio Aspen, MD   Brief Narrative:  HPI per Dr. Mikey College on 04/20/23 Rodney [name] is a 77 y.o. male with medical history significant of HTN, HTN, chronic HFpEF, BPH, chronic left subdural hematoma presented presented with frequent falls on 7/21.  Workup in the ED found patient had enlarged left subdural hematoma and patient underwent emergency SDH evacuation on same day.  Patient tolerated procedure well.  After the procedure, patient however has had poor appetite and oral intake for Sunday and Monday, and his appetite picked up somewhat yesterday.  Denies any abdominal pain no nauseous vomiting or diarrhea.  This morning, the patient was feeling dizzy when standing up, and vital signs showed patient had hypotension the whole morning.  Afternoon patient started having visual hallucination with "Clock and TV set upside down and on the floor" but he denies any vertigo and symptoms lasted 3 to 5 minutes.  Patient also been taking around-the-clock Vicodin for surgical pain and 3 doses before this afternoon, when his blood pressure was systolic in the 80s and was given IV bolus of 1000 mL normal saline and visual hallucinations resolved.   **Interim History States he was hallucinating yesterday evening and states he had been having some hallucinations prior to admission but never "this bad." Admitted to being a daily drinker and stated that his last drink was Sunday.   Assessment and Plan:  Visual Hallucinations -Suspect multifactorial, clinically suspect commendation of hypotension as well as with ongoing narcotic usage with a possible impaired kidney function but ? Alcohol Withdrawal related -Symptoms resolved after blood pressure improved but will monitor overnight to ensure he does not hallucinate further -Recommend hold off home BP meds including amlodipine, ARB and spironolactone -Agreed with  as needed labetalol -Placed on CIWA Protocol given his Hx of Alcohol Abuse -Will hold off brain imaging for now as patient's visual hallucination resolved spontaneously yesterday after IV hydration, consider brain imaging if visual hallucinations comes back -Place on Delirium Precautions -If No hallucinations overnight and if BP is stable and patient is not withdrawing suspect he can be discharged Home with Home Health in the AM.    Hypotension -Input and output record showed the patient has a gross 2500 ml of negative in the last 2 days, implying volume contraction/dehydration, will Continued IV fluid x 12 hours and recheck kidney function and vital signs/orthostatic signs -Check Orthostatics in the the AM   AKI -On admission creatinine 1.4 compared to baseline less than 1.0 about 1 year ago, with a gross negative I&O --BUN/Cr Trend: Recent Labs  Lab 04/17/23 1203 04/20/23 1946 04/21/23 0001  BUN 26* 15 13  CREATININE 1.42* 1.00 0.98  -C/w IVF Hydration and received IVF Hydration with NS at 125 mL/hr x12 hours and reduced rate to 75 mL/hr x 12 hours -Avoid Nephrotoxic Medications, Contrast Dyes, Hypotension and Dehydration to Ensure Adequate Renal Perfusion and will need to Renally Adjust Meds -Continue to Monitor and Trend Renal Function carefully and repeat CMP in the AM   Hyponatremia -Na+ Trend: Recent Labs  Lab 04/17/23 1203 04/20/23 1946 04/21/23 0001  NA 135 133* 133*  -Continue IVF NS 75 mL/hr x12 -Continue to Monitor and Trend and Repeat CMP in the AM   History of Alcohol Abuse -Last drink was 4 Days ago and could likely to contribute to his current mentation changes given that he is within the withdrawal period. Placed on  CIWA Protocol and will monitor for any more Hallucinations   SDU status post hematoma evacuation -Stable, as per primary team  Macrocytic Anemia -Hgb/Hct Trend: Recent Labs  Lab 04/17/23 1203 04/20/23 1946 04/21/23 0001  HGB 15.0 12.6* 13.0   HCT 43.9 37.6* 39.3  MCV 102.1* 107.7* 103.7*  -Check Anemia Panel in the AM -Continue to Monitor for S/Sx of Bleeding; no overt bleeding noted -Repeat CBC in the AM  Hypoalbuminemia -Patient's Albumin Trend: Recent Labs  Lab 04/21/23 0830  ALBUMIN 2.6*  -Continue to Monitor and Trend and repeat CMP in the AM  Obesity -Complicates overall prognosis and care -Estimated body mass index is 32.08 kg/m as calculated from the following:   Height as of this encounter: 5\' 11"  (1.803 m).   Weight as of this encounter: 104.3 kg.  -Weight Loss and Dietary Counseling given   DVT prophylaxis: SCDs Start: 04/18/23 0004    Code Status: Full Code Family Communication: No family present at bedside  Disposition Plan:  Level of care: Progressive Status is: Inpatient Remains inpatient appropriate because: Was having Visual Hallucinations and if has no more hallucinations,    Consultants:  [facility] (Primary)  Procedures:  Procedure: Bur hole craniectomy for evacuation of subacute subdural hematoma. Left.   Antimicrobials:  Anti-infectives (From admission, onward)    Start     Dose/Rate Route Frequency Ordered Stop   04/18/23 0003  ceFAZolin (ANCEF) IVPB 1 g/50 mL premix        1 g 100 mL/hr over 30 Minutes Intravenous Every 8 hours 04/18/23 0003 04/18/23 0907   04/17/23 2136  ceFAZolin (ANCEF) 2-4 GM/100ML-% IVPB       Note to Pharmacy: Edmonia Caprio: cabinet override      04/17/23 2136 04/18/23 0105       Subjective: Seen and examined and he was doing ok. Had some visual hallucinations yesterday but none currently. States he is a daily drinker. No CP or SOB. States he felt dizzy still. No other complaints or concerns at this time.  Objective: Vitals:   04/21/23 0307 04/21/23 0723 04/21/23 1113 04/21/23 1459  BP: 137/76 123/73 (!) 119/99 114/72  Pulse: 68 63 71 77  Resp: 20 13 13  (!) 21  Temp:  97.7 F (36.5 C) 98 F (36.7 C) 97.7 F (36.5 C)  TempSrc:  Oral  Oral Oral  SpO2: 94% 98% 97% 94%  Weight:      Height:        Intake/Output Summary (Last 24 hours) at 04/21/2023 1820 Last data filed at 04/21/2023 1459 Gross per 24 hour  Intake 1122.41 ml  Output 3100 ml  Net -1977.59 ml   Filed Weights   04/17/23 1155  Weight: 104.3 kg   Examination: Physical Exam:  Constitutional: WN/WD, NAD and appears calm and comfortable Respiratory: Diminished to auscultation bilaterally, no wheezing, rales, rhonchi or crackles. Normal respiratory effort and patient is not tachypenic. No accessory muscle use. Unlabored breathing Cardiovascular: RRR, no murmurs / rubs / gallops. S1 and S2 auscultated. No extremity edema. Abdomen: Soft, non-tender, Distended 2/2 body habitus. Bowel sounds positive.  GU: Deferred. Musculoskeletal: No clubbing / cyanosis of digits/nails. No joint deformity upper and lower extremities.  Skin: Head Incision appears C/D/I Neurologic: CN 2-12 grossly intact with no focal deficits. Romberg sign and cerebellar reflexes not assessed.  Psychiatric: Normal judgment and insight. Alert and oriented x 3.   Data Reviewed: I have personally reviewed following labs and imaging studies  CBC: Recent Labs  Lab 04/17/23 1203 04/20/23 1946 04/21/23 0001  WBC 10.7* 9.3 9.0  HGB 15.0 12.6* 13.0  HCT 43.9 37.6* 39.3  MCV 102.1* 107.7* 103.7*  PLT 275 253 262   Basic Metabolic Panel: Recent Labs  Lab 04/17/23 1203 04/20/23 1946 04/21/23 0001 04/21/23 0830  NA 135 133* 133*  --   K 4.6 4.1 4.1  --   CL 96* 101 100  --   CO2 27 22 25   --   GLUCOSE 107* 124* 116*  --   BUN 26* 15 13  --   CREATININE 1.42* 1.00 0.98  --   CALCIUM 10.2 8.2* 8.5*  --   MG  --   --   --  1.9  PHOS  --   --   --  2.9   GFR: Estimated Creatinine Clearance: 77.6 mL/min (by C-G formula based on SCr of 0.98 mg/dL). Liver Function Tests: Recent Labs  Lab 04/21/23 0830  AST 21  ALT 15  ALKPHOS 31*  BILITOT 0.5  PROT 5.8*  ALBUMIN 2.6*   No  results for input(s): "LIPASE", "AMYLASE" in the last 168 hours. No results for input(s): "AMMONIA" in the last 168 hours. Coagulation Profile: No results for input(s): "INR", "PROTIME" in the last 168 hours. Cardiac Enzymes: No results for input(s): "CKTOTAL", "CKMB", "CKMBINDEX", "TROPONINI" in the last 168 hours. BNP (last 3 results) No results for input(s): "PROBNP" in the last 8760 hours. HbA1C: No results for input(s): "HGBA1C" in the last 72 hours. CBG: No results for input(s): "GLUCAP" in the last 168 hours. Lipid Profile: No results for input(s): "CHOL", "HDL", "LDLCALC", "TRIG", "CHOLHDL", "LDLDIRECT" in the last 72 hours. Thyroid Function Tests: No results for input(s): "TSH", "T4TOTAL", "FREET4", "T3FREE", "THYROIDAB" in the last 72 hours. Anemia Panel: No results for input(s): "VITAMINB12", "FOLATE", "FERRITIN", "TIBC", "IRON", "RETICCTPCT" in the last 72 hours. Sepsis Labs: Recent Labs  Lab 04/20/23 1946 04/21/23 0001  LATICACIDVEN 1.6 1.1    Recent Results (from the past 240 hour(s))  Surgical pcr screen     Status: None   Collection Time: 04/17/23  6:53 PM   Specimen: Nasal Mucosa; Nasal Swab  Result Value Ref Range Status   MRSA, PCR NEGATIVE NEGATIVE Final   Staphylococcus aureus NEGATIVE NEGATIVE Final    Comment: (NOTE) The Xpert SA Assay (FDA approved for NASAL specimens in patients 37 years of age and older), is one component of a comprehensive surveillance program. It is not intended to diagnose infection nor to guide or monitor treatment. Performed at [facility], [address] [place], [place] [address]     Radiology Studies: US RENAL  Result Date: 04/20/2023 CLINICAL DATA:  AKI EXAM: RENAL / URINARY TRACT ULTRASOUND COMPLETE COMPARISON:  CT abdomen and pelvis 11/15/2015 report FINDINGS: Right Kidney: Renal measurements: 11.9 x 5.8 x 5.4 cm = volume: 197 mL. Echogenicity within normal limits. No mass or hydronephrosis visualized. Left  Kidney: Renal measurements: 12.6 x 5.3 x 5.8 cm = volume: 201 mL. Echogenicity within normal limits. No mass or hydronephrosis visualized. Bladder: Appears normal for degree of bladder distention. Other: None. IMPRESSION: Unremarkable renal ultrasound. Electronically Signed   By: Minerva Fester M.D.   On: 04/20/2023 20:26    Scheduled Meds:  acetaminophen  1,000 mg Oral Once   aspirin EC  162 mg Oral QHS   B-complex with vitamin C  1 tablet Oral Daily   Chlorhexidine Gluconate Cloth  6 each Topical Daily   cholecalciferol  1,000 Units  Oral Daily   cyanocobalamin  1,000 mcg Oral Daily   DULoxetine  60 mg Oral QHS   ezetimibe  10 mg Oral Daily   finasteride  5 mg Oral Daily   fluticasone furoate-vilanterol  1 puff Inhalation Daily   And   umeclidinium bromide  1 puff Inhalation Daily   folic acid  1 mg Oral Daily   levETIRAcetam  500 mg Oral BID   magnesium oxide  400 mg Oral QODAY   metoprolol succinate  50 mg Oral Daily   multivitamin with minerals  1 tablet Oral Daily   pantoprazole  40 mg Oral Daily   polyethylene glycol  17 g Oral Daily   pravastatin  80 mg Oral QPM   pyridOXINE  100 mg Oral Daily   senna  1 tablet Oral BID   tamsulosin  0.4 mg Oral QHS   thiamine  100 mg Oral Daily   Or   thiamine  100 mg Intravenous Daily   Continuous Infusions:  sodium chloride      LOS: 4 days   Marguerita Merles, DO [facility] Available via Epic secure chat 7am-7pm After these hours, please refer to coverage provider listed on [email or web address] 04/21/2023, 6:20 PM

## 2023-04-21 NOTE — Plan of Care (Signed)
?  Problem: Clinical Measurements: ?Goal: Will remain free from infection ?Outcome: Progressing ?  ?

## 2023-04-21 NOTE — Progress Notes (Signed)
Occupational Therapy Treatment Patient Details Name: JACEYON STROLE MRN: [id number] DOB: 14-Sep-1946 Today's Date: 04/21/2023   History of present illness Pt is a 77 y.o. M who presents 04/17/2023 with increased frequency of falls and dizziness.  CT head shows increase in size of left sided SDH now measuring 26mm in size compared to 16mm on his last head CT a couple weeks ago. He also has increased midline shift now. S/p [name] hole craniectomy for evacuation of subacute subdural hematoma 7/21. Significant PMH: COPD, TIA, R THA, L SDH.   OT comments  Pt. Seen for skilled OT treatment session.  Able to complete bed mobility hob flat, no rails with mod a for trunk support.  Recliner likely still the best option for management of rib pain.  Able to ambulate with cane and gather clothing items from cabinet and return to recliner for lb dressing min guard a/s.  Light cues for hand placement when transitioning into sitting.  Reviewed PLB and importance of implementation during functional mobility and adls.  Cues for rest breaks and initiation of the breathing strategies but pt. Receptive and had good results when utilized.    Orthostatic BPs during session:  Supine: 122/81-92 Sit: 117/92-99 Stand: 104/66-79 Seated after activity: 116/82-94 Seated after 5 min.: 129/84-98  Pt. On RA during session: lowest dip to 78% with PLBx2 (in/outs) rebound to 92% otherwise around 88-94% for duration of session   Recommendations for follow up therapy are one component of a multi-disciplinary discharge planning process, led by the attending physician.  Recommendations may be updated based on patient status, additional functional criteria and insurance authorization.    Assistance Recommended at Discharge Frequent or constant Supervision/Assistance  Patient can return home with the following  Assistance with cooking/housework;A lot of help with bathing/dressing/bathroom;Direct supervision/assist for medications  management;Direct supervision/assist for financial management;Assist for transportation   Equipment Recommendations  Tub/shower seat    Recommendations for Other Services      Precautions / Restrictions Precautions Precautions: Fall;Other (comment) Precaution Comments: watch BP Restrictions Weight Bearing Restrictions: No       Mobility Bed Mobility Overal bed mobility: Needs Assistance Bed Mobility: Supine to Sit     Supine to sit: Mod assist     General bed mobility comments: heavy trunk support required due to rib pain but had pt. attempt with hob flat, no rails to simulate home env. in the event he was in reg. bed at home vs. recliner    Transfers Overall transfer level: Needs assistance Equipment used: Straight cane Transfers: Sit to/from Stand, Bed to chair/wheelchair/BSC Sit to Stand: Min guard Stand pivot transfers: Min guard   Step pivot transfers: Min guard     General transfer comment: Min guard for safety, cues for hand placement when transitioning to sitting     Balance                                           ADL either performed or assessed with clinical judgement   ADL Overall ADL's : Needs assistance/impaired                     Lower Body Dressing: Sit to/from stand;Supervision/safety   Toilet Transfer: Min guard;Ambulation (ambulated with spc) Toilet Transfer Details (indicate cue type and reason): observed during in room ambulation bed to cabinet then to recliner  Functional mobility during ADLs: Supervision/safety General ADL Comments: able to ambulate to cabinet with min guard a/s to gather clothes.  ambulated back to recliner to don underwear sit/stand.  cues for rest breaks when needed along with plb.  on RA throughout session. ranging from 78 at lowest to 94% rebounds quickly with breathing strategies.    Extremity/Trunk Assessment              Vision       Perception     Praxis       Cognition Arousal/Alertness: Awake/alert Behavior During Therapy: WFL for tasks assessed/performed Overall Cognitive Status: Within Functional Limits for tasks assessed                                 General Comments: states "im aware i was halucinating" when asked more about it he states he feels a lot better today.  able to state his name, where he was, why he was here. answering all questions appropriately. following commands consistently        Exercises      Shoulder Instructions       General Comments      Pertinent Vitals/ Pain       Pain Assessment Pain Assessment: No/denies pain  Home Living                                          Prior Functioning/Environment              Frequency  Min 1X/week        Progress Toward Goals  OT Goals(current goals can now be found in the care plan section)  Progress towards OT goals: Progressing toward goals     Plan Discharge plan remains appropriate    Co-evaluation                 AM-PAC OT "6 Clicks" Daily Activity     Outcome Measure   Help from another person eating meals?: None Help from another person taking care of personal grooming?: A Little Help from another person toileting, which includes using toliet, bedpan, or urinal?: A Little Help from another person bathing (including washing, rinsing, drying)?: A Little Help from another person to put on and taking off regular upper body clothing?: A Little Help from another person to put on and taking off regular lower body clothing?: A Little 6 Click Score: 19    End of Session Equipment Utilized During Treatment: Gait belt;Other (comment) (cane)  OT Visit Diagnosis: Unsteadiness on feet (R26.81);Repeated falls (R29.6);Other symptoms and signs involving cognitive function   Activity Tolerance Patient tolerated treatment well   Patient Left in chair;with call bell/phone within reach;with chair alarm set    Nurse Communication Other (comment) (reviewed with RN pts. o2 status on RA and no current hallucinations)        Time: 6440-3474 OT Time Calculation (min): 32 min  Charges: OT General Charges $OT Visit: 1 Visit OT Treatments $Self Care/Home Management : 23-37 mins  Boneta Lucks, COTA/L Acute Rehabilitation [phone]   Alessandra Bevels Lorraine-COTA/L 04/21/2023, 10:44 AM

## 2023-04-22 DIAGNOSIS — F10932 Alcohol use, unspecified with withdrawal with perceptual disturbance: Secondary | ICD-10-CM | POA: Diagnosis not present

## 2023-04-22 DIAGNOSIS — R42 Dizziness and giddiness: Secondary | ICD-10-CM

## 2023-04-22 DIAGNOSIS — S065XAA Traumatic subdural hemorrhage with loss of consciousness status unknown, initial encounter: Secondary | ICD-10-CM | POA: Diagnosis not present

## 2023-04-22 LAB — URINALYSIS, ROUTINE W REFLEX MICROSCOPIC
Bilirubin Urine: NEGATIVE
Glucose, UA: NEGATIVE mg/dL
Hgb urine dipstick: NEGATIVE
Ketones, ur: NEGATIVE mg/dL
Leukocytes,Ua: NEGATIVE
Nitrite: NEGATIVE
Protein, ur: NEGATIVE mg/dL
Specific Gravity, Urine: 1.01 (ref 1.005–1.030)
pH: 8 (ref 5.0–8.0)

## 2023-04-22 MED ORDER — SULFAMETHOXAZOLE-TRIMETHOPRIM 800-160 MG PO TABS
1.0000 | ORAL_TABLET | Freq: Two times a day (BID) | ORAL | Status: DC
  Administered 2023-04-22 – 2023-04-23 (×3): 1 via ORAL
  Filled 2023-04-22 (×4): qty 1

## 2023-04-22 MED ORDER — PHENAZOPYRIDINE HCL 100 MG PO TABS
100.0000 mg | ORAL_TABLET | Freq: Three times a day (TID) | ORAL | Status: DC
  Administered 2023-04-22 – 2023-04-23 (×3): 100 mg via ORAL
  Filled 2023-04-22 (×4): qty 1

## 2023-04-22 NOTE — Progress Notes (Signed)
PROGRESS NOTE    Rodney [name]  WUJ:[id number] DOB: 10-26-1945 DOA: 04/17/2023 PCP: Emilio Aspen, MD   Brief Narrative:  HPI per Dr. Mikey College on 04/20/23 Rodney [name] is a 77 y.o. male with medical history significant of HTN, HTN, chronic HFpEF, BPH, chronic left subdural hematoma presented presented with frequent falls on 7/21.  Workup in the ED found patient had enlarged left subdural hematoma and patient underwent emergency SDH evacuation on same day.  Patient tolerated procedure well.  After the procedure, patient however has had poor appetite and oral intake for Sunday and Monday, and his appetite picked up somewhat yesterday.  Denies any abdominal pain no nauseous vomiting or diarrhea.  This morning, the patient was feeling dizzy when standing up, and vital signs showed patient had hypotension the whole morning.  Afternoon patient started having visual hallucination with "Clock and TV set upside down and on the floor" but he denies any vertigo and symptoms lasted 3 to 5 minutes.  Patient also been taking around-the-clock Vicodin for surgical pain and 3 doses before this afternoon, when his blood pressure was systolic in the 80s and was given IV bolus of 1000 mL normal saline and visual hallucinations resolved.   **Interim History States he was hallucinating and states he had been having some hallucinations prior to admission but never "this bad." Admitted to being a daily drinker (heavy drinking and approximately 8 shots a day) and stated that his last drink was Sunday. He feels his hallucinations are 2/2 to his daily drinking. He hallucinated yesterday afternoon very briefly. Awaiting Orthostatic VS but states dizziness is improving. Now complaining of Urinary Burning and agree with obtaining a U/A. If CIWA Scores consistently <5 and Hallucinations improve, he can be discharged from a Medicine standpoint.   Assessment and Plan:  Visual Hallucinations in the Setting of EtOH  Withdrawal -Suspect multifactorial, clinically suspect commendation of hypotension as well as with ongoing narcotic usage with a possible impaired kidney function but most likely Alcohol Withdrawal related given his heavy alcohol abuse -Symptoms resolved after blood pressure improved but will monitor overnight to ensure he does not hallucinate further and had a brief episode on 04/21/23 -Recommend hold off home BP meds including amlodipine, ARB and spironolactone -Agreed with as needed labetalol -Placed on CIWA Protocol given his Hx of Alcohol Abuse -Will hold off brain imaging for now as patient's visual hallucination resolved spontaneously yesterday after IV hydration, consider brain imaging if visual hallucinations comes back -Place on Delirium Precautions -If No hallucinations overnight and if BP is stable and patient is not withdrawing suspect he can be discharged Home with Home Health in the AM. Ensure CIWA Scores are less than 5 consistently    Hypotension -Input and output record showed the patient has a gross 2500 ml of negative in the last 2 days, implying volume contraction/dehydration, will Continued IV fluid x 12 hours and recheck kidney function and vital signs/orthostatic signs -Check Orthostatics in the the AM and pending    AKI, improved  -On admission creatinine 1.4 compared to baseline less than 1.0 about 1 year ago, with a gross negative I&O --BUN/Cr Trend: Recent Labs  Lab 04/17/23 1203 04/20/23 1946 04/21/23 0001 04/22/23 0409  BUN 26* 15 13 13   CREATININE 1.42* 1.00 0.98 0.93  -C/w IVF Hydration and received IVF Hydration with NS at 125 mL/hr x12 hours and reduced rate to 75 mL/hr x 12 hours -Avoid Nephrotoxic Medications, Contrast Dyes, Hypotension and Dehydration to Ensure Adequate  Renal Perfusion and will need to Renally Adjust Meds -Continue to Monitor and Trend Renal Function carefully and repeat CMP in the AM   Hyponatremia -Na+ Trend: Recent Labs  Lab  04/17/23 1203 04/20/23 1946 04/21/23 0001 04/22/23 0409  NA 135 133* 133* 133*  -IVF now stopped. ? If Hyponatremia is related to Alcoholism and Potomania  -Continue to Monitor and Trend and Repeat CMP in the AM   History of Alcohol Abuse with Withdrawal Concern -Last drink was 4 Days ago and could likely to contribute to his current mentation changes given that he is within the withdrawal period. Placed on CIWA Protocol and will monitor for any more Hallucinations  Urinary Burning -Agree with Obtaining a U/A -U/A Unremarkable and likely does not need Abx given No Leukocytes, Nitrites; Reflex not sent  -Trial of Pyridium   SDU status post hematoma evacuation -Stable, as per primary team  Macrocytic Anemia -Hgb/Hct Trend: Recent Labs  Lab 04/17/23 1203 04/20/23 1946 04/21/23 0001 04/22/23 0409  HGB 15.0 12.6* 13.0 12.3*  HCT 43.9 37.6* 39.3 36.8*  MCV 102.1* 107.7* 103.7* 101.7*  -Check Anemia Panel in the AM -Continue to Monitor for S/Sx of Bleeding; no overt bleeding noted -Repeat CBC in the AM  Hypoalbuminemia -Patient's Albumin Trend: Recent Labs  Lab 04/21/23 0830 04/22/23 0409  ALBUMIN 2.6* 2.7*  -Continue to Monitor and Trend and repeat CMP in the AM  Obesity -Complicates overall prognosis and care -Estimated body mass index is 32.08 kg/m as calculated from the following:   Height as of this encounter: 5\' 11"  (1.803 m).   Weight as of this encounter: 104.3 kg.  -Weight Loss and Dietary Counseling given   DVT prophylaxis: SCDs Start: 04/18/23 0004    Code Status: Full Code Family Communication: No family currently at bedside  Disposition Plan:  Level of care: Progressive Status is: Inpatient Remains inpatient appropriate because: Repeat orthostatic vital signs are pending.  He continues to hallucinate mildly.  Hallucinations are secondary to alcohol withdrawal likely.  If his CIWA scores are less than 5 consistently and he is not hallucinating and  orthostatics are normal he can be discharged from a medicine standpoint.     Consultants:  [facility] Neurosurgery  Procedures:  Procedure: Bur hole craniectomy for evacuation of subacute subdural hematoma. Left.   Antimicrobials:  Anti-infectives (From admission, onward)    Start     Dose/Rate Route Frequency Ordered Stop   04/22/23 1130  sulfamethoxazole-trimethoprim (BACTRIM DS) 800-160 MG per tablet 1 tablet        1 tablet Oral Every 12 hours 04/22/23 1036     04/18/23 0003  ceFAZolin (ANCEF) IVPB 1 g/50 mL premix        1 g 100 mL/hr over 30 Minutes Intravenous Every 8 hours 04/18/23 0003 04/18/23 0907   04/17/23 2136  ceFAZolin (ANCEF) 2-4 GM/100ML-% IVPB       Note to Pharmacy: Edmonia Caprio: cabinet override      04/17/23 2136 04/18/23 0105       Subjective: Seen and examined at bedside and he is doing better today.  States that he hallucinated briefly yesterday afternoon.  Thinks that his hallucinations are tied to his alcohol withdrawal given that he is a very heavy drinker.  States that his dizziness is getting better but orthostatics have not been done yet.  Now complaining about some burning in his urine.  No other concerns or complaints at this time.  Objective: Vitals:   04/22/23 0844 04/22/23  1118 04/22/23 1200 04/22/23 1500  BP:  129/76 114/70 122/72  Pulse: 68 71 71 (!) 59  Resp: 16 14 14 20   Temp:  97.8 F (36.6 C)  97.7 F (36.5 C)  TempSrc:  Oral  Oral  SpO2: 95% 93% 90% 93%  Weight:      Height:        Intake/Output Summary (Last 24 hours) at 04/22/2023 1538 Last data filed at 04/22/2023 1200 Gross per 24 hour  Intake 675 ml  Output 2450 ml  Net -1775 ml   Filed Weights   04/17/23 1155  Weight: 104.3 kg   Examination: Physical Exam:  Constitutional: WN/WD no acute distress appears calm and comfortable Respiratory: Diminished to auscultation bilaterally, no wheezing, rales, rhonchi or crackles. Normal respiratory effort and patient is not  tachypenic. No accessory muscle use.  Unlabored breathing Cardiovascular: RRR, no murmurs / rubs / gallops. S1 and S2 auscultated. No extremity edema Abdomen: Soft, non-tender, distended secondary to body habitus. Bowel sounds positive.  GU: Deferred. Musculoskeletal: No clubbing / cyanosis of digits/nails. No joint deformity upper and lower extremities.  Skin: No rashes, lesions, ulcers but has had incision appears clean dry and intact Neurologic: CN 2-12 grossly intact with no focal deficits.  Is slightly tremulous Psychiatric: Normal judgment and insight. Alert and oriented x 3.   Data Reviewed: I have personally reviewed following labs and imaging studies  CBC: Recent Labs  Lab 04/17/23 1203 04/20/23 1946 04/21/23 0001 04/22/23 0409  WBC 10.7* 9.3 9.0 8.9  NEUTROABS  --   --   --  6.9  HGB 15.0 12.6* 13.0 12.3*  HCT 43.9 37.6* 39.3 36.8*  MCV 102.1* 107.7* 103.7* 101.7*  PLT 275 253 262 308   Basic Metabolic Panel: Recent Labs  Lab 04/17/23 1203 04/20/23 1946 04/21/23 0001 04/21/23 0830 04/22/23 0409  NA 135 133* 133*  --  133*  K 4.6 4.1 4.1  --  4.1  CL 96* 101 100  --  98  CO2 27 22 25   --  25  GLUCOSE 107* 124* 116*  --  99  BUN 26* 15 13  --  13  CREATININE 1.42* 1.00 0.98  --  0.93  CALCIUM 10.2 8.2* 8.5*  --  8.7*  MG  --   --   --  1.9 1.8  PHOS  --   --   --  2.9 3.2   GFR: Estimated Creatinine Clearance: 81.8 mL/min (by C-G formula based on SCr of 0.93 mg/dL). Liver Function Tests: Recent Labs  Lab 04/21/23 0830 04/22/23 0409  AST 21 25  ALT 15 22  ALKPHOS 31* 30*  BILITOT 0.5 0.6  PROT 5.8* 6.2*  ALBUMIN 2.6* 2.7*   No results for input(s): "LIPASE", "AMYLASE" in the last 168 hours. No results for input(s): "AMMONIA" in the last 168 hours. Coagulation Profile: No results for input(s): "INR", "PROTIME" in the last 168 hours. Cardiac Enzymes: No results for input(s): "CKTOTAL", "CKMB", "CKMBINDEX", "TROPONINI" in the last 168 hours. BNP  (last 3 results) No results for input(s): "PROBNP" in the last 8760 hours. HbA1C: No results for input(s): "HGBA1C" in the last 72 hours. CBG: No results for input(s): "GLUCAP" in the last 168 hours. Lipid Profile: No results for input(s): "CHOL", "HDL", "LDLCALC", "TRIG", "CHOLHDL", "LDLDIRECT" in the last 72 hours. Thyroid Function Tests: No results for input(s): "TSH", "T4TOTAL", "FREET4", "T3FREE", "THYROIDAB" in the last 72 hours. Anemia Panel: No results for input(s): "VITAMINB12", "FOLATE", "FERRITIN", "TIBC", "IRON", "RETICCTPCT"  in the last 72 hours. Sepsis Labs: Recent Labs  Lab 04/20/23 1946 04/21/23 0001  LATICACIDVEN 1.6 1.1   Recent Results (from the past 240 hour(s))  Surgical pcr screen     Status: None   Collection Time: 04/17/23  6:53 PM   Specimen: Nasal Mucosa; Nasal Swab  Result Value Ref Range Status   MRSA, PCR NEGATIVE NEGATIVE Final   Staphylococcus aureus NEGATIVE NEGATIVE Final    Comment: (NOTE) The Xpert SA Assay (FDA approved for NASAL specimens in patients 74 years of age and older), is one component of a comprehensive surveillance program. It is not intended to diagnose infection nor to guide or monitor treatment. Performed at [facility], [address] [place], [place] [address]     Radiology Studies: US RENAL  Result Date: 04/20/2023 CLINICAL DATA:  AKI EXAM: RENAL / URINARY TRACT ULTRASOUND COMPLETE COMPARISON:  CT abdomen and pelvis 11/15/2015 report FINDINGS: Right Kidney: Renal measurements: 11.9 x 5.8 x 5.4 cm = volume: 197 mL. Echogenicity within normal limits. No mass or hydronephrosis visualized. Left Kidney: Renal measurements: 12.6 x 5.3 x 5.8 cm = volume: 201 mL. Echogenicity within normal limits. No mass or hydronephrosis visualized. Bladder: Appears normal for degree of bladder distention. Other: None. IMPRESSION: Unremarkable renal ultrasound. Electronically Signed   By: Minerva Fester M.D.   On: 04/20/2023 20:26     Scheduled Meds:  acetaminophen  1,000 mg Oral Once   aspirin EC  162 mg Oral QHS   B-complex with vitamin C  1 tablet Oral Daily   Chlorhexidine Gluconate Cloth  6 each Topical Daily   cholecalciferol  1,000 Units Oral Daily   cyanocobalamin  1,000 mcg Oral Daily   DULoxetine  60 mg Oral QHS   ezetimibe  10 mg Oral Daily   finasteride  5 mg Oral Daily   fluticasone furoate-vilanterol  1 puff Inhalation Daily   And   umeclidinium bromide  1 puff Inhalation Daily   folic acid  1 mg Oral Daily   levETIRAcetam  500 mg Oral BID   magnesium oxide  400 mg Oral QODAY   metoprolol succinate  50 mg Oral Daily   multivitamin with minerals  1 tablet Oral Daily   pantoprazole  40 mg Oral Daily   phenazopyridine  100 mg Oral TID WC   polyethylene glycol  17 g Oral Daily   pravastatin  80 mg Oral QPM   pyridOXINE  100 mg Oral Daily   senna  1 tablet Oral BID   sulfamethoxazole-trimethoprim  1 tablet Oral Q12H   tamsulosin  0.4 mg Oral QHS   thiamine  100 mg Oral Daily   Or   thiamine  100 mg Intravenous Daily   Continuous Infusions:   LOS: 5 days   Marguerita Merles, DO [facility] Available via Epic secure chat 7am-7pm After these hours, please refer to coverage provider listed on [email or web address] 04/22/2023, 3:38 PM

## 2023-04-22 NOTE — Progress Notes (Signed)
Subjective: Patient reports  overall doing okay but complains of dysuria and still occasional dizziness  Objective: Vital signs in last 24 hours: Temp:  [97.7 F (36.5 C)-98.5 F (36.9 C)] 98.1 F (36.7 C) (07/26 0747) Pulse Rate:  [62-77] 68 (07/26 0844) Resp:  [13-21] 16 (07/26 0844) BP: (114-135)/(72-99) 119/79 (07/26 0747) SpO2:  [93 %-97 %] 95 % (07/26 0844)  Intake/Output from previous day: 07/25 0701 - 07/26 0700 In: 675 [I.V.:675] Out: 2150 [Urine:2150] Intake/Output this shift: Total I/O In: -  Out: 500 [Urine:500]  Awake alert neurologically nonfocal moves all extremities well with equal strength  Lab Results: Recent Labs    04/21/23 0001 04/22/23 0409  WBC 9.0 8.9  HGB 13.0 12.3*  HCT 39.3 36.8*  PLT 262 308   BMET Recent Labs    04/21/23 0001 04/22/23 0409  NA 133* 133*  K 4.1 4.1  CL 100 98  CO2 25 25  GLUCOSE 116* 99  BUN 13 13  CREATININE 0.98 0.93  CALCIUM 8.5* 8.7*    Studies/Results: US RENAL  Result Date: 04/20/2023 CLINICAL DATA:  AKI EXAM: RENAL / URINARY TRACT ULTRASOUND COMPLETE COMPARISON:  CT abdomen and pelvis 11/15/2015 report FINDINGS: Right Kidney: Renal measurements: 11.9 x 5.8 x 5.4 cm = volume: 197 mL. Echogenicity within normal limits. No mass or hydronephrosis visualized. Left Kidney: Renal measurements: 12.6 x 5.3 x 5.8 cm = volume: 201 mL. Echogenicity within normal limits. No mass or hydronephrosis visualized. Bladder: Appears normal for degree of bladder distention. Other: None. IMPRESSION: Unremarkable renal ultrasound. Electronically Signed   By: Minerva Fester M.D.   On: 04/20/2023 20:26    Assessment/Plan: Status post bur hole craniectomy day 5 making progress still had an episode of dizziness but improving is complaining of burning on urination urinalysis did show evidence of urinary tract infection start him on Bactrim we will have him evaluated by medical service patient can be mobilized with physical Occupational  Therapy discharge when cleared from medicine  LOS: 5 days     Rodney [name] 04/22/2023, 10:36 AM

## 2023-04-23 DIAGNOSIS — R42 Dizziness and giddiness: Secondary | ICD-10-CM | POA: Diagnosis not present

## 2023-04-23 DIAGNOSIS — S065XAA Traumatic subdural hemorrhage with loss of consciousness status unknown, initial encounter: Secondary | ICD-10-CM | POA: Diagnosis not present

## 2023-04-23 DIAGNOSIS — F10932 Alcohol use, unspecified with withdrawal with perceptual disturbance: Secondary | ICD-10-CM | POA: Diagnosis not present

## 2023-04-23 MED ORDER — LEVETIRACETAM 500 MG PO TABS: 500.0000 mg | ORAL_TABLET | Freq: Two times a day (BID) | ORAL | 3 refills | Status: DC

## 2023-04-23 NOTE — TOC Transition Note (Signed)
Transition of Care [facility]) - CM/SW Discharge Note   Patient Details  Name: Rodney [name] MRN: [id number] Date of Birth: 07-07-1946  Transition of Care [facility] CM/SW Contact:  Ronny Bacon, RN Phone Number: 04/23/2023, 1:42 PM   Clinical Narrative:   Patient is being discharged home today. Laurelyn Sickle with [facility] confirmed having patient on their list. Asked rounding provider to place [facility] PT/OT orders with face to face.     Final next level of care: Home w Home Health Services Barriers to Discharge: No Barriers Identified   Patient Goals and CMS Choice   Choice offered to / list presented to : Spouse  Discharge Placement                         Discharge Plan and Services Additional resources added to the After Visit Summary for   In-house Referral: NA Discharge Planning Services: CM Consult Post Acute Care Choice: Home Health          DME Arranged: N/A DME Agency:  (no DME needed/ no agency contacted)       HH Arranged: PT, OT HH Agency: [facility] Date HH Agency Contacted: 04/20/23 Time HH Agency Contacted: 0908 Representative spoke with at [facility] Agency: Tresa Endo  Social Determinants of Health (SDOH) Interventions SDOH Screenings   Food Insecurity: Low Risk  (04/15/2023)   Received from [facility]  Utilities: Low Risk  (04/15/2023)   Received from [facility]  Tobacco Use: High Risk (04/17/2023)     Readmission Risk Interventions     No data to display

## 2023-04-23 NOTE — Discharge Summary (Signed)
Physician Discharge Summary  Patient ID: Rodney [name] MRN: [id number] DOB/AGE: [birth year] 77 y.o.  Admit date: 04/17/2023 Discharge date: 04/23/2023  Admission Diagnoses: Left hemispheric subdural hematoma chronic.  Alcohol abuse  Discharge Diagnoses: Left hemispheric subdural hematoma chronic.  Alcohol abuse.  Delirium tremens. Principal Problem:   Subdural hematoma (HCC) Active Problems:   SDH (subdural hematoma) (HCC)   Discharged Condition: fair  Hospital Course: Patient was admitted with a large holohemispheric left-sided subdural hematoma.  He required bur hole drainage.  He improved clinically.  Patient was noted to have a significant history of chronic daily alcohol intake.  He admits to 8 drinks a day.  He became confused on the second day postop.  This was secondary to alcohol withdrawal as he had not had a drink in approximately 4 days.  He was treated with some Ativan which allowed him to recover and become coherent.  He has been advised regarding alcohol cessation.  He does not appear to be ready to quit completely.  He has his own plan for a program to cut down.  We discussed how generally this is ineffective.  Consults:  Hospitalist  Significant Diagnostic Studies: None  Treatments: surgery: See op note  Discharge Exam: Blood pressure (!) 125/106, pulse 61, temperature 98 F (36.7 C), temperature source Axillary, resp. rate (!) 22, height 5\' 11"  (1.803 m), weight 104.3 kg, SpO2 94%. Incisions on scalp are clean and dry on the left side.  Has been advised regarding follow-up for suture removal.  On examination his Station and gait are intact.  He walks with a moderately wide-based gait.  He has some mild unsteadiness.  Orthostatics are within normal limits  Disposition: Discharge disposition: 01-Home or Self Care       Discharge Instructions     Call MD for:  redness, tenderness, or signs of infection (pain, swelling, redness, odor or green/yellow discharge  around incision site)   Complete by: As directed    Call MD for:  severe uncontrolled pain   Complete by: As directed    Call MD for:  temperature >100.4   Complete by: As directed    Diet - low sodium heart healthy   Complete by: As directed    Discharge wound care:   Complete by: As directed    Okay to shower. Do not apply salves or appointments to incision. No heavy lifting with the upper extremities greater than 10 pounds. No driving .   Increase activity slowly   Complete by: As directed       Allergies as of 04/23/2023       Reactions   Ace Inhibitors Cough   Rosuvastatin Calcium Other (See Comments)        Medication List     TAKE these medications    albuterol 108 (90 Base) MCG/ACT inhaler Commonly known as: VENTOLIN HFA Inhale 2 puffs into the lungs every 6 (six) hours as needed for wheezing or shortness of breath.   amLODipine 10 MG tablet Commonly known as: NORVASC Take 10 mg by mouth at bedtime.   aspirin EC 81 MG tablet Take 162 mg by mouth at bedtime.   B-complex with vitamin C tablet Take 1 tablet by mouth daily.   cholecalciferol 1000 units tablet Commonly known as: VITAMIN D Take 1,000 Units by mouth daily.   Cinnamon 500 MG Tabs Take 500 mg by mouth in the morning and at bedtime.   CoQ10 100 MG Caps Take 100 mg by mouth daily.  cyanocobalamin 1000 MCG tablet Commonly known as: VITAMIN B12 Take 1,000 mcg by mouth daily.   cyclobenzaprine 10 MG tablet Commonly known as: FLEXERIL TAKE 1 TABLET BY MOUTH THREE TIMES A DAY AS NEEDED FOR MUSCLE SPASMS What changed: See the new instructions.   DULoxetine 60 MG capsule Commonly known as: CYMBALTA Take 60 mg by mouth at bedtime.   ezetimibe 10 MG tablet Commonly known as: ZETIA TAKE 1 TABLET BY MOUTH EVERY DAY   finasteride 5 MG tablet Commonly known as: PROSCAR Take 5 mg by mouth daily.   levETIRAcetam 500 MG tablet Commonly known as: KEPPRA Take 1 tablet (500 mg total) by mouth 2  (two) times daily.   LORazepam 0.5 MG tablet Commonly known as: ATIVAN Take 0.5 mg by mouth as needed for anxiety.   Magnesium 500 MG Tabs Take 1 tablet by mouth every other day.   metoprolol succinate 50 MG 24 hr tablet Commonly known as: TOPROL-XL Take 50 mg by mouth daily.   Milk Thistle 500 MG Caps Take 1 capsule by mouth 2 (two) times daily.   multivitamin with minerals Tabs tablet Take 1 tablet by mouth daily.   Nexletol 180 MG Tabs Generic drug: Bempedoic Acid Take 1 tablet (180 mg total) by mouth daily.   niacinamide 500 MG tablet Take 500 mg by mouth 2 (two) times daily with a meal.   pantoprazole 40 MG tablet Commonly known as: PROTONIX Take 40 mg by mouth See admin instructions. DAILY EXCEPT Tuesday & Thursday.   Potassium 99 MG Tabs Take 1 tablet by mouth daily.   pravastatin 80 MG tablet Commonly known as: PRAVACHOL Take 1 tablet (80 mg total) by mouth every evening.   PROBIOTIC DAILY PO Take 1 tablet by mouth every evening.   pyridOXINE 100 MG tablet Commonly known as: VITAMIN B6 Take 100 mg by mouth daily.   spironolactone 25 MG tablet Commonly known as: ALDACTONE Take 25 mg by mouth daily.   tamsulosin 0.4 MG Caps capsule Commonly known as: FLOMAX Take 0.4 mg by mouth every evening.   Trelegy Ellipta 100-62.5-25 MCG/ACT Aepb Generic drug: Fluticasone-Umeclidin-Vilant TAKE 1 PUFF BY MOUTH EVERY DAY What changed: See the new instructions.   Trelegy Ellipta 100-62.5-25 MCG/ACT Aepb Generic drug: Fluticasone-Umeclidin-Vilant Inhale 1 each into the lungs daily. What changed: Another medication with the same name was changed. Make sure you understand how and when to take each.   valsartan-hydrochlorothiazide 320-12.5 MG tablet Commonly known as: DIOVAN-HCT Take 1 tablet by mouth daily.               Discharge Care Instructions  (From admission, onward)           Start     Ordered   04/23/23 0000  Discharge wound care:        Comments: Okay to shower. Do not apply salves or appointments to incision. No heavy lifting with the upper extremities greater than 10 pounds. No driving .   81/19/14 7829            Follow-up Information     [facility] Follow up.   Specialty: Home Health Services Why: Someone from [facility] will contact you to arrange start date and time for your Home Health Physical Therapy. Contact information: [address] [place] [address] [phone]                 Signed: Stefani Dama 04/23/2023, 12:51 PM

## 2023-04-23 NOTE — Progress Notes (Signed)
Patient ID: Rodney [name], male   DOB: [birth year], 77 y.o.   MRN: [id number] Patient is alert and awake and oriented today.  He is eager to go home.  Formal orthostatic blood pressures have not been obtained yet however yes if they are okay could he go home given his good overall level of functioning I believe that he could go home I discussed his alcohol usage with him he notes that he knows he needs to cut down and I advised that he really needs to stop is cutting down never seems to be a good option for individuals been drinking for long periods of time.  He notes that his last drink was a week ago.  He has been offered support to stop drinking however it appears that he has his own agenda regarding his self-care and alcohol needs and usage.  Will write tentative discharge.

## 2023-04-23 NOTE — Progress Notes (Signed)
PROGRESS NOTE    Rodney [name]  ZOX:[id number] DOB: 1945-11-04 DOA: 04/17/2023 PCP: Emilio Aspen, MD   Brief Narrative:  HPI per Dr. Mikey College on 04/20/23 Rodney [name] is a 77 y.o. male with medical history significant of HTN, HTN, chronic HFpEF, BPH, chronic left subdural hematoma presented presented with frequent falls on 7/21.  Workup in the ED found patient had enlarged left subdural hematoma and patient underwent emergency SDH evacuation on same day.  Patient tolerated procedure well.  After the procedure, patient however has had poor appetite and oral intake for Sunday and Monday, and his appetite picked up somewhat yesterday.  Denies any abdominal pain no nauseous vomiting or diarrhea.  This morning, the patient was feeling dizzy when standing up, and vital signs showed patient had hypotension the whole morning.  Afternoon patient started having visual hallucination with "Clock and TV set upside down and on the floor" but he denies any vertigo and symptoms lasted 3 to 5 minutes.  Patient also been taking around-the-clock Vicodin for surgical pain and 3 doses before this afternoon, when his blood pressure was systolic in the 80s and was given IV bolus of 1000 mL normal saline and visual hallucinations resolved.   **Interim History States he was hallucinating and states he had been having some hallucinations prior to admission but never "this bad." Admitted to being a daily drinker (heavy drinking and approximately 8 shots a day) and stated that his last drink was Sunday. He feels his hallucinations are 2/2 to his daily drinking. He hallucinated yesterday afternoon very briefly. Awaiting Orthostatic VS but states dizziness is improving. Now complaining of Urinary Burning and agree with obtaining a U/A but will trial pyridium. If CIWA Scores consistently <5, Hallucinations improve, and he is not dizzy he can be discharged from a Medicine standpoint.   Assessment and Plan:  Visual  Hallucinations in the Setting of EtOH Withdrawal -Suspect multifactorial, clinically suspect commendation of hypotension as well as with ongoing narcotic usage with a possible impaired kidney function but most likely Alcohol Withdrawal related given his heavy alcohol abuse -Symptoms resolved after blood pressure improved but will monitor overnight to ensure he does not hallucinate further and had a brief episode on 04/21/23 -Recommend hold off home BP meds including amlodipine, ARB and spironolactone -Agreed with as needed labetalol -Placed on CIWA Protocol given his Hx of Alcohol Abuse -Will hold off brain imaging for now as patient's visual hallucination resolved spontaneously yesterday after IV hydration, consider brain imaging if visual hallucinations comes back -Place on Delirium Precautions -If No hallucinations overnight and if BP is stable and patient is not withdrawing suspect he can be discharged Home with Home Health in the AM. Ensure CIWA Scores are less than 5 consistently prior to D/C and will need further management and counseling about alcoholic consumption    Hypotension -Input and output record showed the patient has a gross 2500 ml of negative in the last 2 days, implying volume contraction/dehydration, will Continued IV fluid x 12 hours and recheck kidney function and vital signs/orthostatic signs -Check Orthostatics and he went from 125/106 Lying -> 118/78 Sitting -> 99/74 Standing -> 118/77 Standing at 3 Minutes but was not Dizzy or sumptomatic    AKI, improved  -On admission creatinine 1.4 compared to baseline less than 1.0 about 1 year ago, with a gross negative I&O --BUN/Cr Trend: Recent Labs  Lab 04/17/23 1203 04/20/23 1946 04/21/23 0001 04/22/23 0409 04/23/23 0508  BUN 26* 15 13 13  14  CREATININE 1.42* 1.00 0.98 0.93 0.99  -IVF now stopped -Avoid Nephrotoxic Medications, Contrast Dyes, Hypotension and Dehydration to Ensure Adequate Renal Perfusion and will need to  Renally Adjust Meds -Continue to Monitor and Trend Renal Function carefully and repeat CMP in the AM   Hyponatremia -Na+ Trend: Recent Labs  Lab 04/17/23 1203 04/20/23 1946 04/21/23 0001 04/22/23 0409 04/23/23 0508  NA 135 133* 133* 133* 134*  -IVF now stopped. ? If Hyponatremia is related to Alcoholism and Potomania  -Continue to Monitor and Trend and Repeat CMP within 1 week   History of Alcohol Abuse with Withdrawal Concern -Last drink was 4 Days ago and could likely to contribute to his current mentation changes given that he is within the withdrawal period. Placed on CIWA Protocol and will monitor for any more Hallucinations  Urinary Burning -Agree with Obtaining a U/A -U/A Unremarkable and likely does not need Abx given No Leukocytes, Nitrites; Reflex not sent  -Trial of Pyridium -Follow up with PCP in the outpatient setting    SDU status post hematoma evacuation -Stable, as per primary team  Macrocytic Anemia -Hgb/Hct Trend: Recent Labs  Lab 04/17/23 1203 04/20/23 1946 04/21/23 0001 04/22/23 0409 04/23/23 0508  HGB 15.0 12.6* 13.0 12.3* 13.0  HCT 43.9 37.6* 39.3 36.8* 38.0*  MCV 102.1* 107.7* 103.7* 101.7* 101.9*  -Checked Anemia Panel and showed an iron level of 30, UIBC of 246, TIBC of 276, saturation ratios of 11%, ferritin level 135, folate level 25.4 and vitamin B12 of 774 -Continue to Monitor for S/Sx of Bleeding; no overt bleeding noted -Repeat CBC in the AM  Hypoalbuminemia -Patient's Albumin Trend: Recent Labs  Lab 04/21/23 0830 04/22/23 0409 04/23/23 0508  ALBUMIN 2.6* 2.7* 2.9*  -Continue to Monitor and Trend and repeat CMP in the AM  Obesity -Complicates overall prognosis and care -Estimated body mass index is 32.08 kg/m as calculated from the following:   Height as of this encounter: 5\' 11"  (1.803 m).   Weight as of this encounter: 104.3 kg.  -Weight Loss and Dietary Counseling given   DVT prophylaxis: SCDs Start: 04/18/23 0004     Code Status: Full Code Family Communication: No family present at bedside  Disposition Plan:  Level of care: Progressive Status is: Inpatient Remains inpatient appropriate because: Primary Team discharging the patient today  Consultants:  [facility] Neurosurgery  Procedures:  Procedure: Bur hole craniectomy for evacuation of subacute subdural hematoma. Left.   Antimicrobials:  Anti-infectives (From admission, onward)    Start     Dose/Rate Route Frequency Ordered Stop   04/22/23 1130  sulfamethoxazole-trimethoprim (BACTRIM DS) 800-160 MG per tablet 1 tablet        1 tablet Oral Every 12 hours 04/22/23 1036     04/18/23 0003  ceFAZolin (ANCEF) IVPB 1 g/50 mL premix        1 g 100 mL/hr over 30 Minutes Intravenous Every 8 hours 04/18/23 0003 04/18/23 0907   04/17/23 2136  ceFAZolin (ANCEF) 2-4 GM/100ML-% IVPB       Note to Pharmacy: Edmonia Caprio: cabinet override      04/17/23 2136 04/18/23 0105       Subjective: Seen and examined at bedside thinks he is doing better.  Not dizzy.  Continues to have mild hallucination but thinks that they are not as long lasting and are getting improved.  Continues to have some slight tremors.  No nausea or vomiting.  Feels okay.  Primary team is planning on discharging the  patient later today.  Objective: Vitals:   04/23/23 0324 04/23/23 0811 04/23/23 0905 04/23/23 1200  BP: 134/79  132/85 (!) 125/106  Pulse:  70 61   Resp:  (!) 26 19 (!) 22  Temp: 98.2 F (36.8 C) 98 F (36.7 C)    TempSrc: Oral Axillary    SpO2:  94% 94% 94%  Weight:      Height:        Intake/Output Summary (Last 24 hours) at 04/23/2023 1459 Last data filed at 04/23/2023 1610 Gross per 24 hour  Intake --  Output 2050 ml  Net -2050 ml   Filed Weights   04/17/23 1155  Weight: 104.3 kg   Examination: Physical Exam:  Constitutional: WN/WD elderly Caucasian male in no acute distress appears calm Respiratory: Diminished to auscultation bilaterally, no wheezing,  rales, rhonchi or crackles. Normal respiratory effort and patient is not tachypenic. No accessory muscle use.  Unlabored breathing Cardiovascular: RRR, no murmurs / rubs / gallops. S1 and S2 auscultated. No extremity edema.  Abdomen: Soft, non-tender, distended secondary to body habitus. Bowel sounds positive.  GU: Deferred. Musculoskeletal: No clubbing / cyanosis of digits/nails. No joint deformity upper and lower extremities.  Skin: No rashes, lesions, ulcers on limited skin evaluation but his incisions on the left side of his head have staples and are clean dry intact. No induration; Warm and dry.  Neurologic: CN 2-12 grossly intact with no focal deficits. Romberg sign and cerebellar reflexes not assessed.  Psychiatric: Normal judgment and insight. Alert and oriented x 3. Normal mood and appropriate affect.   Data Reviewed: I have personally reviewed following labs and imaging studies  CBC: Recent Labs  Lab 04/17/23 1203 04/20/23 1946 04/21/23 0001 04/22/23 0409 04/23/23 0508  WBC 10.7* 9.3 9.0 8.9 8.7  NEUTROABS  --   --   --  6.9 7.1  HGB 15.0 12.6* 13.0 12.3* 13.0  HCT 43.9 37.6* 39.3 36.8* 38.0*  MCV 102.1* 107.7* 103.7* 101.7* 101.9*  PLT 275 253 262 308 315   Basic Metabolic Panel: Recent Labs  Lab 04/17/23 1203 04/20/23 1946 04/21/23 0001 04/21/23 0830 04/22/23 0409 04/23/23 0508  NA 135 133* 133*  --  133* 134*  K 4.6 4.1 4.1  --  4.1 4.1  CL 96* 101 100  --  98 99  CO2 27 22 25   --  25 24  GLUCOSE 107* 124* 116*  --  99 112*  BUN 26* 15 13  --  13 14  CREATININE 1.42* 1.00 0.98  --  0.93 0.99  CALCIUM 10.2 8.2* 8.5*  --  8.7* 8.9  MG  --   --   --  1.9 1.8 2.1  PHOS  --   --   --  2.9 3.2 3.9   GFR: Estimated Creatinine Clearance: 76.8 mL/min (by C-G formula based on SCr of 0.99 mg/dL). Liver Function Tests: Recent Labs  Lab 04/21/23 0830 04/22/23 0409 04/23/23 0508  AST 21 25 34  ALT 15 22 35  ALKPHOS 31* 30* 38  BILITOT 0.5 0.6 0.6  PROT 5.8*  6.2* 6.6  ALBUMIN 2.6* 2.7* 2.9*   No results for input(s): "LIPASE", "AMYLASE" in the last 168 hours. No results for input(s): "AMMONIA" in the last 168 hours. Coagulation Profile: No results for input(s): "INR", "PROTIME" in the last 168 hours. Cardiac Enzymes: No results for input(s): "CKTOTAL", "CKMB", "CKMBINDEX", "TROPONINI" in the last 168 hours. BNP (last 3 results) No results for input(s): "PROBNP" in the last  8760 hours. HbA1C: No results for input(s): "HGBA1C" in the last 72 hours. CBG: No results for input(s): "GLUCAP" in the last 168 hours. Lipid Profile: No results for input(s): "CHOL", "HDL", "LDLCALC", "TRIG", "CHOLHDL", "LDLDIRECT" in the last 72 hours. Thyroid Function Tests: No results for input(s): "TSH", "T4TOTAL", "FREET4", "T3FREE", "THYROIDAB" in the last 72 hours. Anemia Panel: Recent Labs    04/23/23 0508  VITAMINB12 774  FOLATE 25.4  FERRITIN 175  TIBC 276  IRON 30*  RETICCTPCT 1.9   Sepsis Labs: Recent Labs  Lab 04/20/23 1946 04/21/23 0001  LATICACIDVEN 1.6 1.1    Recent Results (from the past 240 hour(s))  Surgical pcr screen     Status: None   Collection Time: 04/17/23  6:53 PM   Specimen: Nasal Mucosa; Nasal Swab  Result Value Ref Range Status   MRSA, PCR NEGATIVE NEGATIVE Final   Staphylococcus aureus NEGATIVE NEGATIVE Final    Comment: (NOTE) The Xpert SA Assay (FDA approved for NASAL specimens in patients 91 years of age and older), is one component of a comprehensive surveillance program. It is not intended to diagnose infection nor to guide or monitor treatment. Performed at [facility], [address], [place], [place] [address]     Radiology Studies: No results found.  Scheduled Meds:  acetaminophen  1,000 mg Oral Once   aspirin EC  162 mg Oral QHS   B-complex with vitamin C  1 tablet Oral Daily   Chlorhexidine Gluconate Cloth  6 each Topical Daily   cholecalciferol  1,000 Units Oral Daily   cyanocobalamin   1,000 mcg Oral Daily   DULoxetine  60 mg Oral QHS   ezetimibe  10 mg Oral Daily   finasteride  5 mg Oral Daily   fluticasone furoate-vilanterol  1 puff Inhalation Daily   And   umeclidinium bromide  1 puff Inhalation Daily   folic acid  1 mg Oral Daily   levETIRAcetam  500 mg Oral BID   magnesium oxide  400 mg Oral QODAY   metoprolol succinate  50 mg Oral Daily   multivitamin with minerals  1 tablet Oral Daily   pantoprazole  40 mg Oral Daily   phenazopyridine  100 mg Oral TID WC   polyethylene glycol  17 g Oral Daily   pravastatin  80 mg Oral QPM   pyridOXINE  100 mg Oral Daily   senna  1 tablet Oral BID   sulfamethoxazole-trimethoprim  1 tablet Oral Q12H   tamsulosin  0.4 mg Oral QHS   thiamine  100 mg Oral Daily   Or   thiamine  100 mg Intravenous Daily   Continuous Infusions:   LOS: 6 days   Marguerita Merles, DO [facility] Available via Epic secure chat 7am-7pm After these hours, please refer to coverage provider listed on [email or web address] 04/23/2023, 2:59 PM

## 2023-04-23 NOTE — Progress Notes (Signed)
  Pt a/o, RA, VS stable. Orthostatics adequate for DC. All dressings changed. Wife is at the bedside. AVS printed, education provided and all questions were answered. Wife, pt with belongings transferred off the floor

## 2023-04-26 DIAGNOSIS — G4730 Sleep apnea, unspecified: Secondary | ICD-10-CM | POA: Diagnosis not present

## 2023-04-26 DIAGNOSIS — M5412 Radiculopathy, cervical region: Secondary | ICD-10-CM | POA: Diagnosis not present

## 2023-04-26 DIAGNOSIS — H33311 Horseshoe tear of retina without detachment, right eye: Secondary | ICD-10-CM | POA: Diagnosis not present

## 2023-04-26 DIAGNOSIS — G5600 Carpal tunnel syndrome, unspecified upper limb: Secondary | ICD-10-CM | POA: Diagnosis not present

## 2023-04-26 DIAGNOSIS — E7800 Pure hypercholesterolemia, unspecified: Secondary | ICD-10-CM | POA: Diagnosis not present

## 2023-04-26 DIAGNOSIS — F1090 Alcohol use, unspecified, uncomplicated: Secondary | ICD-10-CM | POA: Diagnosis not present

## 2023-04-26 DIAGNOSIS — R7303 Prediabetes: Secondary | ICD-10-CM | POA: Diagnosis not present

## 2023-04-26 DIAGNOSIS — K219 Gastro-esophageal reflux disease without esophagitis: Secondary | ICD-10-CM | POA: Diagnosis not present

## 2023-04-26 DIAGNOSIS — I700 Atherosclerosis of aorta: Secondary | ICD-10-CM | POA: Diagnosis not present

## 2023-04-26 DIAGNOSIS — Z87440 Personal history of urinary (tract) infections: Secondary | ICD-10-CM | POA: Diagnosis not present

## 2023-04-26 DIAGNOSIS — Z48811 Encounter for surgical aftercare following surgery on the nervous system: Secondary | ICD-10-CM | POA: Diagnosis not present

## 2023-04-26 DIAGNOSIS — M1990 Unspecified osteoarthritis, unspecified site: Secondary | ICD-10-CM | POA: Diagnosis not present

## 2023-04-26 DIAGNOSIS — Z7951 Long term (current) use of inhaled steroids: Secondary | ICD-10-CM | POA: Diagnosis not present

## 2023-04-26 DIAGNOSIS — Z556 Problems related to health literacy: Secondary | ICD-10-CM | POA: Diagnosis not present

## 2023-04-26 DIAGNOSIS — I10 Essential (primary) hypertension: Secondary | ICD-10-CM | POA: Diagnosis not present

## 2023-04-26 DIAGNOSIS — F17210 Nicotine dependence, cigarettes, uncomplicated: Secondary | ICD-10-CM | POA: Diagnosis not present

## 2023-04-26 DIAGNOSIS — F32A Depression, unspecified: Secondary | ICD-10-CM | POA: Diagnosis not present

## 2023-04-26 DIAGNOSIS — Z86010 Personal history of colonic polyps: Secondary | ICD-10-CM | POA: Diagnosis not present

## 2023-04-26 DIAGNOSIS — Z7982 Long term (current) use of aspirin: Secondary | ICD-10-CM | POA: Diagnosis not present

## 2023-04-26 DIAGNOSIS — Z96641 Presence of right artificial hip joint: Secondary | ICD-10-CM | POA: Diagnosis not present

## 2023-04-29 ENCOUNTER — Ambulatory Visit
Admission: RE | Admit: 2023-04-29 | Discharge: 2023-04-29 | Disposition: A | Discharge status: Home / Self Care | Payer: Medicare Other | Source: Ambulatory Visit | Attending: Neurosurgery | Admitting: Neurosurgery

## 2023-04-29 DIAGNOSIS — S065XAA Traumatic subdural hemorrhage with loss of consciousness status unknown, initial encounter: Secondary | ICD-10-CM

## 2023-04-29 DIAGNOSIS — I6200 Nontraumatic subdural hemorrhage, unspecified: Secondary | ICD-10-CM | POA: Diagnosis not present

## 2023-04-29 DIAGNOSIS — G939 Disorder of brain, unspecified: Secondary | ICD-10-CM | POA: Diagnosis not present

## 2023-05-02 DIAGNOSIS — Z96641 Presence of right artificial hip joint: Secondary | ICD-10-CM | POA: Diagnosis not present

## 2023-05-02 DIAGNOSIS — F1090 Alcohol use, unspecified, uncomplicated: Secondary | ICD-10-CM | POA: Diagnosis not present

## 2023-05-02 DIAGNOSIS — Z87440 Personal history of urinary (tract) infections: Secondary | ICD-10-CM | POA: Diagnosis not present

## 2023-05-02 DIAGNOSIS — E7800 Pure hypercholesterolemia, unspecified: Secondary | ICD-10-CM | POA: Diagnosis not present

## 2023-05-02 DIAGNOSIS — F17210 Nicotine dependence, cigarettes, uncomplicated: Secondary | ICD-10-CM | POA: Diagnosis not present

## 2023-05-02 DIAGNOSIS — G4730 Sleep apnea, unspecified: Secondary | ICD-10-CM | POA: Diagnosis not present

## 2023-05-02 DIAGNOSIS — Z86010 Personal history of colonic polyps: Secondary | ICD-10-CM | POA: Diagnosis not present

## 2023-05-02 DIAGNOSIS — Z7982 Long term (current) use of aspirin: Secondary | ICD-10-CM | POA: Diagnosis not present

## 2023-05-02 DIAGNOSIS — H33311 Horseshoe tear of retina without detachment, right eye: Secondary | ICD-10-CM | POA: Diagnosis not present

## 2023-05-02 DIAGNOSIS — M1990 Unspecified osteoarthritis, unspecified site: Secondary | ICD-10-CM | POA: Diagnosis not present

## 2023-05-02 DIAGNOSIS — F32A Depression, unspecified: Secondary | ICD-10-CM | POA: Diagnosis not present

## 2023-05-02 DIAGNOSIS — M5412 Radiculopathy, cervical region: Secondary | ICD-10-CM | POA: Diagnosis not present

## 2023-05-02 DIAGNOSIS — K219 Gastro-esophageal reflux disease without esophagitis: Secondary | ICD-10-CM | POA: Diagnosis not present

## 2023-05-02 DIAGNOSIS — G5600 Carpal tunnel syndrome, unspecified upper limb: Secondary | ICD-10-CM | POA: Diagnosis not present

## 2023-05-02 DIAGNOSIS — Z7951 Long term (current) use of inhaled steroids: Secondary | ICD-10-CM | POA: Diagnosis not present

## 2023-05-02 DIAGNOSIS — I700 Atherosclerosis of aorta: Secondary | ICD-10-CM | POA: Diagnosis not present

## 2023-05-02 DIAGNOSIS — Z48811 Encounter for surgical aftercare following surgery on the nervous system: Secondary | ICD-10-CM | POA: Diagnosis not present

## 2023-05-02 DIAGNOSIS — I10 Essential (primary) hypertension: Secondary | ICD-10-CM | POA: Diagnosis not present

## 2023-05-02 DIAGNOSIS — R7303 Prediabetes: Secondary | ICD-10-CM | POA: Diagnosis not present

## 2023-05-02 DIAGNOSIS — Z556 Problems related to health literacy: Secondary | ICD-10-CM | POA: Diagnosis not present

## 2023-05-03 DIAGNOSIS — Z556 Problems related to health literacy: Secondary | ICD-10-CM | POA: Diagnosis not present

## 2023-05-03 DIAGNOSIS — K219 Gastro-esophageal reflux disease without esophagitis: Secondary | ICD-10-CM | POA: Diagnosis not present

## 2023-05-03 DIAGNOSIS — F1090 Alcohol use, unspecified, uncomplicated: Secondary | ICD-10-CM | POA: Diagnosis not present

## 2023-05-03 DIAGNOSIS — M1990 Unspecified osteoarthritis, unspecified site: Secondary | ICD-10-CM | POA: Diagnosis not present

## 2023-05-03 DIAGNOSIS — R7303 Prediabetes: Secondary | ICD-10-CM | POA: Diagnosis not present

## 2023-05-03 DIAGNOSIS — H33311 Horseshoe tear of retina without detachment, right eye: Secondary | ICD-10-CM | POA: Diagnosis not present

## 2023-05-03 DIAGNOSIS — F17210 Nicotine dependence, cigarettes, uncomplicated: Secondary | ICD-10-CM | POA: Diagnosis not present

## 2023-05-03 DIAGNOSIS — F32A Depression, unspecified: Secondary | ICD-10-CM | POA: Diagnosis not present

## 2023-05-03 DIAGNOSIS — M5412 Radiculopathy, cervical region: Secondary | ICD-10-CM | POA: Diagnosis not present

## 2023-05-03 DIAGNOSIS — Z87440 Personal history of urinary (tract) infections: Secondary | ICD-10-CM | POA: Diagnosis not present

## 2023-05-03 DIAGNOSIS — Z86010 Personal history of colonic polyps: Secondary | ICD-10-CM | POA: Diagnosis not present

## 2023-05-03 DIAGNOSIS — G5600 Carpal tunnel syndrome, unspecified upper limb: Secondary | ICD-10-CM | POA: Diagnosis not present

## 2023-05-03 DIAGNOSIS — I700 Atherosclerosis of aorta: Secondary | ICD-10-CM | POA: Diagnosis not present

## 2023-05-03 DIAGNOSIS — Z7951 Long term (current) use of inhaled steroids: Secondary | ICD-10-CM | POA: Diagnosis not present

## 2023-05-03 DIAGNOSIS — Z96641 Presence of right artificial hip joint: Secondary | ICD-10-CM | POA: Diagnosis not present

## 2023-05-03 DIAGNOSIS — G4730 Sleep apnea, unspecified: Secondary | ICD-10-CM | POA: Diagnosis not present

## 2023-05-03 DIAGNOSIS — I10 Essential (primary) hypertension: Secondary | ICD-10-CM | POA: Diagnosis not present

## 2023-05-03 DIAGNOSIS — Z7982 Long term (current) use of aspirin: Secondary | ICD-10-CM | POA: Diagnosis not present

## 2023-05-03 DIAGNOSIS — E7800 Pure hypercholesterolemia, unspecified: Secondary | ICD-10-CM | POA: Diagnosis not present

## 2023-05-03 DIAGNOSIS — Z48811 Encounter for surgical aftercare following surgery on the nervous system: Secondary | ICD-10-CM | POA: Diagnosis not present

## 2023-05-04 ENCOUNTER — Telehealth: Payer: Self-pay | Admitting: Internal Medicine

## 2023-05-04 NOTE — Telephone Encounter (Signed)
Called the pt back. He has questions about his Nexletol. He is supposed to see [name] this month. He can't afford this medication as it is $1500.00. He is in a doughnut whole with his insurance. He wants to know if he needs to stay on it. He is having trouble with dizziness and seizures. Please advise. He has 6 tablets until he sees you on the 27th of this month.

## 2023-05-04 NOTE — Telephone Encounter (Signed)
Pt c/o medication issue:  1. Name of Medication:   Bempedoic Acid (NEXLETOL) 180 MG TABS    2. How are you currently taking this medication (dosage and times per day)?   3. Are you having a reaction (difficulty breathing--STAT)?   4. What is your medication issue? Patient would like a call back to discuss this medication further in regards to cost and continuation. Please advise.

## 2023-05-05 NOTE — Telephone Encounter (Signed)
Returned pt's call. Advised him that per our pharmacist there is no program assistance for this med. However, we do have some cost savings card. This nurse sent a savings card to the pt via mail.

## 2023-05-05 NOTE — Telephone Encounter (Signed)
Patient was returning call. Please advise ?

## 2023-05-05 NOTE — Telephone Encounter (Signed)
Unfortunately there is no patient assistance for that medication

## 2023-05-05 NOTE — Telephone Encounter (Signed)
Call and LM to call office

## 2023-05-05 NOTE — Telephone Encounter (Signed)
Call to patient. LM to call office

## 2023-05-10 ENCOUNTER — Other Ambulatory Visit: Payer: Self-pay | Admitting: Student

## 2023-05-10 DIAGNOSIS — S065XAA Traumatic subdural hemorrhage with loss of consciousness status unknown, initial encounter: Secondary | ICD-10-CM

## 2023-05-13 DIAGNOSIS — K219 Gastro-esophageal reflux disease without esophagitis: Secondary | ICD-10-CM | POA: Diagnosis not present

## 2023-05-13 DIAGNOSIS — M1990 Unspecified osteoarthritis, unspecified site: Secondary | ICD-10-CM | POA: Diagnosis not present

## 2023-05-13 DIAGNOSIS — I700 Atherosclerosis of aorta: Secondary | ICD-10-CM | POA: Diagnosis not present

## 2023-05-13 DIAGNOSIS — F17210 Nicotine dependence, cigarettes, uncomplicated: Secondary | ICD-10-CM | POA: Diagnosis not present

## 2023-05-13 DIAGNOSIS — R7303 Prediabetes: Secondary | ICD-10-CM | POA: Diagnosis not present

## 2023-05-13 DIAGNOSIS — Z7951 Long term (current) use of inhaled steroids: Secondary | ICD-10-CM | POA: Diagnosis not present

## 2023-05-13 DIAGNOSIS — Z96641 Presence of right artificial hip joint: Secondary | ICD-10-CM | POA: Diagnosis not present

## 2023-05-13 DIAGNOSIS — Z86010 Personal history of colonic polyps: Secondary | ICD-10-CM | POA: Diagnosis not present

## 2023-05-13 DIAGNOSIS — Z87440 Personal history of urinary (tract) infections: Secondary | ICD-10-CM | POA: Diagnosis not present

## 2023-05-13 DIAGNOSIS — Z7982 Long term (current) use of aspirin: Secondary | ICD-10-CM | POA: Diagnosis not present

## 2023-05-13 DIAGNOSIS — E7800 Pure hypercholesterolemia, unspecified: Secondary | ICD-10-CM | POA: Diagnosis not present

## 2023-05-13 DIAGNOSIS — Z556 Problems related to health literacy: Secondary | ICD-10-CM | POA: Diagnosis not present

## 2023-05-13 DIAGNOSIS — M5412 Radiculopathy, cervical region: Secondary | ICD-10-CM | POA: Diagnosis not present

## 2023-05-13 DIAGNOSIS — Z48811 Encounter for surgical aftercare following surgery on the nervous system: Secondary | ICD-10-CM | POA: Diagnosis not present

## 2023-05-13 DIAGNOSIS — G4730 Sleep apnea, unspecified: Secondary | ICD-10-CM | POA: Diagnosis not present

## 2023-05-13 DIAGNOSIS — I10 Essential (primary) hypertension: Secondary | ICD-10-CM | POA: Diagnosis not present

## 2023-05-13 DIAGNOSIS — H33311 Horseshoe tear of retina without detachment, right eye: Secondary | ICD-10-CM | POA: Diagnosis not present

## 2023-05-13 DIAGNOSIS — G5600 Carpal tunnel syndrome, unspecified upper limb: Secondary | ICD-10-CM | POA: Diagnosis not present

## 2023-05-20 DIAGNOSIS — I10 Essential (primary) hypertension: Secondary | ICD-10-CM | POA: Diagnosis not present

## 2023-05-20 DIAGNOSIS — M5412 Radiculopathy, cervical region: Secondary | ICD-10-CM | POA: Diagnosis not present

## 2023-05-20 DIAGNOSIS — G4730 Sleep apnea, unspecified: Secondary | ICD-10-CM | POA: Diagnosis not present

## 2023-05-20 DIAGNOSIS — Z87440 Personal history of urinary (tract) infections: Secondary | ICD-10-CM | POA: Diagnosis not present

## 2023-05-20 DIAGNOSIS — Z556 Problems related to health literacy: Secondary | ICD-10-CM | POA: Diagnosis not present

## 2023-05-20 DIAGNOSIS — K219 Gastro-esophageal reflux disease without esophagitis: Secondary | ICD-10-CM | POA: Diagnosis not present

## 2023-05-20 DIAGNOSIS — Z7982 Long term (current) use of aspirin: Secondary | ICD-10-CM | POA: Diagnosis not present

## 2023-05-20 DIAGNOSIS — F17210 Nicotine dependence, cigarettes, uncomplicated: Secondary | ICD-10-CM | POA: Diagnosis not present

## 2023-05-20 DIAGNOSIS — Z48811 Encounter for surgical aftercare following surgery on the nervous system: Secondary | ICD-10-CM | POA: Diagnosis not present

## 2023-05-20 DIAGNOSIS — G5600 Carpal tunnel syndrome, unspecified upper limb: Secondary | ICD-10-CM | POA: Diagnosis not present

## 2023-05-20 DIAGNOSIS — I700 Atherosclerosis of aorta: Secondary | ICD-10-CM | POA: Diagnosis not present

## 2023-05-20 DIAGNOSIS — Z96641 Presence of right artificial hip joint: Secondary | ICD-10-CM | POA: Diagnosis not present

## 2023-05-20 DIAGNOSIS — H33311 Horseshoe tear of retina without detachment, right eye: Secondary | ICD-10-CM | POA: Diagnosis not present

## 2023-05-20 DIAGNOSIS — Z7951 Long term (current) use of inhaled steroids: Secondary | ICD-10-CM | POA: Diagnosis not present

## 2023-05-20 DIAGNOSIS — R7303 Prediabetes: Secondary | ICD-10-CM | POA: Diagnosis not present

## 2023-05-20 DIAGNOSIS — E7800 Pure hypercholesterolemia, unspecified: Secondary | ICD-10-CM | POA: Diagnosis not present

## 2023-05-20 DIAGNOSIS — M1990 Unspecified osteoarthritis, unspecified site: Secondary | ICD-10-CM | POA: Diagnosis not present

## 2023-05-20 DIAGNOSIS — Z86010 Personal history of colonic polyps: Secondary | ICD-10-CM | POA: Diagnosis not present

## 2023-05-24 ENCOUNTER — Encounter (HOSPITAL_BASED_OUTPATIENT_CLINIC_OR_DEPARTMENT_OTHER): Payer: Self-pay | Admitting: Internal Medicine

## 2023-05-24 ENCOUNTER — Ambulatory Visit (HOSPITAL_BASED_OUTPATIENT_CLINIC_OR_DEPARTMENT_OTHER): Payer: Medicare Other | Admitting: Internal Medicine

## 2023-05-24 VITALS — BP 142/77 | HR 80 | Ht 72.0 in | Wt 217.6 lb

## 2023-05-24 DIAGNOSIS — Z87440 Personal history of urinary (tract) infections: Secondary | ICD-10-CM | POA: Diagnosis not present

## 2023-05-24 DIAGNOSIS — I2510 Atherosclerotic heart disease of native coronary artery without angina pectoris: Secondary | ICD-10-CM | POA: Diagnosis not present

## 2023-05-24 DIAGNOSIS — H33311 Horseshoe tear of retina without detachment, right eye: Secondary | ICD-10-CM | POA: Diagnosis not present

## 2023-05-24 DIAGNOSIS — K219 Gastro-esophageal reflux disease without esophagitis: Secondary | ICD-10-CM | POA: Diagnosis not present

## 2023-05-24 DIAGNOSIS — E785 Hyperlipidemia, unspecified: Secondary | ICD-10-CM | POA: Diagnosis not present

## 2023-05-24 DIAGNOSIS — Z556 Problems related to health literacy: Secondary | ICD-10-CM | POA: Diagnosis not present

## 2023-05-24 DIAGNOSIS — G4730 Sleep apnea, unspecified: Secondary | ICD-10-CM | POA: Diagnosis not present

## 2023-05-24 DIAGNOSIS — I10 Essential (primary) hypertension: Secondary | ICD-10-CM | POA: Diagnosis not present

## 2023-05-24 DIAGNOSIS — M5412 Radiculopathy, cervical region: Secondary | ICD-10-CM | POA: Diagnosis not present

## 2023-05-24 DIAGNOSIS — Z48811 Encounter for surgical aftercare following surgery on the nervous system: Secondary | ICD-10-CM | POA: Diagnosis not present

## 2023-05-24 DIAGNOSIS — I2584 Coronary atherosclerosis due to calcified coronary lesion: Secondary | ICD-10-CM

## 2023-05-24 DIAGNOSIS — Z7951 Long term (current) use of inhaled steroids: Secondary | ICD-10-CM | POA: Diagnosis not present

## 2023-05-24 DIAGNOSIS — M1990 Unspecified osteoarthritis, unspecified site: Secondary | ICD-10-CM | POA: Diagnosis not present

## 2023-05-24 DIAGNOSIS — G5600 Carpal tunnel syndrome, unspecified upper limb: Secondary | ICD-10-CM | POA: Diagnosis not present

## 2023-05-24 DIAGNOSIS — Z96641 Presence of right artificial hip joint: Secondary | ICD-10-CM | POA: Diagnosis not present

## 2023-05-24 DIAGNOSIS — I700 Atherosclerosis of aorta: Secondary | ICD-10-CM | POA: Diagnosis not present

## 2023-05-24 DIAGNOSIS — Z86010 Personal history of colonic polyps: Secondary | ICD-10-CM | POA: Diagnosis not present

## 2023-05-24 DIAGNOSIS — Z7982 Long term (current) use of aspirin: Secondary | ICD-10-CM | POA: Diagnosis not present

## 2023-05-24 DIAGNOSIS — R7303 Prediabetes: Secondary | ICD-10-CM | POA: Diagnosis not present

## 2023-05-24 DIAGNOSIS — F17210 Nicotine dependence, cigarettes, uncomplicated: Secondary | ICD-10-CM | POA: Diagnosis not present

## 2023-05-24 DIAGNOSIS — E7800 Pure hypercholesterolemia, unspecified: Secondary | ICD-10-CM | POA: Diagnosis not present

## 2023-05-24 MED ORDER — NEXLIZET 180-10 MG PO TABS: 1.0000 | ORAL_TABLET | Freq: Every day | ORAL | 11 refills | Status: DC

## 2023-05-24 NOTE — Patient Instructions (Signed)
Medication Instructions:  STOP- Zetia STOP- Nexltol START- Nexlizet 180/10 mg by mouth daily  *If you need a refill on your cardiac medications before your next appointment, please call your pharmacy*   Lab Work: Fasting Lipid Today Fasting Lipid in 6 Months  If you have labs (blood work) drawn today and your tests are completely normal, you will receive your results only by: MyChart Message (if you have MyChart) OR A paper copy in the mail If you have any lab test that is abnormal or we need to change your treatment, we will call you to review the results.   Testing/Procedures: None Ordered   Follow-Up: At [facility], you and your health needs are our priority.  As part of our continuing mission to provide you with exceptional heart care, we have created designated Provider Care Teams.  These Care Teams include your primary Cardiologist (physician) and Advanced Practice Providers (APPs -  Physician Assistants and Nurse Practitioners) who all work together to provide you with the care you need, when you need it.  We recommend signing up for the patient portal called "MyChart".  Sign up information is provided on this After Visit Summary.  MyChart is used to connect with patients for Virtual Visits (Telemedicine).  Patients are able to view lab/test results, encounter notes, upcoming appointments, etc.  Non-urgent messages can be sent to your provider as well.   To learn more about what you can do with MyChart, go to [email or web address].    Your next appointment:   6 month(s)  Provider:   K. Italy Hilty, MD    Other Instructions
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
## 2023-05-25 LAB — LIPID PANEL
Chol/HDL Ratio: 2.3 ratio (ref 0.0–5.0)
Cholesterol, Total: 138 mg/dL (ref 100–199)
HDL: 61 mg/dL (ref >39)
LDL Chol Calc (NIH): 51 mg/dL (ref 0–99)
Triglycerides: 155 mg/dL — ABNORMAL HIGH (ref 0–149)
VLDL Cholesterol Cal: 26 mg/dL (ref 5–40)

## 2023-05-31 DIAGNOSIS — E782 Mixed hyperlipidemia: Secondary | ICD-10-CM | POA: Diagnosis not present

## 2023-05-31 DIAGNOSIS — I77810 Thoracic aortic ectasia: Secondary | ICD-10-CM | POA: Diagnosis not present

## 2023-05-31 DIAGNOSIS — M7021 Olecranon bursitis, right elbow: Secondary | ICD-10-CM | POA: Diagnosis not present

## 2023-05-31 DIAGNOSIS — J4489 Other specified chronic obstructive pulmonary disease: Secondary | ICD-10-CM | POA: Diagnosis not present

## 2023-05-31 DIAGNOSIS — J849 Interstitial pulmonary disease, unspecified: Secondary | ICD-10-CM | POA: Diagnosis not present

## 2023-05-31 DIAGNOSIS — G459 Transient cerebral ischemic attack, unspecified: Secondary | ICD-10-CM | POA: Diagnosis not present

## 2023-06-03 ENCOUNTER — Telehealth (HOSPITAL_BASED_OUTPATIENT_CLINIC_OR_DEPARTMENT_OTHER): Payer: Self-pay | Admitting: Internal Medicine

## 2023-06-03 ENCOUNTER — Other Ambulatory Visit: Payer: Self-pay | Admitting: Internal Medicine

## 2023-06-03 ENCOUNTER — Other Ambulatory Visit (HOSPITAL_BASED_OUTPATIENT_CLINIC_OR_DEPARTMENT_OTHER): Payer: Self-pay

## 2023-06-03 NOTE — Telephone Encounter (Signed)
Patient came in and stated that Hilty was suppose to send in Nexlizet and is not at the pharmacy. Patient stated that the nurse was suppose to find out the cost but never got back with him. If someone is able to call him. Also he is suppose to see Dr. Seth Bake but he is leaving 06/28/2023. Patient want to know if he is able to keep Hilty as his heart doctor. Please give patient a call.

## 2023-06-03 NOTE — Telephone Encounter (Signed)
Left message for patient regarding rx (was sent 8/27), that this writer sent him a MyChart message on 9/4 about [facility] (has since closed), and that I am not sure if he is referring to Dr. Eldridge Dace but need to clarify this.   MyChart message also sent. Notified him in VM of this as well.

## 2023-06-14 ENCOUNTER — Telehealth: Payer: Self-pay | Admitting: Internal Medicine

## 2023-06-14 DIAGNOSIS — H35373 Puckering of macula, bilateral: Secondary | ICD-10-CM | POA: Diagnosis not present

## 2023-06-14 NOTE — Telephone Encounter (Signed)
Pt states he would like to speak  with [place]. Please advise

## 2023-06-14 NOTE — Telephone Encounter (Signed)
Called and spoke with pt, pt stated he is a patient of Dr Eldridge Dace and he is retiring , pt want to know since he sees Dr Rennis Golden for his Cholesterol can Dr Rennis Golden be is cardiologist, advised pt that may not be a problem but I will forward the message to Dr Rennis Golden and his nurse Eileen Stanford.

## 2023-06-16 NOTE — Telephone Encounter (Signed)
LM for patient that [name] will see for general cardiology and lipids. He has a visit scheduled 11/6 with another cardiologist so advised to call in and r/s. MyChart message also sent.

## 2023-06-16 NOTE — Telephone Encounter (Signed)
Hilty, Lisette Abu, MD  You; Maisie Fus, Nyasha N13 hours ago (6:09 PM)    Yes, I can see him for general cardiology issues as well.  -[place]

## 2023-06-21 ENCOUNTER — Telehealth: Payer: Self-pay | Admitting: Neurology

## 2023-06-21 ENCOUNTER — Encounter: Payer: Self-pay | Admitting: Neurology

## 2023-06-21 ENCOUNTER — Ambulatory Visit: Payer: Medicare Other | Admitting: Neurology

## 2023-06-21 VITALS — BP 118/70 | Ht 72.0 in | Wt 219.0 lb

## 2023-06-21 DIAGNOSIS — G629 Polyneuropathy, unspecified: Secondary | ICD-10-CM | POA: Insufficient documentation

## 2023-06-21 DIAGNOSIS — S065XAA Traumatic subdural hemorrhage with loss of consciousness status unknown, initial encounter: Secondary | ICD-10-CM | POA: Diagnosis not present

## 2023-06-21 DIAGNOSIS — R269 Unspecified abnormalities of gait and mobility: Secondary | ICD-10-CM | POA: Diagnosis not present

## 2023-06-21 NOTE — Telephone Encounter (Signed)
[employer] medicare NPR sent to GI [phone] I let GI know that per Dr. Terrace Arabia, ok to cancel CT head on Oct. 2
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
## 2023-06-24 ENCOUNTER — Other Ambulatory Visit: Payer: Medicare Other

## 2023-06-28 DIAGNOSIS — D229 Melanocytic nevi, unspecified: Secondary | ICD-10-CM | POA: Diagnosis not present

## 2023-06-28 DIAGNOSIS — C44629 Squamous cell carcinoma of skin of left upper limb, including shoulder: Secondary | ICD-10-CM | POA: Diagnosis not present

## 2023-06-28 DIAGNOSIS — L821 Other seborrheic keratosis: Secondary | ICD-10-CM | POA: Diagnosis not present

## 2023-06-28 DIAGNOSIS — L820 Inflamed seborrheic keratosis: Secondary | ICD-10-CM | POA: Diagnosis not present

## 2023-06-28 DIAGNOSIS — R229 Localized swelling, mass and lump, unspecified: Secondary | ICD-10-CM | POA: Diagnosis not present

## 2023-06-28 DIAGNOSIS — D692 Other nonthrombocytopenic purpura: Secondary | ICD-10-CM | POA: Diagnosis not present

## 2023-06-28 DIAGNOSIS — Z08 Encounter for follow-up examination after completed treatment for malignant neoplasm: Secondary | ICD-10-CM | POA: Diagnosis not present

## 2023-06-28 DIAGNOSIS — L570 Actinic keratosis: Secondary | ICD-10-CM | POA: Diagnosis not present

## 2023-06-28 DIAGNOSIS — Z85828 Personal history of other malignant neoplasm of skin: Secondary | ICD-10-CM | POA: Diagnosis not present

## 2023-06-28 DIAGNOSIS — L578 Other skin changes due to chronic exposure to nonionizing radiation: Secondary | ICD-10-CM | POA: Diagnosis not present

## 2023-06-29 ENCOUNTER — Other Ambulatory Visit: Payer: Self-pay | Admitting: Interventional Cardiology

## 2023-06-29 ENCOUNTER — Other Ambulatory Visit: Payer: Medicare Other

## 2023-07-01 ENCOUNTER — Ambulatory Visit
Admission: RE | Admit: 2023-07-01 | Discharge: 2023-07-01 | Disposition: A | Discharge status: Home / Self Care | Payer: Medicare Other | Source: Ambulatory Visit | Attending: Neurology | Admitting: Neurology

## 2023-07-01 DIAGNOSIS — R269 Unspecified abnormalities of gait and mobility: Secondary | ICD-10-CM

## 2023-07-01 DIAGNOSIS — G629 Polyneuropathy, unspecified: Secondary | ICD-10-CM | POA: Diagnosis not present

## 2023-07-01 DIAGNOSIS — S065XAA Traumatic subdural hemorrhage with loss of consciousness status unknown, initial encounter: Secondary | ICD-10-CM | POA: Diagnosis not present

## 2023-07-05 DIAGNOSIS — S065XAA Traumatic subdural hemorrhage with loss of consciousness status unknown, initial encounter: Secondary | ICD-10-CM | POA: Diagnosis not present

## 2023-07-07 ENCOUNTER — Telehealth: Payer: Self-pay | Admitting: Neurology

## 2023-07-07 NOTE — Telephone Encounter (Signed)
LVM and sent mychart msg informing pt of need to reschedule 08/24/23 appt - MD out

## 2023-07-18 ENCOUNTER — Ambulatory Visit: Payer: Medicare Other | Admitting: Neurology

## 2023-07-18 DIAGNOSIS — R269 Unspecified abnormalities of gait and mobility: Secondary | ICD-10-CM

## 2023-07-18 DIAGNOSIS — S065XAA Traumatic subdural hemorrhage with loss of consciousness status unknown, initial encounter: Secondary | ICD-10-CM

## 2023-07-18 DIAGNOSIS — R410 Disorientation, unspecified: Secondary | ICD-10-CM

## 2023-07-18 DIAGNOSIS — G629 Polyneuropathy, unspecified: Secondary | ICD-10-CM

## 2023-07-20 ENCOUNTER — Telehealth: Payer: Self-pay | Admitting: Interventional Cardiology

## 2023-07-20 ENCOUNTER — Telehealth: Payer: Self-pay | Admitting: Neurology

## 2023-07-20 ENCOUNTER — Telehealth: Payer: Self-pay | Admitting: Internal Medicine

## 2023-07-20 MED ORDER — LEVETIRACETAM 500 MG PO TABS: 500.0000 mg | ORAL_TABLET | Freq: Two times a day (BID) | ORAL | 3 refills | Status: DC

## 2023-07-20 NOTE — Telephone Encounter (Signed)
Pt c/o medication issue:  1. Name of Medication: Bempedoic Acid-Ezetimibe (NEXLIZET) 180-10 MG TABS   2. How are you currently taking this medication (dosage and times per day)?   3. Are you having a reaction (difficulty breathing--STAT)?   4. What is your medication issue? Patient states that he was uncertain if he should still be taking this medication. Says there was a mix up with the pharmacy and he may have continued the wrong medication. Please advise.

## 2023-07-20 NOTE — Telephone Encounter (Signed)
Left message for patient that at 05/24/23 visit Nexletol and Zetia were combined into Nexlizet and Rx was sent to pharmacy. Unsure what issue is with med

## 2023-07-20 NOTE — Telephone Encounter (Signed)
Meds ordered this encounter  Medications  . levETIRAcetam (KEPPRA) 500 MG tablet    Sig: Take 1 tablet (500 mg total) by mouth 2 (two) times daily.    Dispense:  180 tablet    Refill:  3

## 2023-07-20 NOTE — Telephone Encounter (Signed)
Patient is currently in a donut hole and cannot afford Trelegy. He was wondering if it would be possible for him to receive samples (two months worth).

## 2023-07-20 NOTE — Telephone Encounter (Signed)
Pt called wanting to know if Dr. Terrace Arabia will fill his Keppra.  Please advise.

## 2023-07-25 DIAGNOSIS — K219 Gastro-esophageal reflux disease without esophagitis: Secondary | ICD-10-CM | POA: Diagnosis not present

## 2023-07-25 DIAGNOSIS — R7303 Prediabetes: Secondary | ICD-10-CM | POA: Diagnosis not present

## 2023-07-25 DIAGNOSIS — G459 Transient cerebral ischemic attack, unspecified: Secondary | ICD-10-CM | POA: Diagnosis not present

## 2023-07-25 DIAGNOSIS — I700 Atherosclerosis of aorta: Secondary | ICD-10-CM | POA: Diagnosis not present

## 2023-07-25 DIAGNOSIS — I77810 Thoracic aortic ectasia: Secondary | ICD-10-CM | POA: Diagnosis not present

## 2023-07-25 DIAGNOSIS — Z23 Encounter for immunization: Secondary | ICD-10-CM | POA: Diagnosis not present

## 2023-07-25 DIAGNOSIS — J439 Emphysema, unspecified: Secondary | ICD-10-CM | POA: Diagnosis not present

## 2023-07-25 DIAGNOSIS — E782 Mixed hyperlipidemia: Secondary | ICD-10-CM | POA: Diagnosis not present

## 2023-07-26 NOTE — Telephone Encounter (Signed)
Called pt no answer. Lvmm for pt to give the office a call back.

## 2023-07-27 NOTE — Telephone Encounter (Signed)
Patient is returning phone call. Patient phone number is [phone].

## 2023-07-28 NOTE — Telephone Encounter (Signed)
Called pt, no answer. Lvmm for pt to call the office back if he would like to pick up samples.

## 2023-07-29 MED ORDER — TRELEGY ELLIPTA 100-62.5-25 MCG/ACT IN AEPB: 1.0000 | INHALATION_SPRAY | Freq: Every day | RESPIRATORY_TRACT | Status: DC

## 2023-07-29 NOTE — Telephone Encounter (Signed)
Called and spoke with the pt  He is in the donut hole and out of trelegy 100  I left him a sample at front  Nothing further needed

## 2023-08-01 ENCOUNTER — Ambulatory Visit: Payer: Medicare Other | Admitting: Interventional Cardiology

## 2023-08-02 ENCOUNTER — Other Ambulatory Visit: Payer: Self-pay | Admitting: Internal Medicine

## 2023-08-02 MED ORDER — NEXLIZET 180-10 MG PO TABS: 1.0000 | ORAL_TABLET | Freq: Every day | ORAL | 9 refills | Status: DC

## 2023-08-03 ENCOUNTER — Ambulatory Visit: Payer: Medicare Other | Admitting: Internal Medicine

## 2023-08-03 NOTE — Progress Notes (Deleted)
Cardiology Office Note:  .    Date:  08/03/2023  ID:  [name] Reveal, DOB 1946-06-14, MRN [id number] PCP: Emilio Aspen, MD  [facility] Providers Cardiologist:  Lance Muss, MD { Click to update primary MD,subspecialty MD or APP then REFRESH:1}    CC: *** Consulted for the evaluation of *** at the behest of ***   History of Present Illness: [name]    Rodney [name] is a 77 y.o. male ***    Relevant histories: .  Social *** ROS: As per HPI.   Studies Reviewed: .   Cardiac Studies & Procedures     STRESS TESTS  MYOCARDIAL PERFUSION IMAGING 02/27/2018  Narrative  Nuclear stress EF: 50%.  There was no ST segment deviation noted during stress.  This is a low risk study.  The left ventricular ejection fraction is mildly decreased (45-54%).  1. EF 50% with diffuse hypokinesis (no discrete wall motion abnormalities). 2. Medium-sized, mild basal to mid inferior/inferoseptal perfusion defect.  Small, mild mid anteroseptal perfusion defect.  Both defects were fixed, no evidence for ischemia.  In the absence of discrete wall motion abnormalities, possible soft tissue attenuation.  Overall low risk study.  Would get echo to confirm EF.   ECHOCARDIOGRAM  ECHOCARDIOGRAM COMPLETE 10/08/2020  Narrative ECHOCARDIOGRAM REPORT    Patient Name:   [name] Date of Exam: 10/08/2020 Medical Rec #:  [id number]         Height:       72.0 in Accession #:    [id number]        Weight:       237.6 lb Date of Birth:  07/31/1946         BSA:          2.292 m Patient Age:    74 years          BP:           118/78 mmHg Patient Gender: M                 HR:           65 bpm. Exam Location:  [address]  Procedure: 2D Echo, Cardiac Doppler and Color Doppler  Indications:    J43.9 Pulmonary emphysema  History:        Patient has no prior history of Echocardiogram examinations. Pulmonary emphysema, Signs/Symptoms:Shortness of Breath; Risk Factors:Obesity,  Current Smoker, Hypertension and Dyslipidemia.  Sonographer:    Samule Ohm RDCS Referring Phys: [phone] [facility]   Sonographer Comments: Suboptimal parasternal window. IMPRESSIONS   1. Left ventricular ejection fraction, by estimation, is 50 to 55%. The left ventricle has low normal function. The left ventricle has no regional wall motion abnormalities. There is mild concentric left ventricular hypertrophy. Left ventricular diastolic parameters are consistent with Grade I diastolic dysfunction (impaired relaxation). 2. Right ventricular systolic function is normal. The right ventricular size is normal. 3. The mitral valve is normal in structure. Trivial mitral valve regurgitation. 4. The aortic valve is tricuspid. There is moderate calcification of the aortic valve. There is moderate thickening of the aortic valve. Aortic valve regurgitation is mild. Mild aortic valve stenosis. 5. Aortic dilatation noted. There is mild dilatation of the aortic root, measuring 38 mm. There is mild dilatation of the ascending aorta, measuring 37 mm. 6. The inferior vena cava is normal in size with greater than 50% respiratory variability, suggesting right atrial pressure of 3 mmHg.  Comparison(s): No prior Echocardiogram.  FINDINGS Left Ventricle: Left ventricular ejection fraction, by estimation, is 50 to 55%. The left ventricle has low normal function. The left ventricle has no regional wall motion abnormalities. The left ventricular internal cavity size was normal in size. There is mild concentric left ventricular hypertrophy. Abnormal (paradoxical) septal motion, consistent with left bundle branch block. Left ventricular diastolic parameters are consistent with Grade I diastolic dysfunction (impaired relaxation).  Right Ventricle: The right ventricular size is normal. No increase in right ventricular wall thickness. Right ventricular systolic function is normal.  Left Atrium: Left atrial size  was normal in size.  Right Atrium: Right atrial size was normal in size.  Pericardium: There is no evidence of pericardial effusion.  Mitral Valve: The mitral valve is normal in structure. Trivial mitral valve regurgitation.  Tricuspid Valve: The tricuspid valve is normal in structure. Tricuspid valve regurgitation is trivial.  Aortic Valve: The aortic valve is tricuspid. There is moderate calcification of the aortic valve. There is moderate thickening of the aortic valve. Aortic valve regurgitation is mild. Aortic regurgitation PHT measures 529 msec. Mild aortic stenosis is present. Aortic valve mean gradient measures 12.0 mmHg. Aortic valve peak gradient measures 24.2 mmHg. Aortic valve area, by VTI measures 1.67 cm.  Pulmonic Valve: The pulmonic valve was normal in structure. Pulmonic valve regurgitation is not visualized.  Aorta: Aortic dilatation noted. There is mild dilatation of the aortic root, measuring 38 mm. There is mild dilatation of the ascending aorta, measuring 37 mm.  Venous: The inferior vena cava is normal in size with greater than 50% respiratory variability, suggesting right atrial pressure of 3 mmHg.  IAS/Shunts: No atrial level shunt detected by color flow Doppler.   LEFT VENTRICLE PLAX 2D LVIDd:         5.60 cm  Diastology LVIDs:         4.30 cm  LV e' medial:    5.00 cm/s LV PW:         1.20 cm  LV E/e' medial:  12.6 LV IVS:        1.30 cm  LV e' lateral:   10.20 cm/s LVOT diam:     2.20 cm  LV E/e' lateral: 6.2 LV SV:         81 LV SV Index:   35 LVOT Area:     3.80 cm   RIGHT VENTRICLE             IVC RV S prime:     11.70 cm/s  IVC diam: 1.40 cm TAPSE (M-mode): 1.3 cm  LEFT ATRIUM             Index       RIGHT ATRIUM           Index LA diam:        3.90 cm 1.70 cm/m  RA Pressure: 3.00 mmHg LA Vol (A2C):   55.6 ml 24.25 ml/m RA Area:     15.50 cm LA Vol (A4C):   53.1 ml 23.16 ml/m RA Volume:   43.00 ml  18.76 ml/m LA Biplane Vol: 58.4 ml  25.48 ml/m AORTIC VALVE AV Area (Vmax):    1.62 cm AV Area (Vmean):   1.69 cm AV Area (VTI):     1.67 cm AV Vmax:           246.00 cm/s AV Vmean:          166.000 cm/s AV VTI:  0.486 m AV Peak Grad:      24.2 mmHg AV Mean Grad:      12.0 mmHg LVOT Vmax:         105.00 cm/s LVOT Vmean:        73.900 cm/s LVOT VTI:          0.213 m LVOT/AV VTI ratio: 0.44 AI PHT:            529 msec  AORTA Ao Root diam: 3.50 cm Ao Asc diam:  3.70 cm  MV E velocity: 62.90 cm/s  TRICUSPID VALVE MV A velocity: 85.90 cm/s  Estimated RAP:  3.00 mmHg MV E/A ratio:  0.73 SHUNTS Systemic VTI:  0.21 m Systemic Diam: 2.20 cm  Laurance Flatten MD Electronically signed by Laurance Flatten MD Signature Date/Time: 10/08/2020/1:39:03 PM    Final     CT SCANS  CT CORONARY MORPH W/CTA COR W/SCORE 04/05/2022  Addendum 04/07/2022 11:35 AM ADDENDUM REPORT: 04/07/2022 11:32  EXAM: OVER-READ INTERPRETATION  CT CHEST  The following report is an over-read performed by radiologist Dr. [facility] on 04/07/2022. This over-read does not include interpretation of cardiac or coronary anatomy or pathology. The coronary CTA interpretation by the cardiologist is attached.  COMPARISON:  Chest CT 01/25/2022  FINDINGS: The thoracic aorta is normal in caliber. Scattered atherosclerotic calcifications.  No mediastinal or hilar mass or adenopathy. The esophagus is grossly normal.  Chronic lung changes but no pulmonary lesions or pulmonary nodules.  The upper abdomen is grossly normal.  No acute bony findings. Numerous remote healed left rib fractures are noted.  IMPRESSION: Chronic lung changes but no acute pulmonary findings or pulmonary nodules.   Electronically Signed By: Rudie Meyer M.D. On: 04/07/2022 11:32  Narrative CLINICAL DATA:  Chest pain  EXAM: Cardiac/Coronary CTA  TECHNIQUE: A non-contrast, gated CT scan was obtained with axial slices  of 3 mm through the heart for calcium scoring. Calcium scoring was performed using the Agatston method. A 120 kV prospective, gated, contrast cardiac scan was obtained. Gantry rotation speed was 250 msecs and collimation was 0.6 mm. Two sublingual nitroglycerin tablets (0.8 mg) were given. The 3D data set was reconstructed in 5% intervals of the 35-75% of the R-R cycle. Diastolic phases were analyzed on a dedicated workstation using MPR, MIP, and VRT modes. The patient received 95 cc of contrast.  FINDINGS: Image quality: Excellent.  Noise artifact is: Limited.  Coronary Arteries:  Normal coronary origin.  Right dominance.  Left main: The left main is a large caliber vessel with a normal take off from the left coronary cusp that bifurcates to form a left anterior descending artery and a left circumflex artery. There is no plaque or stenosis.  Left anterior descending artery: The LAD gives off 2 patent diagonal branches. There is mild mixed plaque in the proximal and mid LAD with associated stenosis of 25-49%. There is moderate calcified plaque in the superior branch of the large branching D1 with associated stenosis of 50-69%.  Left circumflex artery: The LCX is non-dominant and patent with no evidence of plaque or stenosis. The LCX gives off 2 patent obtuse marginal branches.  Right coronary artery: The RCA is dominant with normal take off from the right coronary cusp. There is mild calcified plaque in the proximal, mid and distal RCA with associated stenosis of 25-49%. The RCA terminates as a PDA and right posterolateral branch without evidence of plaque or stenosis.  Right Atrium: Right atrial size is within  normal limits.  Right Ventricle: The right ventricular cavity is within normal limits.  Left Atrium: Left atrial size is normal in size with no left atrial appendage filling defect.  Left Ventricle: The ventricular cavity size is within normal limits. There are  no stigmata of prior infarction. There is no abnormal filling defect.  Pulmonary arteries: Normal in size without proximal filling defect.  Pulmonary veins: Normal pulmonary venous drainage.  Pericardium: Normal thickness with no significant effusion or calcium present.  Cardiac valves: The aortic valve is trileaflet without significant calcification. The mitral valve is normal structure without significant calcification.  Aorta: Normal caliber with no significant disease.  Extra-cardiac findings: See attached radiology report for non-cardiac structures.  IMPRESSION: 1. Coronary calcium score of 754. This was 71st percentile for age-, sex, and race-matched controls.  2.  Normal coronary origin with right dominance.  3.  Moderate atherosclerosis of the LAD/Diagonal.  CAD RADS 3.  4. Consider symptom-guided anti-ischemic and preventive pharmacotherapy as well as risk factor modification per guideline-directed care.  5.  This study has been submitted for FFR analysis.  RECOMMENDATIONS: 1. CAD-RADS 0: No evidence of CAD (0%). Consider non-atherosclerotic causes of chest pain.  2. CAD-RADS 1: Minimal non-obstructive CAD (0-24%). Consider non-atherosclerotic causes of chest pain. Consider preventive therapy and risk factor modification.  3. CAD-RADS 2: Mild non-obstructive CAD (25-49%). Consider non-atherosclerotic causes of chest pain. Consider preventive therapy and risk factor modification.  4. CAD-RADS 3: Moderate stenosis. Consider symptom-guided anti-ischemic pharmacotherapy as well as risk factor modification per guideline directed care. Additional analysis with CT FFR will be submitted.  5. CAD-RADS 4: Severe stenosis. (70-99% or > 50% left main). Cardiac catheterization or CT FFR is recommended. Consider symptom-guided anti-ischemic pharmacotherapy as well as risk factor modification per guideline directed care. Invasive coronary angiography recommended with  revascularization per published guideline statements.  6. CAD-RADS 5: Total coronary occlusion (100%). Consider cardiac catheterization or viability assessment. Consider symptom-guided anti-ischemic pharmacotherapy as well as risk factor modification per guideline directed care.  7. CAD-RADS N: Non-diagnostic study. Obstructive CAD can't be excluded. Alternative evaluation is recommended.  Armanda Magic, MD  Electronically Signed: By: Armanda Magic M.D. On: 04/07/2022 11:12          *** Risk Assessment/Calculations:    {Does this patient have ATRIAL FIBRILLATION?:[phone]}  {This patient may be at risk for Amyloid. He has one or more dx on the problem list or PMH from the following list - Abnormal EKG, CHF, Aortic Stenosis, Proteinuria, LVH, Carpal Tunnel Syndrome, Biceps Tendon Rupture, Syncope. See list below or review PMH.    Click HERE to open Cardiac Amyloid Screening SmartSet to order screening OR Click HERE to defer testing for 1 year or permanently :1}    Physical Exam:    VS:  There were no vitals taken for this visit.   Wt Readings from Last 3 Encounters:  06/21/23 219 lb (99.3 kg)  05/24/23 217 lb 9.6 oz (98.7 kg)  04/17/23 230 lb (104.3 kg)    Gen: *** distress, *** obese/well nourished/malnourished   Neck: No JVD, *** carotid bruit Ears: *** Frank Sign Cardiac: No Rubs or Gallops, *** Murmur, ***cardia, *** radial pulses Respiratory: Clear to auscultation bilaterally, *** effort, ***  respiratory rate GI: Soft, nontender, non-distended *** MS: No *** edema; *** moves all extremities Integument: Skin feels *** Neuro:  At time of evaluation, alert and oriented to person/place/time/situation *** Psych: Normal affect, patient feels ***   ASSESSMENT AND PLAN: .    ***  Riley Lam, MD FASE [facility] Cardiologist [facility]  [address] [place], [address] [place], [place] [address] [phone]  9:56 AM

## 2023-08-05 ENCOUNTER — Telehealth: Payer: Self-pay | Admitting: Internal Medicine

## 2023-08-05 NOTE — Telephone Encounter (Signed)
Pt c/o medication issue:  1. Name of Medication:   Bempedoic Acid-Ezetimibe (NEXLIZET) 180-10 MG TABS    2. How are you currently taking this medication (dosage and times per day)? Take 1 tablet by mouth daily.   3. Are you having a reaction (difficulty breathing--STAT)? No  4. What is your medication issue? Pt and pharmacy called stating that medication is needing Prior Auth before they can fill. Pt states that he has only 2 tablets left as well as he would like a c/b from nurse. Please advise

## 2023-08-06 ENCOUNTER — Telehealth: Payer: Self-pay | Admitting: Neurology

## 2023-08-06 DIAGNOSIS — R9431 Abnormal electrocardiogram [ECG] [EKG]: Secondary | ICD-10-CM

## 2023-08-06 NOTE — Telephone Encounter (Signed)
Please call patient, EEG showed no significant abnormality but there is evidence of irregular cardiac rhythm, he was seen by cardiologist [name] on May 24, 2023, for coronary artery disease, aortic atherosclerosis, had EKG in July,but I do not have access to the report.

## 2023-08-06 NOTE — Procedures (Addendum)
   HISTORY: 77 year old male with history of bur hole, transient dizziness  TECHNIQUE:  This is a routine 16 channel EEG recording with one channel devoted to a limited EKG recording.  It was performed during wakefulness, drowsiness and asleep.  Photic stimulation were performed as activating procedures.  There are minimum muscle and movement artifact noted.  Upon maximum arousal, posterior dominant waking rhythm consistent of rhythmic alpha range activity. Activities are symmetric over the bilateral posterior derivations and attenuated with eye opening.  Photic stimulation did not alter the tracing.  Hyperventilation was not performed due to COPD  During EEG recording, patient developed drowsiness and no deeper stage of sleep was achieved During EEG recording, there was no epileptiform discharge noted.  EKG demonstrate irregular heart rhythm.  CONCLUSION: This is a  normal awake EEG.  There is no electrodiagnostic evidence of epileptiform discharge.  Levert Feinstein, M.D. Ph.D.  [facility] [address] [place], [place] [address] Phone: [phone] Fax:      [phone]

## 2023-08-08 ENCOUNTER — Other Ambulatory Visit (HOSPITAL_COMMUNITY): Payer: Self-pay

## 2023-08-08 ENCOUNTER — Telehealth: Payer: Self-pay | Admitting: Pharmacy Technician

## 2023-08-08 NOTE — Telephone Encounter (Signed)
Pharmacy Patient Advocate Encounter  Received notification from [facility] that Prior Authorization for nexlizet has been APPROVED from 08/08/23 to 02/05/24. Ran test claim, Copay is $102.66- one month. This test claim was processed through [facility]- copay amounts may vary at other pharmacies due to pharmacy/plan contracts, or as the patient moves through the different stages of their insurance plan.   PA #/Case ID/Reference #: [id number]

## 2023-08-08 NOTE — Telephone Encounter (Signed)
Pharmacy Patient Advocate Encounter   Received notification from Pt Calls Messages that prior authorization for nexlizet is required/requested.   Insurance verification completed.   The patient is insured through [facility] .   Per test claim: PA required; PA submitted to above mentioned insurance via CoverMyMeds Key/confirmation #/EOC [id number] Status is pending

## 2023-08-08 NOTE — Telephone Encounter (Signed)
Left message to call back  

## 2023-08-10 ENCOUNTER — Ambulatory Visit: Payer: Medicare Other | Attending: Internal Medicine

## 2023-08-10 ENCOUNTER — Other Ambulatory Visit: Payer: Self-pay | Admitting: Internal Medicine

## 2023-08-10 DIAGNOSIS — R269 Unspecified abnormalities of gait and mobility: Secondary | ICD-10-CM

## 2023-08-10 DIAGNOSIS — I452 Bifascicular block: Secondary | ICD-10-CM

## 2023-08-10 DIAGNOSIS — R9431 Abnormal electrocardiogram [ECG] [EKG]: Secondary | ICD-10-CM

## 2023-08-10 DIAGNOSIS — I499 Cardiac arrhythmia, unspecified: Secondary | ICD-10-CM

## 2023-08-10 DIAGNOSIS — I440 Atrioventricular block, first degree: Secondary | ICD-10-CM

## 2023-08-10 DIAGNOSIS — I480 Paroxysmal atrial fibrillation: Secondary | ICD-10-CM

## 2023-08-10 NOTE — Progress Notes (Unsigned)
Enrolled for [employer] to mail a ZIO XT long term holter monitor to the patients address on file.   Original monitor fell off golfing/ lost. Replacement [id number] cancelled. Applied office inventory [id number] from office inventory using tincture of benzoin. Murriel Hopper at [facility] notified of changes.

## 2023-08-10 NOTE — Addendum Note (Signed)
Addended by: Lindell Spar on: 08/10/2023 11:05 AM   Modules accepted: Orders

## 2023-08-10 NOTE — Telephone Encounter (Signed)
Levert Feinstein, MD  Chrystie Nose, MD2 days ago    Thank you [name].    Chrystie Nose, MD  You; Levert Feinstein, MD3 days ago    Dr. Terrace Arabia-  I have followed him for cholesterol - Dr. Eldridge Dace sees him for general cardiology, however, he has left the practice recently. Not sure who will take over. Since he has some arryhtmia noted on his EEG, will be happy to order a 2 week zio monitor to assess for possible afib.  We will contact him and send him the monitor to wear.  Thanks for reaching out.  Dr. Holley Dexter, MD  Chrystie Nose, MD4 days ago    [name]:  On his EEG recording, cardiac lead showed irregular heart rhythm. Would this need to be looked into more? Thanks. Vivia Ewing

## 2023-08-10 NOTE — Telephone Encounter (Signed)
Spoke with patient about monitor. He agrees w/plan. Monitor ordered - message sent to [facility]. Lipid panel order mailed - due before 09/26/23 appt.

## 2023-08-17 ENCOUNTER — Telehealth (HOSPITAL_BASED_OUTPATIENT_CLINIC_OR_DEPARTMENT_OTHER): Payer: Self-pay | Admitting: Interventional Cardiology

## 2023-08-17 NOTE — Telephone Encounter (Signed)
Routed to monitor team for assistance with lost zio

## 2023-08-17 NOTE — Telephone Encounter (Signed)
Patient stopped by [facility] to let us know that his Zio fell off a few days after placement and he "stuck it back on" to the best of his abilities but it fell off again when he was playing golf and he lost the device. Spoke with RN Regis Bill and she suggested that the patient reach out to the [facility] customer service line for assistance in getting a replacement device. Relayed that information to the patient and while he was grateful, he was also wondering if there is anything else he should be doing or if there is a better way to collect the data since the device wasn't comfortable. Reassured the patient that while the Zio can be a challenge, the information it collects is very helpful. Let patient know that we would communicate both the loss of the Zio as well as his concerns to [name].

## 2023-08-18 NOTE — Telephone Encounter (Signed)
From Andee Lineman  I can contact Rodney [name] at [facility] to ship him another ZIO patch and/or cancel the first one.  An alternative would be to put in a new order for a [employer] Long term monitor.  He would have a few strips in case one fell off. It is not quite as easy to use as the ZIO, but he could wear it for a longer period of time.    Patient would ship device back via box with prepaid return UPS shipping label on it.

## 2023-08-18 NOTE — Telephone Encounter (Signed)
Spoke with patient about monitor. He said [employer] is shipping him a new one. He would like to come in to have the monitor placed -- advised can be coordinated with monitor team. He should call min office # and ask for monitor department.   He asked about the labs due before his 12/30 visit. Advised he should complete his lab work before his visit OR week before Christmas is fine.

## 2023-08-22 ENCOUNTER — Telehealth (HOSPITAL_BASED_OUTPATIENT_CLINIC_OR_DEPARTMENT_OTHER): Payer: Self-pay | Admitting: Internal Medicine

## 2023-08-22 NOTE — Telephone Encounter (Signed)
Patient needs an appointment to have his ZIO put on, please advise.

## 2023-08-22 NOTE — Telephone Encounter (Signed)
Patient scheduled to come to our [facility] office 08/22/23, 4:00PM to have the new smaller version of ZIO applied using tincture of benzoin.  Contact [employer] to cancel [id number] and assign new serial # of applied monitor.

## 2023-08-23 ENCOUNTER — Ambulatory Visit: Payer: Medicare Other | Attending: Cardiovascular Disease

## 2023-08-23 DIAGNOSIS — I499 Cardiac arrhythmia, unspecified: Secondary | ICD-10-CM

## 2023-08-23 DIAGNOSIS — I452 Bifascicular block: Secondary | ICD-10-CM | POA: Diagnosis not present

## 2023-08-23 DIAGNOSIS — I480 Paroxysmal atrial fibrillation: Secondary | ICD-10-CM

## 2023-08-23 DIAGNOSIS — I440 Atrioventricular block, first degree: Secondary | ICD-10-CM | POA: Diagnosis not present

## 2023-08-23 DIAGNOSIS — R9431 Abnormal electrocardiogram [ECG] [EKG]: Secondary | ICD-10-CM

## 2023-08-24 ENCOUNTER — Encounter: Payer: Medicare Other | Admitting: Neurology

## 2023-09-13 DIAGNOSIS — I499 Cardiac arrhythmia, unspecified: Secondary | ICD-10-CM | POA: Diagnosis not present

## 2023-09-13 DIAGNOSIS — I452 Bifascicular block: Secondary | ICD-10-CM | POA: Diagnosis not present

## 2023-09-15 DIAGNOSIS — I2584 Coronary atherosclerosis due to calcified coronary lesion: Secondary | ICD-10-CM | POA: Diagnosis not present

## 2023-09-15 DIAGNOSIS — I2510 Atherosclerotic heart disease of native coronary artery without angina pectoris: Secondary | ICD-10-CM | POA: Diagnosis not present

## 2023-09-15 DIAGNOSIS — E785 Hyperlipidemia, unspecified: Secondary | ICD-10-CM | POA: Diagnosis not present

## 2023-09-16 LAB — LIPID PANEL
Chol/HDL Ratio: 2.6 {ratio} (ref 0.0–5.0)
Cholesterol, Total: 156 mg/dL (ref 100–199)
HDL: 61 mg/dL (ref >39)
LDL Chol Calc (NIH): 76 mg/dL (ref 0–99)
Triglycerides: 102 mg/dL (ref 0–149)
VLDL Cholesterol Cal: 19 mg/dL (ref 5–40)

## 2023-09-26 ENCOUNTER — Ambulatory Visit (HOSPITAL_BASED_OUTPATIENT_CLINIC_OR_DEPARTMENT_OTHER)
Admission: RE | Admit: 2023-09-26 | Discharge: 2023-09-26 | Disposition: A | Discharge status: Home / Self Care | Payer: Medicare Other | Source: Ambulatory Visit | Attending: Internal Medicine | Admitting: Internal Medicine

## 2023-09-26 ENCOUNTER — Ambulatory Visit: Payer: Medicare Other | Admitting: Internal Medicine

## 2023-09-26 ENCOUNTER — Telehealth: Payer: Self-pay | Admitting: Neurology

## 2023-09-26 ENCOUNTER — Encounter: Payer: Self-pay | Admitting: Internal Medicine

## 2023-09-26 VITALS — BP 126/78 | HR 110 | Ht 71.0 in | Wt 229.6 lb

## 2023-09-26 DIAGNOSIS — I440 Atrioventricular block, first degree: Secondary | ICD-10-CM | POA: Diagnosis not present

## 2023-09-26 DIAGNOSIS — J849 Interstitial pulmonary disease, unspecified: Secondary | ICD-10-CM | POA: Diagnosis not present

## 2023-09-26 DIAGNOSIS — J8410 Pulmonary fibrosis, unspecified: Secondary | ICD-10-CM | POA: Diagnosis not present

## 2023-09-26 DIAGNOSIS — R918 Other nonspecific abnormal finding of lung field: Secondary | ICD-10-CM | POA: Diagnosis not present

## 2023-09-26 DIAGNOSIS — I10 Essential (primary) hypertension: Secondary | ICD-10-CM

## 2023-09-26 DIAGNOSIS — E785 Hyperlipidemia, unspecified: Secondary | ICD-10-CM | POA: Insufficient documentation

## 2023-09-26 DIAGNOSIS — I499 Cardiac arrhythmia, unspecified: Secondary | ICD-10-CM | POA: Diagnosis not present

## 2023-09-26 DIAGNOSIS — K224 Dyskinesia of esophagus: Secondary | ICD-10-CM | POA: Diagnosis not present

## 2023-09-26 DIAGNOSIS — I480 Paroxysmal atrial fibrillation: Secondary | ICD-10-CM

## 2023-09-26 DIAGNOSIS — I452 Bifascicular block: Secondary | ICD-10-CM

## 2023-09-26 MED ORDER — METOPROLOL SUCCINATE ER 100 MG PO TB24: 100.0000 mg | ORAL_TABLET | Freq: Every day | ORAL | 3 refills | Status: DC
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
## 2023-09-26 NOTE — Patient Instructions (Signed)
Medication Instructions:   INCREASE metoprolol succinate once daily to 100mg  once daily  You can apply for a grant from the [facility] to help with the cost of Nexlizet.  The [facility] offers assistance to help pay for medication copays.  They will cover copays for all cholesterol lowering meds, including statins, fibrates, omega-3 fish oils like Vascepa, ezetimibe, Repatha, Praluent, Nexletol, Nexlizet.  The cards are usually good for $2,500 or 12 months, whichever comes first. Our fax # is [phone] (you will need this to apply) Go to [email or web address] Click on "Apply Now" Answer questions as to whom is applying (patient or representative) Your disease fund will be "hypercholesterolemia - Medicare access" They will ask questions about finances and which medications you are taking for cholesterol When you submit, the approval is usually within minutes.  You will need to print the card information from the site You will need to show this information to your pharmacy, they will bill your Medicare Part D plan first -then bill [employer] --for the copay.   You can also call them at [phone], although the hold times can be quite long.     *If you need a refill on your cardiac medications before your next appointment, please call your pharmacy*   Lab Work: FASTING lab work to check cholesterol about a week before next visit  If you have labs (blood work) drawn today and your tests are completely normal, you will receive your results only by: MyChart Message (if you have MyChart) OR A paper copy in the mail If you have any lab test that is abnormal or we need to change your treatment, we will call you to review the results.   Follow-Up: At [facility], you and your health needs are our priority.  As part of our continuing mission to provide you with exceptional heart care, we have created designated Provider Care Teams.  These Care Teams  include your primary Cardiologist (physician) and Advanced Practice Providers (APPs -  Physician Assistants and Nurse Practitioners) who all work together to provide you with the care you need, when you need it.  We recommend signing up for the patient portal called "MyChart".  Sign up information is provided on this After Visit Summary.  MyChart is used to connect with patients for Virtual Visits (Telemedicine).  Patients are able to view lab/test results, encounter notes, upcoming appointments, etc.  Non-urgent messages can be sent to your provider as well.   To learn more about what you can do with MyChart, go to [email or web address].    Your next appointment:    AS SCHEDULED in February

## 2023-09-26 NOTE — Telephone Encounter (Signed)
Chrystie Nose, MD  Lindell Spar, RN Cc: Levert Feinstein, MD Monitor did not show afib. There were multiple short episodes of PSVT - if he is aware of these, could increase metoprolol to try to suppress them. Otherwise, if he is asymptomatic, would not need to adjust therapy.  Dr Rennis Golden

## 2023-09-30 ENCOUNTER — Telehealth: Payer: Self-pay | Admitting: Pulmonary Disease

## 2023-09-30 NOTE — Telephone Encounter (Signed)
 Patient called answering service  Trelegy has become prohibitive  Needs samples  Encouraged him to call the office on Monday morning  May also discuss switching to a different inhaler that may be more affordable for him

## 2023-10-05 ENCOUNTER — Ambulatory Visit: Payer: Medicare Other | Admitting: Neurology

## 2023-10-05 ENCOUNTER — Ambulatory Visit: Payer: Self-pay | Admitting: Neurology

## 2023-10-05 VITALS — BP 126/84 | HR 77 | Ht 72.0 in

## 2023-10-05 DIAGNOSIS — R269 Unspecified abnormalities of gait and mobility: Secondary | ICD-10-CM

## 2023-10-05 DIAGNOSIS — S065XAA Traumatic subdural hemorrhage with loss of consciousness status unknown, initial encounter: Secondary | ICD-10-CM

## 2023-10-05 DIAGNOSIS — G629 Polyneuropathy, unspecified: Secondary | ICD-10-CM

## 2023-10-05 DIAGNOSIS — Z0289 Encounter for other administrative examinations: Secondary | ICD-10-CM

## 2023-10-05 NOTE — Procedures (Signed)
 Full Name: Rodney [name] Gender: Male MRN #: [id number] Date of Birth: Apr 01, 1946    Visit Date: 10/05/2023 13:36 Age: 78 Years Examining Physician: Dr. Modena Callander Referring Physician: Dr. Modena Callander Height: 6 feet 0 inch History: 78 year old male with history of left subdural hematoma status post bur hole in July 2024, presenting with lower extremity paresthesia, dizziness unsteady gait  Summary of the test: Nerve conduction study: Bilateral sural, superficial peroneal sensory responses showed mildly decreased snap amplitude.  Left tibial motor responses were normal.  Right tibial motor responses showed moderately decreased CMAP amplitude.  Right peroneal to EDB motor response was absent.  Left peroneal to EDB motor responses were normal  Electromyography: Selected needle examinations of bilateral lower extremity muscles, and lumbosacral paraspinal muscles were performed.  There is evidence of mild chronic neuropathic changes involving bilateral L4-5 myotomes right worse than left.  There was no spontaneous activity at bilateral lumbosacral radiculopathy   Conclusion: This is an abnormal study.  There is electrodiagnostic evidence of mild axonal sensorimotor polyneuropathy, with superimposed mild bilateral lumbosacral radiculopathy, right is more obvious than the left.   Modena Callander. M.D. Ph.D.   [facility] [address], [address] [place], [place] [address] Tel: [phone] Fax: [phone]  Verbal informed consent was obtained from the patient, patient was informed of potential risk of procedure, including bruising, bleeding, hematoma formation, infection, muscle weakness, muscle pain, numbness, among others.        MNC    Nerve / Sites Muscle Latency Ref. Amplitude Ref. Rel Amp Segments Distance Velocity Ref. Area    ms ms mV mV %  cm m/s m/s mVms  L Peroneal - EDB     Ankle EDB 5.1 <=6.5 2.9 >=2.0 100 Ankle - EDB 9   7.2     Fib head EDB  12.3  2.3  78.9 Fib head - Ankle 29 40 >=44 7.4     Pop fossa EDB 14.9  2.5  110 Pop fossa - Fib head 11 41 >=44 8.2         Pop fossa - Ankle      R Peroneal - EDB     Ankle EDB NR <=6.5 NR >=2.0 NR Ankle - EDB 9   NR         Pop fossa - Ankle      L Tibial - AH     Ankle AH 5.2 <=5.8 6.5 >=4.0 100 Ankle - AH 9   14.4     Pop fossa AH 13.2  7.4  113 Pop fossa - Ankle 38 48 >=41 21.3  R Tibial - AH     Ankle AH 4.6 <=5.8 2.9 >=4.0 100 Ankle - AH 9   10.0     Pop fossa AH 15.7  1.7  58.2 Pop fossa - Ankle 39 35 >=41 7.5             SNC    Nerve / Sites Rec. Site Peak Lat Ref.  Amp Ref. Segments Distance    ms ms V V  cm  L Sural - Ankle (Calf)     Calf Ankle 4.1 <=4.4 5 >=6 Calf - Ankle 14  R Sural - Ankle (Calf)     Calf Ankle 2.7 <=4.4 6 >=6 Calf - Ankle 10  L Superficial peroneal - Ankle     Lat leg Ankle 4.2 <=4.4 4 >=6 Lat leg - Ankle 14  R Superficial peroneal - Ankle  Lat leg Ankle 3.7 <=4.4 3 >=6 Lat leg - Ankle 14             F  Wave    Nerve F Lat Ref.   ms ms  L Tibial - AH 55.6 <=56.0  R Tibial - AH 56.6 <=56.0         EMG Summary Table    Spontaneous MUAP Recruitment  Muscle IA Fib PSW Fasc Other Amp Dur. Poly Pattern  R. Tibialis anterior Normal None None None _______ Normal Normal Normal Reduced  R. Peroneus longus Normal None None None _______ Normal Normal Normal Reduced  R. Vastus lateralis Normal None None None _______ Normal Normal Normal Normal  R. Gastrocnemius (Medial head) Normal None None None _______ Normal Normal Normal Normal  R. Tibialis posterior Normal None None None _______ Normal Normal Normal Reduced  L. Tibialis anterior Normal None None None _______ Normal Normal Normal Reduced  L. Peroneus longus Normal None None None _______ Normal Normal Normal Reduced  L. Tibialis posterior Normal None None None _______ Normal Normal Normal Normal  L. Gastrocnemius (Medial head) Normal None None None _______ Normal Normal Normal Normal  L.  Vastus lateralis Normal None None None _______ Normal Normal Normal Normal  R. Lumbar paraspinals (low) Normal None None None _______ Normal Normal Normal Normal  R. Lumbar paraspinals (mid) Normal None None None _______ Normal Normal Normal Normal  L. Lumbar paraspinals (low) Normal None None None _______ Normal Normal Normal Normal  L. Lumbar paraspinals (mid) Normal None None None _______ Normal Normal Normal Normal
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
## 2023-10-07 NOTE — Progress Notes (Signed)
 EMG report is under procedure tab

## 2023-10-26 ENCOUNTER — Other Ambulatory Visit: Payer: Self-pay | Admitting: Internal Medicine

## 2023-11-08 DIAGNOSIS — E785 Hyperlipidemia, unspecified: Secondary | ICD-10-CM | POA: Diagnosis not present

## 2023-11-09 LAB — LIPID PANEL
Chol/HDL Ratio: 2.3 {ratio} (ref 0.0–5.0)
Cholesterol, Total: 151 mg/dL (ref 100–199)
HDL: 65 mg/dL (ref >39)
LDL Chol Calc (NIH): 73 mg/dL (ref 0–99)
Triglycerides: 67 mg/dL (ref 0–149)
VLDL Cholesterol Cal: 13 mg/dL (ref 5–40)

## 2023-11-17 ENCOUNTER — Encounter (HOSPITAL_BASED_OUTPATIENT_CLINIC_OR_DEPARTMENT_OTHER): Payer: Self-pay

## 2023-11-22 ENCOUNTER — Ambulatory Visit (HOSPITAL_BASED_OUTPATIENT_CLINIC_OR_DEPARTMENT_OTHER): Payer: Medicare Other | Admitting: Internal Medicine

## 2023-11-22 VITALS — BP 110/70 | HR 68 | Ht 72.0 in | Wt 227.0 lb

## 2023-11-22 DIAGNOSIS — I700 Atherosclerosis of aorta: Secondary | ICD-10-CM | POA: Diagnosis not present

## 2023-11-22 DIAGNOSIS — R42 Dizziness and giddiness: Secondary | ICD-10-CM | POA: Diagnosis not present

## 2023-11-22 DIAGNOSIS — I2510 Atherosclerotic heart disease of native coronary artery without angina pectoris: Secondary | ICD-10-CM | POA: Diagnosis not present
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
## 2023-11-22 NOTE — Patient Instructions (Signed)
 Medication Instructions:  Cymbalta - 30mg  AM, 60mg  PM Metoprolol - PM  NO CHANGES  Testing/Procedures: Carotid Doppler at [facility]   Follow-Up: At [facility], you and your health needs are our priority.  As part of our continuing mission to provide you with exceptional heart care, we have created designated Provider Care Teams.  These Care Teams include your primary Cardiologist (physician) and Advanced Practice Providers (APPs -  Physician Assistants and Nurse Practitioners) who all work together to provide you with the care you need, when you need it.  We recommend signing up for the patient portal called "MyChart".  Sign up information is provided on this After Visit Summary.  MyChart is used to connect with patients for Virtual Visits (Telemedicine).  Patients are able to view lab/test results, encounter notes, upcoming appointments, etc.  Non-urgent messages can be sent to your provider as well.   To learn more about what you can do with MyChart, go to [email or web address].    Your next appointment:   6 months with NP or PA ** call in April or May for an August appointment

## 2023-12-16 ENCOUNTER — Ambulatory Visit (INDEPENDENT_AMBULATORY_CARE_PROVIDER_SITE_OTHER): Payer: Medicare Other

## 2023-12-16 DIAGNOSIS — I2510 Atherosclerotic heart disease of native coronary artery without angina pectoris: Secondary | ICD-10-CM

## 2023-12-16 DIAGNOSIS — R42 Dizziness and giddiness: Secondary | ICD-10-CM

## 2023-12-27 DIAGNOSIS — Z08 Encounter for follow-up examination after completed treatment for malignant neoplasm: Secondary | ICD-10-CM | POA: Diagnosis not present

## 2023-12-27 DIAGNOSIS — L821 Other seborrheic keratosis: Secondary | ICD-10-CM | POA: Diagnosis not present

## 2023-12-27 DIAGNOSIS — D044 Carcinoma in situ of skin of scalp and neck: Secondary | ICD-10-CM | POA: Diagnosis not present

## 2023-12-27 DIAGNOSIS — Z85828 Personal history of other malignant neoplasm of skin: Secondary | ICD-10-CM | POA: Diagnosis not present

## 2023-12-27 DIAGNOSIS — L814 Other melanin hyperpigmentation: Secondary | ICD-10-CM | POA: Diagnosis not present

## 2023-12-27 DIAGNOSIS — D225 Melanocytic nevi of trunk: Secondary | ICD-10-CM | POA: Diagnosis not present

## 2023-12-27 DIAGNOSIS — D485 Neoplasm of uncertain behavior of skin: Secondary | ICD-10-CM | POA: Diagnosis not present

## 2024-01-03 ENCOUNTER — Encounter: Payer: Self-pay | Admitting: Internal Medicine

## 2024-01-03 ENCOUNTER — Ambulatory Visit: Payer: Medicare Other | Admitting: Internal Medicine

## 2024-01-03 VITALS — BP 122/78 | HR 63 | Ht 72.0 in | Wt 227.4 lb

## 2024-01-03 DIAGNOSIS — Z720 Tobacco use: Secondary | ICD-10-CM

## 2024-01-03 DIAGNOSIS — J849 Interstitial pulmonary disease, unspecified: Secondary | ICD-10-CM

## 2024-01-03 DIAGNOSIS — J439 Emphysema, unspecified: Secondary | ICD-10-CM

## 2024-01-03 MED ORDER — TRELEGY ELLIPTA 100-62.5-25 MCG/ACT IN AEPB: 1.0000 | INHALATION_SPRAY | Freq: Every day | RESPIRATORY_TRACT | Status: AC
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
## 2024-01-03 NOTE — Patient Instructions (Addendum)
 Pulmonary emphysema, unspecified emphysema type (HCC) ILD (interstitial lung disease) (HCC) Cigar smoker  Overall stble CT chest since 2018-> dec 2024 Due to ongoing intermittent cigar smoking continue to be at risk for lung cancer but currently there is no lung cancer either. Glad Trelegy is helping you but noted you are finding it expensive  Plan  -  continue Trelegy  as before with albuterol as needed  -Take sample  - talk to insurance plan for cheapert alertiatnvie which are   - Breztri OR (Spiriva  + Breo) OR (Spiriva + Advair) OR (Spiriva + Symbicort)  - do  CT chest without contrast Jan 2026  - QUit smoking  Dizziness  - per PCP   Follow-up -Return in 9  months  to see Dr. Marchelle Gearing after CT   - symptom score and walk test during following

## 2024-01-09 ENCOUNTER — Telehealth: Payer: Self-pay | Admitting: Pharmacy Technician

## 2024-01-09 NOTE — Telephone Encounter (Signed)
 Pharmacy Patient Advocate Encounter  Received notification from [facility] that Prior Authorization for nexlizet has been APPROVED from 09/28/23 to 09/26/24  Just got 01/08/24   PA #/Case ID/Reference #: [id number]

## 2024-01-20 DIAGNOSIS — D044 Carcinoma in situ of skin of scalp and neck: Secondary | ICD-10-CM | POA: Diagnosis not present

## 2024-03-23 DIAGNOSIS — Z79899 Other long term (current) drug therapy: Secondary | ICD-10-CM | POA: Diagnosis not present

## 2024-03-23 DIAGNOSIS — J439 Emphysema, unspecified: Secondary | ICD-10-CM | POA: Diagnosis not present

## 2024-03-23 DIAGNOSIS — R5383 Other fatigue: Secondary | ICD-10-CM | POA: Diagnosis not present

## 2024-03-23 DIAGNOSIS — I10 Essential (primary) hypertension: Secondary | ICD-10-CM | POA: Diagnosis not present

## 2024-03-23 DIAGNOSIS — G459 Transient cerebral ischemic attack, unspecified: Secondary | ICD-10-CM | POA: Diagnosis not present

## 2024-03-23 DIAGNOSIS — M792 Neuralgia and neuritis, unspecified: Secondary | ICD-10-CM | POA: Diagnosis not present

## 2024-03-23 DIAGNOSIS — R251 Tremor, unspecified: Secondary | ICD-10-CM | POA: Diagnosis not present

## 2024-03-23 DIAGNOSIS — Z0000 Encounter for general adult medical examination without abnormal findings: Secondary | ICD-10-CM | POA: Diagnosis not present

## 2024-03-23 DIAGNOSIS — J849 Interstitial pulmonary disease, unspecified: Secondary | ICD-10-CM | POA: Diagnosis not present

## 2024-03-23 DIAGNOSIS — E559 Vitamin D deficiency, unspecified: Secondary | ICD-10-CM | POA: Diagnosis not present

## 2024-03-23 DIAGNOSIS — E782 Mixed hyperlipidemia: Secondary | ICD-10-CM | POA: Diagnosis not present

## 2024-03-23 DIAGNOSIS — R7303 Prediabetes: Secondary | ICD-10-CM | POA: Diagnosis not present

## 2024-03-23 DIAGNOSIS — I77810 Thoracic aortic ectasia: Secondary | ICD-10-CM | POA: Diagnosis not present

## 2024-05-18 ENCOUNTER — Other Ambulatory Visit: Payer: Self-pay | Admitting: Internal Medicine

## 2024-06-06 ENCOUNTER — Other Ambulatory Visit: Payer: Self-pay | Admitting: Internal Medicine

## 2024-06-13 DIAGNOSIS — H52223 Regular astigmatism, bilateral: Secondary | ICD-10-CM | POA: Diagnosis not present

## 2024-06-13 DIAGNOSIS — H35372 Puckering of macula, left eye: Secondary | ICD-10-CM | POA: Diagnosis not present

## 2024-06-15 ENCOUNTER — Other Ambulatory Visit (HOSPITAL_BASED_OUTPATIENT_CLINIC_OR_DEPARTMENT_OTHER): Payer: Self-pay | Admitting: Internal Medicine

## 2024-06-15 DIAGNOSIS — R296 Repeated falls: Secondary | ICD-10-CM | POA: Diagnosis not present

## 2024-06-15 DIAGNOSIS — R251 Tremor, unspecified: Secondary | ICD-10-CM | POA: Diagnosis not present

## 2024-06-15 DIAGNOSIS — I10 Essential (primary) hypertension: Secondary | ICD-10-CM | POA: Diagnosis not present

## 2024-06-15 DIAGNOSIS — R42 Dizziness and giddiness: Secondary | ICD-10-CM | POA: Diagnosis not present

## 2024-06-15 DIAGNOSIS — J849 Interstitial pulmonary disease, unspecified: Secondary | ICD-10-CM | POA: Diagnosis not present

## 2024-06-15 DIAGNOSIS — Z8679 Personal history of other diseases of the circulatory system: Secondary | ICD-10-CM

## 2024-06-15 DIAGNOSIS — G459 Transient cerebral ischemic attack, unspecified: Secondary | ICD-10-CM | POA: Diagnosis not present

## 2024-06-18 ENCOUNTER — Ambulatory Visit (HOSPITAL_COMMUNITY)
Admission: RE | Admit: 2024-06-18 | Discharge: 2024-06-18 | Disposition: A | Discharge status: Home / Self Care | Source: Ambulatory Visit | Attending: Internal Medicine | Admitting: Internal Medicine

## 2024-06-18 DIAGNOSIS — R42 Dizziness and giddiness: Secondary | ICD-10-CM | POA: Diagnosis not present

## 2024-06-18 DIAGNOSIS — R296 Repeated falls: Secondary | ICD-10-CM | POA: Insufficient documentation

## 2024-06-18 DIAGNOSIS — Z8679 Personal history of other diseases of the circulatory system: Secondary | ICD-10-CM | POA: Insufficient documentation

## 2024-06-18 DIAGNOSIS — S065X0A Traumatic subdural hemorrhage without loss of consciousness, initial encounter: Secondary | ICD-10-CM | POA: Diagnosis not present

## 2024-06-24 ENCOUNTER — Other Ambulatory Visit: Payer: Self-pay | Admitting: Internal Medicine

## 2024-06-27 DIAGNOSIS — L905 Scar conditions and fibrosis of skin: Secondary | ICD-10-CM | POA: Diagnosis not present

## 2024-06-27 DIAGNOSIS — D1801 Hemangioma of skin and subcutaneous tissue: Secondary | ICD-10-CM | POA: Diagnosis not present

## 2024-06-27 DIAGNOSIS — Z85828 Personal history of other malignant neoplasm of skin: Secondary | ICD-10-CM | POA: Diagnosis not present

## 2024-06-27 DIAGNOSIS — L814 Other melanin hyperpigmentation: Secondary | ICD-10-CM | POA: Diagnosis not present

## 2024-06-27 DIAGNOSIS — L821 Other seborrheic keratosis: Secondary | ICD-10-CM | POA: Diagnosis not present

## 2024-06-27 DIAGNOSIS — Z08 Encounter for follow-up examination after completed treatment for malignant neoplasm: Secondary | ICD-10-CM | POA: Diagnosis not present

## 2024-06-27 DIAGNOSIS — L720 Epidermal cyst: Secondary | ICD-10-CM | POA: Diagnosis not present

## 2024-07-11 ENCOUNTER — Other Ambulatory Visit: Payer: Self-pay | Admitting: Neurology

## 2024-07-11 DIAGNOSIS — G459 Transient cerebral ischemic attack, unspecified: Secondary | ICD-10-CM | POA: Diagnosis not present

## 2024-07-11 DIAGNOSIS — Z79899 Other long term (current) drug therapy: Secondary | ICD-10-CM | POA: Diagnosis not present

## 2024-07-11 DIAGNOSIS — R5383 Other fatigue: Secondary | ICD-10-CM | POA: Diagnosis not present

## 2024-07-11 DIAGNOSIS — Z23 Encounter for immunization: Secondary | ICD-10-CM | POA: Diagnosis not present

## 2024-07-11 DIAGNOSIS — R251 Tremor, unspecified: Secondary | ICD-10-CM | POA: Diagnosis not present

## 2024-07-11 DIAGNOSIS — R42 Dizziness and giddiness: Secondary | ICD-10-CM | POA: Diagnosis not present

## 2024-07-11 DIAGNOSIS — I10 Essential (primary) hypertension: Secondary | ICD-10-CM | POA: Diagnosis not present

## 2024-07-11 DIAGNOSIS — J849 Interstitial pulmonary disease, unspecified: Secondary | ICD-10-CM | POA: Diagnosis not present

## 2024-07-11 NOTE — Telephone Encounter (Signed)
 Dr.Yan on 07/20/23  Last seen on 06/21/23 (office visit) Follow up scheduled on 10/08/24

## 2024-07-18 DIAGNOSIS — H02824 Cysts of left upper eyelid: Secondary | ICD-10-CM | POA: Diagnosis not present

## 2024-10-04 NOTE — Patient Instructions (Incomplete)
 Below is our plan:  We will continue levetiracetam  500mg  twice daily. Consider weaning in the future. Continue close follow up with your PCP for concerns of orthostatic lightheadedness. Consider limiting alcohol  intake to 1 drink per night.   Please make sure you are consistent with timing of seizure medication. I recommend annual visit with primary care provider (PCP) for complete physical and routine blood work. I recommend daily intake of vitamin D  (400-800iu) and calcium (800-1000mg ) for bone health. Discuss Dexa screening with PCP.   According to [place] law, you can not drive unless you are seizure / syncope free for at least 6 months and under physician's care.  Please maintain precautions. Do not participate in activities where a loss of awareness could harm you or someone else. No swimming alone, no tub bathing, no hot tubs, no driving, no operating motorized vehicles (cars, ATVs, motocycles, etc), lawnmowers, power tools or firearms. No standing at heights, such as rooftops, ladders or stairs. Avoid hot objects such as stoves, heaters, open fires. Wear a helmet when riding a bicycle, scooter, skateboard, etc. and avoid areas of traffic. Set your water heater to 120 degrees or less.  SUDEP is the sudden, unexpected death of someone with epilepsy, who was otherwise healthy. In SUDEP cases, no other cause of death is found when an autopsy is done. Each year, more than 1 in 1,000 people with epilepsy die from SUDEP. This is the leading cause of death in people with uncontrolled seizures. Until further answers are available, the best way to prevent SUDEP is to lower your risk by controlling seizures. Research has found that people with all types of epilepsy that experience convulsive seizures can be at risk.  Please make sure you are staying well hydrated. I recommend 50-60 ounces daily. Well balanced diet and regular exercise encouraged. Consistent sleep schedule with 6-8 hours recommended.   Please  continue follow up with care team as directed.   Follow up with me in 1 year   You may receive a survey regarding today's visit. I encourage you to leave honest feed back as I do use this information to improve patient care. Thank you for seeing me today!
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
## 2024-10-08 ENCOUNTER — Encounter: Payer: Self-pay | Admitting: Family Medicine

## 2024-10-08 ENCOUNTER — Ambulatory Visit: Payer: Medicare Other | Admitting: Family Medicine

## 2024-10-08 VITALS — BP 158/95 | HR 55 | Ht 72.0 in | Wt 216.5 lb

## 2024-10-08 DIAGNOSIS — S065XAA Traumatic subdural hemorrhage with loss of consciousness status unknown, initial encounter: Secondary | ICD-10-CM | POA: Diagnosis not present

## 2024-10-08 DIAGNOSIS — G459 Transient cerebral ischemic attack, unspecified: Secondary | ICD-10-CM | POA: Insufficient documentation

## 2024-10-08 DIAGNOSIS — Z1389 Encounter for screening for other disorder: Secondary | ICD-10-CM | POA: Insufficient documentation

## 2024-10-08 DIAGNOSIS — N400 Enlarged prostate without lower urinary tract symptoms: Secondary | ICD-10-CM | POA: Insufficient documentation

## 2024-10-08 DIAGNOSIS — I700 Atherosclerosis of aorta: Secondary | ICD-10-CM | POA: Insufficient documentation

## 2024-10-08 DIAGNOSIS — R42 Dizziness and giddiness: Secondary | ICD-10-CM | POA: Diagnosis not present

## 2024-10-08 DIAGNOSIS — M792 Neuralgia and neuritis, unspecified: Secondary | ICD-10-CM | POA: Insufficient documentation

## 2024-10-08 DIAGNOSIS — R7303 Prediabetes: Secondary | ICD-10-CM | POA: Insufficient documentation

## 2024-10-08 DIAGNOSIS — Z789 Other specified health status: Secondary | ICD-10-CM | POA: Insufficient documentation

## 2024-10-08 DIAGNOSIS — D7589 Other specified diseases of blood and blood-forming organs: Secondary | ICD-10-CM | POA: Insufficient documentation

## 2024-10-08 DIAGNOSIS — R269 Unspecified abnormalities of gait and mobility: Secondary | ICD-10-CM

## 2024-10-08 DIAGNOSIS — K219 Gastro-esophageal reflux disease without esophagitis: Secondary | ICD-10-CM | POA: Insufficient documentation

## 2024-10-08 DIAGNOSIS — E782 Mixed hyperlipidemia: Secondary | ICD-10-CM | POA: Insufficient documentation

## 2024-10-08 DIAGNOSIS — F329 Major depressive disorder, single episode, unspecified: Secondary | ICD-10-CM | POA: Insufficient documentation

## 2024-10-08 DIAGNOSIS — G629 Polyneuropathy, unspecified: Secondary | ICD-10-CM | POA: Diagnosis not present

## 2024-10-08 DIAGNOSIS — R351 Nocturia: Secondary | ICD-10-CM | POA: Insufficient documentation

## 2024-10-08 DIAGNOSIS — R251 Tremor, unspecified: Secondary | ICD-10-CM | POA: Insufficient documentation

## 2024-10-08 DIAGNOSIS — I161 Hypertensive emergency: Secondary | ICD-10-CM | POA: Insufficient documentation

## 2024-10-08 MED ORDER — LEVETIRACETAM 500 MG PO TABS: 500.0000 mg | ORAL_TABLET | Freq: Two times a day (BID) | ORAL | 3 refills | Status: AC

## 2024-10-18 ENCOUNTER — Telehealth: Payer: Self-pay | Admitting: Family Medicine

## 2024-10-18 NOTE — Telephone Encounter (Signed)
 Patient asking for a call back to discuss if Amy, NP discuss with Dr Onita what my problems were discussed at office visit.  Would like a call back

## 2024-10-18 NOTE — Telephone Encounter (Signed)
 Called pt and let him know that I am not certain if NP Amy Lomax and Dr. Onita had spoke about his appointment on 10/08/24, told pt that NP Amy Lomax isn't in office today and will be returning Monday Morning.

## 2024-10-22 ENCOUNTER — Ambulatory Visit (HOSPITAL_BASED_OUTPATIENT_CLINIC_OR_DEPARTMENT_OTHER)

## 2024-10-31 ENCOUNTER — Ambulatory Visit (HOSPITAL_BASED_OUTPATIENT_CLINIC_OR_DEPARTMENT_OTHER): Admission: RE | Admit: 2024-10-31 | Discharge: 2024-10-31 | Disposition: A | Source: Ambulatory Visit

## 2024-10-31 ENCOUNTER — Ambulatory Visit (HOSPITAL_BASED_OUTPATIENT_CLINIC_OR_DEPARTMENT_OTHER)
Admission: RE | Admit: 2024-10-31 | Discharge: 2024-10-31 | Disposition: A | Source: Ambulatory Visit | Attending: Internal Medicine | Admitting: Internal Medicine

## 2024-10-31 ENCOUNTER — Other Ambulatory Visit (HOSPITAL_BASED_OUTPATIENT_CLINIC_OR_DEPARTMENT_OTHER): Payer: Self-pay | Admitting: Internal Medicine

## 2024-10-31 DIAGNOSIS — J439 Emphysema, unspecified: Secondary | ICD-10-CM

## 2024-10-31 DIAGNOSIS — R296 Repeated falls: Secondary | ICD-10-CM

## 2024-10-31 DIAGNOSIS — J849 Interstitial pulmonary disease, unspecified: Secondary | ICD-10-CM

## 2024-10-31 DIAGNOSIS — Z8679 Personal history of other diseases of the circulatory system: Secondary | ICD-10-CM

## 2024-10-31 DIAGNOSIS — Z720 Tobacco use: Secondary | ICD-10-CM

## 2024-11-08 ENCOUNTER — Ambulatory Visit: Admitting: Internal Medicine

## 2024-12-10 ENCOUNTER — Ambulatory Visit: Admitting: Internal Medicine

## 2025-10-15 ENCOUNTER — Ambulatory Visit: Admitting: Neurology
